# Patient Record
Sex: Female | Born: 1935 | Race: White | Hispanic: No | Smoking: Former smoker
Health system: Southern US, Community
[De-identification: ages and names within clinical notes are randomized; demographics above are authoritative.]

## PROBLEM LIST (undated history)

## (undated) DIAGNOSIS — K317 Polyp of stomach and duodenum: Secondary | ICD-10-CM

## (undated) DIAGNOSIS — K648 Other hemorrhoids: Secondary | ICD-10-CM

## (undated) DIAGNOSIS — E669 Obesity, unspecified: Secondary | ICD-10-CM

## (undated) DIAGNOSIS — K635 Polyp of colon: Secondary | ICD-10-CM

## (undated) DIAGNOSIS — Z950 Presence of cardiac pacemaker: Secondary | ICD-10-CM

## (undated) DIAGNOSIS — K5790 Diverticulosis of intestine, part unspecified, without perforation or abscess without bleeding: Secondary | ICD-10-CM

## (undated) DIAGNOSIS — N182 Chronic kidney disease, stage 2 (mild): Secondary | ICD-10-CM

## (undated) DIAGNOSIS — C689 Malignant neoplasm of urinary organ, unspecified: Secondary | ICD-10-CM

## (undated) DIAGNOSIS — I495 Sick sinus syndrome: Secondary | ICD-10-CM

## (undated) DIAGNOSIS — F32A Depression, unspecified: Secondary | ICD-10-CM

## (undated) DIAGNOSIS — N1330 Unspecified hydronephrosis: Secondary | ICD-10-CM

## (undated) DIAGNOSIS — I2510 Atherosclerotic heart disease of native coronary artery without angina pectoris: Secondary | ICD-10-CM

## (undated) DIAGNOSIS — R0600 Dyspnea, unspecified: Secondary | ICD-10-CM

## (undated) DIAGNOSIS — G43909 Migraine, unspecified, not intractable, without status migrainosus: Secondary | ICD-10-CM

## (undated) DIAGNOSIS — K279 Peptic ulcer, site unspecified, unspecified as acute or chronic, without hemorrhage or perforation: Secondary | ICD-10-CM

## (undated) DIAGNOSIS — I10 Essential (primary) hypertension: Secondary | ICD-10-CM

## (undated) DIAGNOSIS — R5383 Other fatigue: Secondary | ICD-10-CM

## (undated) DIAGNOSIS — I509 Heart failure, unspecified: Secondary | ICD-10-CM

## (undated) DIAGNOSIS — E119 Type 2 diabetes mellitus without complications: Secondary | ICD-10-CM

## (undated) DIAGNOSIS — K219 Gastro-esophageal reflux disease without esophagitis: Secondary | ICD-10-CM

## (undated) DIAGNOSIS — C541 Malignant neoplasm of endometrium: Secondary | ICD-10-CM

## (undated) DIAGNOSIS — I4891 Unspecified atrial fibrillation: Secondary | ICD-10-CM

## (undated) DIAGNOSIS — Z8719 Personal history of other diseases of the digestive system: Secondary | ICD-10-CM

## (undated) DIAGNOSIS — E1110 Type 2 diabetes mellitus with ketoacidosis without coma: Secondary | ICD-10-CM

## (undated) DIAGNOSIS — I209 Angina pectoris, unspecified: Secondary | ICD-10-CM

## (undated) DIAGNOSIS — E7800 Pure hypercholesterolemia, unspecified: Secondary | ICD-10-CM

## (undated) DIAGNOSIS — I82409 Acute embolism and thrombosis of unspecified deep veins of unspecified lower extremity: Secondary | ICD-10-CM

## (undated) DIAGNOSIS — M1990 Unspecified osteoarthritis, unspecified site: Secondary | ICD-10-CM

## (undated) DIAGNOSIS — D649 Anemia, unspecified: Secondary | ICD-10-CM

## (undated) DIAGNOSIS — F329 Major depressive disorder, single episode, unspecified: Secondary | ICD-10-CM

## (undated) DIAGNOSIS — G8929 Other chronic pain: Secondary | ICD-10-CM

## (undated) DIAGNOSIS — M549 Dorsalgia, unspecified: Secondary | ICD-10-CM

## (undated) DIAGNOSIS — J189 Pneumonia, unspecified organism: Secondary | ICD-10-CM

## (undated) DIAGNOSIS — R0609 Other forms of dyspnea: Secondary | ICD-10-CM

## (undated) DIAGNOSIS — F439 Reaction to severe stress, unspecified: Secondary | ICD-10-CM

## (undated) DIAGNOSIS — I639 Cerebral infarction, unspecified: Secondary | ICD-10-CM

## (undated) DIAGNOSIS — Z8679 Personal history of other diseases of the circulatory system: Secondary | ICD-10-CM

## (undated) HISTORY — DX: Obesity, unspecified: E66.9

## (undated) HISTORY — DX: Reaction to severe stress, unspecified: F43.9

## (undated) HISTORY — DX: Diverticulosis of intestine, part unspecified, without perforation or abscess without bleeding: K57.90

## (undated) HISTORY — DX: Other fatigue: R53.83

## (undated) HISTORY — DX: Heart failure, unspecified: I50.9

## (undated) HISTORY — DX: Atherosclerotic heart disease of native coronary artery without angina pectoris: I25.10

## (undated) HISTORY — DX: Polyp of colon: K63.5

## (undated) HISTORY — PX: DILATION AND CURETTAGE OF UTERUS: SHX78

## (undated) HISTORY — PX: CARDIAC CATHETERIZATION: SHX172

## (undated) HISTORY — PX: TONSILLECTOMY: SUR1361

## (undated) HISTORY — PX: CHOLECYSTECTOMY: SHX55

## (undated) HISTORY — DX: Polyp of stomach and duodenum: K31.7

## (undated) HISTORY — DX: Unspecified atrial fibrillation: I48.91

## (undated) HISTORY — DX: Peptic ulcer, site unspecified, unspecified as acute or chronic, without hemorrhage or perforation: K27.9

## (undated) HISTORY — PX: JOINT REPLACEMENT: SHX530

## (undated) HISTORY — DX: Sick sinus syndrome: I49.5

## (undated) HISTORY — DX: Essential (primary) hypertension: I10

---

## 1958-11-27 DIAGNOSIS — I82409 Acute embolism and thrombosis of unspecified deep veins of unspecified lower extremity: Secondary | ICD-10-CM

## 1958-11-27 HISTORY — DX: Acute embolism and thrombosis of unspecified deep veins of unspecified lower extremity: I82.409

## 1983-03-29 DIAGNOSIS — K279 Peptic ulcer, site unspecified, unspecified as acute or chronic, without hemorrhage or perforation: Secondary | ICD-10-CM

## 1983-03-29 HISTORY — DX: Peptic ulcer, site unspecified, unspecified as acute or chronic, without hemorrhage or perforation: K27.9

## 1993-03-28 DIAGNOSIS — C541 Malignant neoplasm of endometrium: Secondary | ICD-10-CM

## 1993-03-28 HISTORY — PX: VESICOVAGINAL FISTULA CLOSURE W/ TAH: SUR271

## 1993-03-28 HISTORY — PX: ABDOMINAL HYSTERECTOMY: SHX81

## 1993-03-28 HISTORY — DX: Malignant neoplasm of endometrium: C54.1

## 1997-10-03 ENCOUNTER — Ambulatory Visit (HOSPITAL_COMMUNITY): Admission: RE | Admit: 1997-10-03 | Discharge: 1997-10-03 | Payer: Self-pay | Admitting: Obstetrics and Gynecology

## 1997-11-12 ENCOUNTER — Ambulatory Visit: Admission: RE | Admit: 1997-11-12 | Discharge: 1997-11-12 | Payer: Self-pay | Admitting: Gynecology

## 1998-05-22 ENCOUNTER — Other Ambulatory Visit: Admission: RE | Admit: 1998-05-22 | Discharge: 1998-05-22 | Payer: Self-pay | Admitting: Obstetrics and Gynecology

## 1998-10-30 ENCOUNTER — Encounter: Payer: Self-pay | Admitting: Obstetrics and Gynecology

## 1998-10-30 ENCOUNTER — Ambulatory Visit (HOSPITAL_COMMUNITY): Admission: RE | Admit: 1998-10-30 | Discharge: 1998-10-30 | Payer: Self-pay | Admitting: Obstetrics and Gynecology

## 1998-11-24 ENCOUNTER — Ambulatory Visit: Admission: RE | Admit: 1998-11-24 | Discharge: 1998-11-24 | Payer: Self-pay | Admitting: Gynecology

## 1998-11-24 ENCOUNTER — Other Ambulatory Visit: Admission: RE | Admit: 1998-11-24 | Discharge: 1998-11-24 | Payer: Self-pay | Admitting: Gynecology

## 1999-02-01 ENCOUNTER — Encounter: Admission: RE | Admit: 1999-02-01 | Discharge: 1999-05-02 | Payer: Self-pay | Admitting: Internal Medicine

## 1999-05-31 ENCOUNTER — Other Ambulatory Visit: Admission: RE | Admit: 1999-05-31 | Discharge: 1999-05-31 | Payer: Self-pay | Admitting: Obstetrics and Gynecology

## 1999-07-23 ENCOUNTER — Ambulatory Visit (HOSPITAL_COMMUNITY): Admission: RE | Admit: 1999-07-23 | Discharge: 1999-07-24 | Payer: Self-pay | Admitting: Internal Medicine

## 1999-07-23 ENCOUNTER — Encounter: Payer: Self-pay | Admitting: Internal Medicine

## 1999-07-23 HISTORY — PX: INSERT / REPLACE / REMOVE PACEMAKER: SUR710

## 1999-07-24 ENCOUNTER — Encounter: Payer: Self-pay | Admitting: Internal Medicine

## 1999-10-06 ENCOUNTER — Encounter: Payer: Self-pay | Admitting: Internal Medicine

## 1999-10-06 ENCOUNTER — Encounter: Admission: RE | Admit: 1999-10-06 | Discharge: 1999-10-06 | Payer: Self-pay | Admitting: Internal Medicine

## 1999-11-04 ENCOUNTER — Ambulatory Visit (HOSPITAL_COMMUNITY): Admission: RE | Admit: 1999-11-04 | Discharge: 1999-11-05 | Payer: Self-pay | Admitting: Surgery

## 1999-12-24 ENCOUNTER — Ambulatory Visit (HOSPITAL_COMMUNITY): Admission: RE | Admit: 1999-12-24 | Discharge: 1999-12-24 | Payer: Self-pay | Admitting: Obstetrics and Gynecology

## 1999-12-24 ENCOUNTER — Encounter: Payer: Self-pay | Admitting: Obstetrics and Gynecology

## 2000-06-12 ENCOUNTER — Other Ambulatory Visit: Admission: RE | Admit: 2000-06-12 | Discharge: 2000-06-12 | Payer: Self-pay | Admitting: Obstetrics and Gynecology

## 2001-02-23 ENCOUNTER — Encounter: Payer: Self-pay | Admitting: Obstetrics and Gynecology

## 2001-02-23 ENCOUNTER — Ambulatory Visit (HOSPITAL_COMMUNITY): Admission: RE | Admit: 2001-02-23 | Discharge: 2001-02-23 | Payer: Self-pay | Admitting: Obstetrics and Gynecology

## 2001-07-23 ENCOUNTER — Other Ambulatory Visit: Admission: RE | Admit: 2001-07-23 | Discharge: 2001-07-23 | Payer: Self-pay | Admitting: Obstetrics and Gynecology

## 2002-02-22 ENCOUNTER — Encounter: Payer: Self-pay | Admitting: Obstetrics and Gynecology

## 2002-02-22 ENCOUNTER — Ambulatory Visit (HOSPITAL_COMMUNITY): Admission: RE | Admit: 2002-02-22 | Discharge: 2002-02-22 | Payer: Self-pay | Admitting: Obstetrics and Gynecology

## 2002-08-15 ENCOUNTER — Other Ambulatory Visit: Admission: RE | Admit: 2002-08-15 | Discharge: 2002-08-15 | Payer: Self-pay | Admitting: Obstetrics and Gynecology

## 2003-03-25 ENCOUNTER — Ambulatory Visit (HOSPITAL_COMMUNITY): Admission: RE | Admit: 2003-03-25 | Discharge: 2003-03-25 | Payer: Self-pay | Admitting: Obstetrics and Gynecology

## 2003-08-18 ENCOUNTER — Other Ambulatory Visit: Admission: RE | Admit: 2003-08-18 | Discharge: 2003-08-18 | Payer: Self-pay | Admitting: Obstetrics and Gynecology

## 2003-08-27 ENCOUNTER — Encounter: Admission: RE | Admit: 2003-08-27 | Discharge: 2003-08-27 | Payer: Self-pay | Admitting: Orthopaedic Surgery

## 2004-03-30 ENCOUNTER — Ambulatory Visit (HOSPITAL_COMMUNITY): Admission: RE | Admit: 2004-03-30 | Discharge: 2004-03-30 | Payer: Self-pay | Admitting: Obstetrics and Gynecology

## 2004-10-11 ENCOUNTER — Other Ambulatory Visit: Admission: RE | Admit: 2004-10-11 | Discharge: 2004-10-11 | Payer: Self-pay | Admitting: Obstetrics and Gynecology

## 2004-10-14 ENCOUNTER — Emergency Department (HOSPITAL_COMMUNITY): Admission: EM | Admit: 2004-10-14 | Discharge: 2004-10-15 | Payer: Self-pay | Admitting: Emergency Medicine

## 2005-04-11 ENCOUNTER — Ambulatory Visit (HOSPITAL_COMMUNITY): Admission: RE | Admit: 2005-04-11 | Discharge: 2005-04-11 | Payer: Self-pay | Admitting: Obstetrics and Gynecology

## 2005-12-05 ENCOUNTER — Other Ambulatory Visit: Admission: RE | Admit: 2005-12-05 | Discharge: 2005-12-05 | Payer: Self-pay | Admitting: Obstetrics & Gynecology

## 2006-04-20 ENCOUNTER — Ambulatory Visit (HOSPITAL_COMMUNITY): Admission: RE | Admit: 2006-04-20 | Discharge: 2006-04-20 | Payer: Self-pay | Admitting: Obstetrics & Gynecology

## 2007-03-30 ENCOUNTER — Other Ambulatory Visit: Admission: RE | Admit: 2007-03-30 | Discharge: 2007-03-30 | Payer: Self-pay | Admitting: Obstetrics and Gynecology

## 2007-04-25 ENCOUNTER — Ambulatory Visit (HOSPITAL_COMMUNITY): Admission: RE | Admit: 2007-04-25 | Discharge: 2007-04-25 | Payer: Self-pay | Admitting: Obstetrics & Gynecology

## 2007-06-27 ENCOUNTER — Ambulatory Visit: Payer: Self-pay | Admitting: Internal Medicine

## 2007-08-22 ENCOUNTER — Telehealth: Payer: Self-pay | Admitting: Internal Medicine

## 2007-08-22 DIAGNOSIS — K625 Hemorrhage of anus and rectum: Secondary | ICD-10-CM | POA: Insufficient documentation

## 2007-08-22 DIAGNOSIS — R1319 Other dysphagia: Secondary | ICD-10-CM

## 2007-08-27 ENCOUNTER — Telehealth (INDEPENDENT_AMBULATORY_CARE_PROVIDER_SITE_OTHER): Payer: Self-pay | Admitting: *Deleted

## 2007-09-17 ENCOUNTER — Ambulatory Visit: Payer: Self-pay | Admitting: Internal Medicine

## 2007-09-25 ENCOUNTER — Encounter: Payer: Self-pay | Admitting: Internal Medicine

## 2007-09-25 ENCOUNTER — Ambulatory Visit (HOSPITAL_COMMUNITY): Admission: RE | Admit: 2007-09-25 | Discharge: 2007-09-25 | Payer: Self-pay | Admitting: Internal Medicine

## 2007-09-25 ENCOUNTER — Ambulatory Visit: Payer: Self-pay | Admitting: Internal Medicine

## 2007-09-26 ENCOUNTER — Encounter: Payer: Self-pay | Admitting: Internal Medicine

## 2007-09-27 ENCOUNTER — Encounter: Payer: Self-pay | Admitting: Internal Medicine

## 2008-02-26 HISTORY — PX: TOTAL HIP ARTHROPLASTY: SHX124

## 2008-04-22 ENCOUNTER — Inpatient Hospital Stay (HOSPITAL_COMMUNITY): Admission: RE | Admit: 2008-04-22 | Discharge: 2008-04-25 | Payer: Self-pay | Admitting: Orthopedic Surgery

## 2008-12-17 ENCOUNTER — Ambulatory Visit (HOSPITAL_COMMUNITY): Admission: RE | Admit: 2008-12-17 | Discharge: 2008-12-17 | Payer: Self-pay | Admitting: Obstetrics & Gynecology

## 2009-02-25 ENCOUNTER — Encounter: Payer: Self-pay | Admitting: Internal Medicine

## 2009-03-04 ENCOUNTER — Ambulatory Visit: Payer: Self-pay | Admitting: Internal Medicine

## 2009-03-04 DIAGNOSIS — Z860100 Personal history of colon polyps, unspecified: Secondary | ICD-10-CM | POA: Insufficient documentation

## 2009-03-04 DIAGNOSIS — K219 Gastro-esophageal reflux disease without esophagitis: Secondary | ICD-10-CM

## 2009-03-04 DIAGNOSIS — D689 Coagulation defect, unspecified: Secondary | ICD-10-CM

## 2009-03-04 DIAGNOSIS — D509 Iron deficiency anemia, unspecified: Secondary | ICD-10-CM | POA: Insufficient documentation

## 2009-03-04 DIAGNOSIS — E119 Type 2 diabetes mellitus without complications: Secondary | ICD-10-CM | POA: Insufficient documentation

## 2009-03-24 ENCOUNTER — Ambulatory Visit (HOSPITAL_COMMUNITY): Admission: RE | Admit: 2009-03-24 | Discharge: 2009-03-24 | Payer: Self-pay | Admitting: Internal Medicine

## 2009-04-07 ENCOUNTER — Telehealth: Payer: Self-pay | Admitting: Internal Medicine

## 2009-05-15 ENCOUNTER — Telehealth: Payer: Self-pay | Admitting: Internal Medicine

## 2009-05-19 ENCOUNTER — Ambulatory Visit: Payer: Self-pay | Admitting: Internal Medicine

## 2009-05-19 ENCOUNTER — Ambulatory Visit (HOSPITAL_COMMUNITY): Admission: RE | Admit: 2009-05-19 | Discharge: 2009-05-19 | Payer: Self-pay | Admitting: Internal Medicine

## 2009-05-19 DIAGNOSIS — K635 Polyp of colon: Secondary | ICD-10-CM

## 2009-05-19 HISTORY — DX: Polyp of colon: K63.5

## 2009-05-21 ENCOUNTER — Encounter: Payer: Self-pay | Admitting: Internal Medicine

## 2009-12-18 ENCOUNTER — Ambulatory Visit (HOSPITAL_COMMUNITY): Admission: RE | Admit: 2009-12-18 | Discharge: 2009-12-18 | Payer: Self-pay | Admitting: Obstetrics and Gynecology

## 2009-12-21 ENCOUNTER — Encounter: Payer: Self-pay | Admitting: Internal Medicine

## 2009-12-21 ENCOUNTER — Ambulatory Visit: Payer: Self-pay | Admitting: Cardiovascular Disease

## 2010-01-05 ENCOUNTER — Encounter: Payer: Self-pay | Admitting: Internal Medicine

## 2010-03-03 ENCOUNTER — Ambulatory Visit: Payer: Self-pay | Admitting: Internal Medicine

## 2010-03-03 DIAGNOSIS — I4891 Unspecified atrial fibrillation: Secondary | ICD-10-CM

## 2010-03-03 DIAGNOSIS — Z950 Presence of cardiac pacemaker: Secondary | ICD-10-CM

## 2010-03-28 HISTORY — PX: INSERT / REPLACE / REMOVE PACEMAKER: SUR710

## 2010-04-18 ENCOUNTER — Encounter: Payer: Self-pay | Admitting: Obstetrics and Gynecology

## 2010-04-29 NOTE — Letter (Signed)
Summary: [facility] Progress Note   [facility] Progress Note   Imported By: Roderic Ovens 03/09/2010 15:46:25  _____________________________________________________________________  External Attachment:    Type:   Image     Comment:   External Document

## 2010-04-29 NOTE — Procedures (Signed)
Summary: HOLD letter/[facility]  HOLD letter/[facility]   Imported By: Lester Little Hocking 04/20/2009 13:47:01  _____________________________________________________________________  External Attachment:    Type:   Image     Comment:   External Document

## 2010-04-29 NOTE — Miscellaneous (Signed)
Summary: RECall  Clinical Lists Changes  Observations: Added new observation of COLONNXTDUE: 04/2011 (05/21/2009 15:05)

## 2010-04-29 NOTE — Letter (Signed)
Summary: Patient Notice- Polyp Results  [facility]  [address] [place], [place] [address]   Phone: [phone]  Fax: [phone]        May 21, 2009 MRN: [id number]    PREZLEY QADIR [address] [place], [place]  [address]    Dear Ms. Greenhalgh,  I am pleased to inform you that the colon polyp(s) removed during your recent colonoscopy was (were) found to be benign (no cancer detected) upon pathologic examination.  I recommend you have a repeat colonoscopy examination in 2 years to look for recurrent polyps, as having colon polyps increases your risk for having recurrent polyps or even colon cancer in the future.  Should you develop new or worsening symptoms of abdominal pain, bowel habit changes or bleeding from the rectum or bowels, please schedule an evaluation with either your primary care physician or with me.  Additional information/recommendations:  __ No further action with gastroenterology is needed at this time. Please      follow-up with your primary care physician for your other healthcare      needs.   Please call us if you are having persistent problems or have questions about your condition that have not been fully answered at this time.  Sincerely,  Hilarie Fredrickson MD  This letter has been electronically signed by your physician.  Appended Document: Patient Notice- Polyp Results   Letter mailed to pt.

## 2010-04-29 NOTE — Miscellaneous (Signed)
Summary: Device preload  Clinical Lists Changes  Observations: Added new observation of PPM INDICATN: A-flutter (01/05/2010 8:30) Added new observation of MAGNET RTE: BOL 85 ERI 65 (01/05/2010 8:30) Added new observation of PPMLEADSTAT1: active (01/05/2010 8:30) Added new observation of PPMLEADSER1: [id number] (01/05/2010 8:30) Added new observation of PPMLEADMOD1: 5092  (01/05/2010 8:30) Added new observation of PPMLEADDOI1: 07/23/1999  (01/05/2010 8:30) Added new observation of PPMLEADLOC1: RV  (01/05/2010 8:30) Added new observation of PPM IMP MD: Villa Herb  (01/05/2010 8:30) Added new observation of PPM DOI: 07/23/1999  (01/05/2010 8:30) Added new observation of PPM SERL#: [id number]  (01/05/2010 8:30) Added new observation of PPM MODL#: 303B  (01/05/2010 8:30) Added new observation of PACEMAKERMFG: Medtronic  (01/05/2010 8:30) Added new observation of PPM REFER MD: Shaune Pascal, MD  (01/05/2010 8:30) Added new observation of PACEMAKER MD: Lewayne Bunting, MD  (01/05/2010 8:30)      PPM Specifications Following MD:  Lewayne Bunting, MD     Referring MD:  Shaune Pascal, MD PPM Vendor:  Medtronic     PPM Model Number:  303B     PPM Serial Number:  [id number] PPM DOI:  07/23/1999     PPM Implanting MD:  Villa Herb  Lead 1    Location: RV     DOI: 07/23/1999     Model #: [id number]     Serial #: [id number]     Status: active  Magnet Response Rate:  BOL 85 ERI 65  Indications:  A-flutter

## 2010-04-29 NOTE — Progress Notes (Signed)
Summary: Triage  Phone Note Call from Patient Call back at Home Phone [phone]   Caller: Patient Call For: Dr. Marina Goodell Reason for Call: Talk to Nurse Summary of Call: Pt has some questions about her procedure on Tuesday...re: Insulin questions Initial call taken by: Karna Christmas,  May 15, 2009 2:38 PM  Follow-up for Phone Call         Insulin  instructions clarified for pt. and she had been on an antibiotic for UTI from Dr. Jacky Kindle  and told she needs to take as prescribed as it will not interfere with her procedure. Follow-up by: Teryl Lucy RN,  May 15, 2009 3:26 PM

## 2010-04-29 NOTE — Progress Notes (Signed)
Summary: Reschedule Hospital Procedure  Phone Note Call from Patient Call back at Home Phone [phone]   Caller: Patient Call For: Dr. Marina Goodell Reason for Call: Talk to Nurse Summary of Call: Pt is needing to reschedule her procedure at [facility] that is scheduled for tomorrow 04-08-09 Initial call taken by: Karna Christmas,  April 07, 2009 11:08 AM  Follow-up for Phone Call        Patient  is unable to get here in the am due to transportation.  She wants to reschedule for several weeks out due to her coumadin.  I will have Teryl Lucy call her back to reschedule. Follow-up by: Darcey Nora RN, CGRN,  April 07, 2009 11:18 AM  Additional Follow-up for Phone Call Additional follow up Details #1::        Pt. contacted and informed that I will r/s her for her EGD/Dil. during Dr .Lamar Sprinkles next hospital week in February and that is fine with her.   Teryl Lucy RN  April 08, 2009 11:31 AM     Additional Follow-up for Phone Call Additional follow up Details #2::    Pt.scheduled for procedure and anti-coagulation letter sent to Dr.Nashar as other letter can not be located.  Teryl Lucy RN  April 14, 2009 9:02 AM  Letter rec'd from Dr.Nasher  approving holding Coumadin 5 days prior to procedure on2/22/2011. Pt. informed. Follow-up by: Teryl Lucy RN,  April 15, 2009 9:20 AM

## 2010-04-29 NOTE — Procedures (Signed)
Summary: Colonoscopy  Patient: Chamaine Stankus Note: All result statuses are Final unless otherwise noted.  Tests: (1) Colonoscopy (COL)   COL Colonoscopy           DONE     [facility]     [address] [place], [place]  [address]           COLONOSCOPY PROCEDURE REPORT           PATIENT:  [name], [name]  MR#:  [id number]     BIRTHDATE:  09/01/1935, 73 yrs. old  GENDER:  female           ENDOSCOPIST:  Wilhemina Bonito. Eda Keys, MD     Referred by:  Geoffry Paradise, M.D.           PROCEDURE DATE:  05/19/2009     PROCEDURE:  Colonoscopy with snare polypectomy x 4     ASA CLASS:  Class III     INDICATIONS:  Iron Deficiency Anemia           MEDICATIONS:   Fentanyl 100 mcg IV, Versed 10 mg IV           DESCRIPTION OF PROCEDURE:   After the risks benefits and     alternatives of the procedure were thoroughly explained, informed     consent was obtained.  Digital rectal exam was performed and     revealed no abnormalities.   The Pentax Colonoscope [id number]     endoscope was introduced through the anus and advanced to the     cecum, which was identified by both the appendix and ileocecal     valve, limited by inability to hold air. Time to cecum = .     The quality of the prep was excellent, using MoviPrep.  The     instrument was then slowly withdrawn (T=24min) as the colon was     fully examined.     <<PROCEDUREIMAGES>>           FINDINGS:  The terminal ileum appeared normal.  Four polyps were     found in the cecum (54mm,3mm) and ascending colon (8mm, 4mm).     Polyps were snared without cautery. Retrieval was successful.     Moderate diverticulosis was found in the left colon.  A tiny     nonbleeding A.V. malformation was found in the cecum.     Retroflexed views in the rectum revealed moderate internal     hemorrhoids.    The scope was then withdrawn from the patient and     the procedure completed.           COMPLICATIONS:  None           ENDOSCOPIC IMPRESSION:   1) Normal terminal ileum     2) Four polyps - removed     3) Moderate diverticulosis in the left colon     4) Small Av malformation in the cecum (possible cause for     anemia)     5) Moderate Internal hemorrhoids           RECOMMENDATIONS:     1) Follow up colonoscopy in 2 years     2) EGD today           ______________________________     Wilhemina Bonito. Eda Keys, MD           CC:  Geoffry Paradise, MD;The Patient  n.     eSIGNED:   Wilhemina Bonito. Eda Keys at 05/19/2009 09:34 AM           Lanier Clam, [id number]  Note: An exclamation mark (!) indicates a result that was not dispersed into the flowsheet. Document Creation Date: 05/19/2009 9:35 AM _______________________________________________________________________  (1) Order result status: Final Collection or observation date-time: 05/19/2009 09:23 Requested date-time:  Receipt date-time:  Reported date-time:  Referring Physician:   Ordering Physician: Fransico Setters [id number]) Specimen Source:  Source: Launa Grill Order Number: [id number] Lab site:

## 2010-04-29 NOTE — Procedures (Signed)
Summary: Upper Endoscopy  Patient: Joyce Terry Note: All result statuses are Final unless otherwise noted.  Tests: (1) Upper Endoscopy (EGD)   EGD Upper Endoscopy       DONE     [facility]     [address] [place], [place]  [address]           ENDOSCOPY PROCEDURE REPORT           PATIENT:  Joyce Terry, Joyce Terry  MR#:  [id number]     BIRTHDATE:  03/28/1936, 73 yrs. old  GENDER:  female           ENDOSCOPIST:  Wilhemina Bonito. Eda Keys, MD     Referred by:  Geoffry Paradise, M.D.           PROCEDURE DATE:  05/19/2009     PROCEDURE:  EGD     Elease Hashimoto Dilation of Esophagus -     68F     ASA CLASS:  Class III     INDICATIONS:  iron deficiency anemia, dysphagia           MEDICATIONS:   There was residual sedation effect present from     prior procedure., Fentanyl 50 mcg, Versed 4 mg     TOPICAL ANESTHETIC:  Cetacaine Spray           DESCRIPTION OF PROCEDURE:   After the risks benefits and     alternatives of the procedure were thoroughly explained, informed     consent was obtained.  The Pentax Gastroscope [id number] endoscope     was introduced through the mouth and advanced to the second     portion of the duodenum, without limitations.  The instrument was     slowly withdrawn as the mucosa was fully examined.     <<PROCEDUREIMAGES>>           A large caliber esophageal ring was found in the distal esophagus.     The upper, middle, and distal third of the esophagus were     carefully inspected and no additional abnormalities were noted.     The z-line was well seen at the GEJ. The endoscope was pushed into     the fundus which was normal including a retroflexed view. Benign     gastric polyps in the fundus noted.The antrum,gastric body, first     and second part of the duodenum were unremarkable.    Retroflexed     views revealed no additional abnormalities.    The scope was then     withdrawn from the patient and the procedure completed.           THERAPY: MALONEY DILATION  W/ 68F DILATOR - MINIMAL RESISTANCE. NO     HEME. TOLERATED WELL           COMPLICATIONS:  None           ENDOSCOPIC IMPRESSION:     1) Ring, esophageal in the distal esophagus - S/P 68F MALONEY     DILATION     2) A FEW SMALL GASTRIC POLYPS     2) OTHERWISE Normal EGD           RECOMMENDATIONS:     1) Clear liquids until 12 NOON, then soft foods rest of day.     Resume prior diet tomorrow.     2) CONTINE IRON TWICE DAILY EVERY DAY     3) RESUME COUMADIN THERAPY TOMORROW     4)  HAVE DR ARONSON CHECK YOUR BLOOD COUNTS IN 4 WEEKS TO SEE IF     IRON IS WORKING           ______________________________     Wilhemina Bonito. Eda Keys, MD           CC:  Geoffry Paradise, MD, The Patient           n.     eSIGNED:   Wilhemina Bonito. Eda Keys at 05/19/2009 09:55 AM           Lanier Clam, [id number]  Note: An exclamation mark (!) indicates a result that was not dispersed into the flowsheet. Document Creation Date: 05/19/2009 9:56 AM _______________________________________________________________________  (1) Order result status: Final Collection or observation date-time: 05/19/2009 09:43 Requested date-time:  Receipt date-time:  Reported date-time:  Referring Physician:   Ordering Physician: Fransico Setters [phone]) Specimen Source:  Source: Launa Grill Order Number: [id number] Lab site:

## 2010-04-29 NOTE — Assessment & Plan Note (Signed)
Summary: pacer check/medtronic   Visit Type:  Follow-up Referring Provider:  n/a Primary Provider:  Pearletha Furl. Jacky Kindle, MD   History of Present Illness: Joyce Terry returns today after a long absence from our EP clinic.  [name], [name].  She has done well with her biggest problems most recently being due to low back pain.  She does have class 2 dyspnea which I think is multifactorial.  Current Medications (verified): 1)  Lantus 100 Unit/ml Soln (Insulin Glargine) .... 30 Units At Bedtime 2)  Glyburide-Metformin 5-500 Mg Tabs (Glyburide-Metformin) .... 2 Tablets Two Times A Day 3)  Novolog Flexpen 100 Unit/ml Soln (Insulin Aspart) .... Take As Needed 4)  Aspirin 81 Mg Tabs (Aspirin) .Marland Kitchen.. 1 Tablet By Mouth Once Daily 5)  Torsemide 20 Mg Tabs (Torsemide) .Marland Kitchen.. 1 Tablet By Mouth Once Daily 6)  Benazepril Hcl 10 Mg Tabs (Benazepril Hcl) .Marland Kitchen.. 1 Tablet By Mouth Once Daily 7)  Nexium 40 Mg Cpdr (Esomeprazole Magnesium) .Marland Kitchen.. 1 Capsule By Mouth Two Times A Day 8)  Warfarin Sodium 5 Mg Tabs (Warfarin Sodium) .... Take As Directed 9)  Vytorin 10-20 Mg Tabs (Ezetimibe-Simvastatin) .... Take 1 Tablet By Mouth Once Daily 10)  Advair Diskus 250-50 Mcg/dose Aepb (Fluticasone-Salmeterol) .Marland Kitchen.. 1 Tablet By Mouth Two Times A Day 11)  Ambien 10 Mg Tabs (Zolpidem Tartrate) .Marland Kitchen.. 1 Tablet By Mouth At Bedtime Sleep 12)  Fentanyl 50 Mcg/hr Pt72 (Fentanyl) .... Change Every 72 Hours 13)  Oxycodone Hcl 10 Mg Tabs (Oxycodone Hcl) .Marland Kitchen.. 1 Tablet 5 Times Daily As Needed Pain 14)  Iferex 150 Forte 150-25-1 Mg-Mcg-Mg Caps (Iron Polysacch Cmplx-B12-Fa) .Marland Kitchen.. 1 Capsule By Mouth Two Times A Day 15)  Lexapro 10 Mg Tabs (Escitalopram Oxalate) .... Once Daily 16)  Carvedilol 3.125 Mg Tabs (Carvedilol) .... Take One Tablet By Mouth Twice A Day 17)  Alprazolam 0.25 Mg Tabs (Alprazolam) .... Prn  Allergies: 1)  !  Penicillin 2)  ! * Codiene  Past History:  Past Medical History: Last updated: 03/04/2009 Diabetes GERD Hypertension Spinal Stenosis  Hital Hernia  Heart Faliure Atrial Fibrillation Endometrial Cancer Colon Polyp  Iron Anemia Deficency  Arthritis Asthma  Past Surgical History: Last updated: 03/04/2009 Cholecystectomy Heart Ablation and Pacemaker   Review of Systems  The patient denies chest pain, syncope, dyspnea on exertion, and peripheral edema.    Vital Signs:  Patient profile:   75 year old female Height:      68 inches Weight:      236 pounds BMI:     36.01 O2 Sat:      95 % Pulse rate:   72 / minute BP sitting:   98 / 62  (right arm)  Vitals Entered By: Laurance Flatten CMA (March 03, 2010 3:34 PM)  Physical Exam  General:  obese,Well developed, well nourished, no acute distress. Head:  Normocephalic and atraumatic. Eyes:  PERRLA, no icterus. Mouth:  No deformity or lesionsl. Neck:  Supple; no masses or thyromegaly. Chest Wall:  well healed PPM incision. Lungs:  Clear throughout to auscultation except for a mild end expiratory wheeze in the left lung Heart:  Regular rate and rhythm; no murmurs, rubs,  or bruits. Abdomen:  Soft, nontender and nondistended. No masses, hepatosplenomegaly or hernias noted. Normal bowel sounds. Msk:  Symmetrical with no gross deformities. Normal posture. Pulses:  Normal pulses noted. Extremities:  No clubbing, cyanosis, edema or  deformities noted. Neurologic:  Alert and  oriented x4;  grossly normal neurologically.   PPM Specifications Following MD:  Lewayne Bunting, MD     Referring MD:  Shaune Pascal, MD PPM Vendor:  Medtronic     PPM Model Number:  [id number]     PPM Serial Number:  [id number] PPM DOI:  07/23/1999     PPM Implanting MD:  Villa Herb  Lead 1    Location: RV     DOI: 07/23/1999     Model #: [id number]     Serial #: [id number]     Status: active  Magnet Response Rate:  BOL 85 ERI 65  Indications:   A-flutter   PPM Follow Up Remote Check?  No Battery Voltage:  2.71 V     Battery Est. Longevity:   14 months     Pacer Dependent:  Yes     Right Ventricle  Impedance: 611 ohms, Threshold: 0.5 V at 0.4 msec  Episodes Ventricular High Rate:  1     Ventricular Pacing:  100%  Parameters Mode:  VVIR     Lower Rate Limit:  70     Upper Rate Limit:  120 Tech Comments:  high rate episode mid Aug 2011  approx 1 sec MD Comments:  agree with above.  ERI about 14 months.  Impression & Recommendations:  Problem # 1:  ATRIAL FIBRILLATION (ICD-427.31) Her ventricular rate appears to be well controlled.  Will recheck in several months. Her updated medication list for this problem includes:    Aspirin 81 Mg Tabs (Aspirin) .Marland Kitchen... 1 tablet by mouth once daily    Warfarin Sodium 5 Mg Tabs (Warfarin sodium) .Marland Kitchen... Take as directed    Carvedilol 3.125 Mg Tabs (Carvedilol) .Marland Kitchen... Take one tablet by mouth twice a day  Problem # 3:  CARDIAC PACEMAKER IN SITU (ICD-V45.01) Her device is workking normally.  Will follow.  Patient Instructions: 1)  Your physician recommends that you schedule a follow-up appointment in: 3 months with device clinic and 12 months with Dr Ladona Ridgel

## 2010-05-25 ENCOUNTER — Encounter (INDEPENDENT_AMBULATORY_CARE_PROVIDER_SITE_OTHER): Payer: Self-pay | Admitting: *Deleted

## 2010-06-03 NOTE — Letter (Signed)
Summary: Appointment - Reschedule  [facility], Main Office  [address]   [place], [place] [address]   Phone: [phone]  Fax: [phone]     May 25, 2010 MRN: [id number]   Joyce Terry [address] [place], [place]  [address]   Dear [name],   Due to a change in our office schedule, your appointment on  06-02-10  at  12:00p              must be changed.  It is very important that we reach you to reschedule this appointment. We look forward to participating in your health care needs. Please contact us at the number listed above at your earliest convenience to reschedule this appointment.     Sincerely,  Glass blower/designer

## 2010-06-07 ENCOUNTER — Encounter (INDEPENDENT_AMBULATORY_CARE_PROVIDER_SITE_OTHER): Payer: Medicare Other

## 2010-06-07 ENCOUNTER — Encounter: Payer: Self-pay | Admitting: Internal Medicine

## 2010-06-07 DIAGNOSIS — I495 Sick sinus syndrome: Secondary | ICD-10-CM

## 2010-06-11 ENCOUNTER — Ambulatory Visit (INDEPENDENT_AMBULATORY_CARE_PROVIDER_SITE_OTHER): Payer: Medicare Other | Admitting: Cardiovascular Disease

## 2010-06-11 DIAGNOSIS — Z950 Presence of cardiac pacemaker: Secondary | ICD-10-CM

## 2010-06-11 DIAGNOSIS — I2510 Atherosclerotic heart disease of native coronary artery without angina pectoris: Secondary | ICD-10-CM

## 2010-06-14 ENCOUNTER — Telehealth: Payer: Self-pay | Admitting: Internal Medicine

## 2010-06-15 NOTE — Procedures (Signed)
Summary: pcp  mca   Current Medications (verified): 1)  Lantus 100 Unit/ml Soln (Insulin Glargine) .... 30 Units At Bedtime 2)  Glyburide-Metformin 5-500 Mg Tabs (Glyburide-Metformin) .... 2 Tablets Two Times A Day 3)  Novolog Flexpen 100 Unit/ml Soln (Insulin Aspart) .... Take As Needed 4)  Aspirin 81 Mg Tabs (Aspirin) .Marland Kitchen.. 1 Tablet By Mouth Once Daily 5)  Torsemide 20 Mg Tabs (Torsemide) .Marland Kitchen.. 1 Tablet By Mouth Once Daily 6)  Benazepril Hcl 10 Mg Tabs (Benazepril Hcl) .Marland Kitchen.. 1 Tablet By Mouth Once Daily 7)  Nexium 40 Mg Cpdr (Esomeprazole Magnesium) .Marland Kitchen.. 1 Capsule By Mouth Once Daily 8)  Warfarin Sodium 5 Mg Tabs (Warfarin Sodium) .... Take As Directed 9)  Vytorin 10-20 Mg Tabs (Ezetimibe-Simvastatin) .... Take 1 Tablet By Mouth Once Daily 10)  Advair Diskus 250-50 Mcg/dose Aepb (Fluticasone-Salmeterol) .Marland Kitchen.. 1 Tablet By Mouth Two Times A Day 11)  Ambien 10 Mg Tabs (Zolpidem Tartrate) .... Take 1/2 Tablet By Mouth At Bedtime 12)  Fentanyl 50 Mcg/hr Pt72 (Fentanyl) .... Change Every 72 Hours 13)  Oxycodone Hcl 10 Mg Tabs (Oxycodone Hcl) .Marland Kitchen.. 1 Tablet 5 Times Daily As Needed Pain 14)  Iferex 150 Forte 150-25-1 Mg-Mcg-Mg Caps (Iron Polysacch Cmplx-B12-Fa) .Marland Kitchen.. 1 Capsule By Mouth Once Daily 15)  Lexapro 10 Mg Tabs (Escitalopram Oxalate) .... Once Daily 16)  Carvedilol 3.125 Mg Tabs (Carvedilol) .... Take One Tablet By Mouth Twice A Day 17)  Alprazolam 0.25 Mg Tabs (Alprazolam) .... Prn  Allergies (verified): 1)  ! Penicillin 2)  ! * Codiene  PPM Specifications Following MD:  Lewayne Bunting, MD     Referring MD:  Shaune Pascal, MD PPM Vendor:  Medtronic     PPM Model Number:  303B     PPM Serial Number:  [id number] PPM DOI:  07/23/1999     PPM Implanting MD:  Villa Herb  Lead 1    Location: RV     DOI: 07/23/1999     Model #: 4098     Serial #: [id number]     Status: active  Magnet Response Rate:  BOL 85 ERI 65  Indications:  A-flutter   PPM Follow Up Pacer Dependent:  Yes       Parameters Mode:  VVIR     Lower Rate Limit:  70     Upper Rate Limit:  120 Tech Comments:  meds reviewed. see paceart report. Vella Kohler  June 07, 2010 3:59 PM

## 2010-06-15 NOTE — Cardiovascular Report (Signed)
Summary: Office Visit   Office Visit   Imported By: Roderic Ovens 06/11/2010 15:34:04  _____________________________________________________________________  External Attachment:    Type:   Image     Comment:   External Document

## 2010-06-16 LAB — GLUCOSE, CAPILLARY: Glucose-Capillary: 222 mg/dL — ABNORMAL HIGH (ref 70–99)

## 2010-06-24 NOTE — Progress Notes (Signed)
Summary: Discuss mutual pt with Dr Marina Goodell  Phone Note From Other Clinic   Caller: Diane @ Dr Lanell Matar office [phone] Call For: Dr Marina Goodell Summary of Call: Dr Jacky Kindle would like to discuss mutual pt. I adviced her Dr Marina Goodell would be off the rest of the week & hospital MD for the following week. Still wanted to leave a written message for Dr Gwyneth Sprout to return his call whenever he was able to. Initial call taken by: Leanor Kail [facility],  June 14, 2010 4:38 PM  Follow-up for Phone Call        Dr. Marina Goodell please call Dr. Jacky Kindle regarding this patient when you return. Follow-up by: Selinda Michaels RN,  June 14, 2010 4:46 PM  Additional Follow-up for Phone Call Additional follow up Details #1::        OK Additional Follow-up by: Hilarie Fredrickson MD,  June 19, 2010 3:25 PM

## 2010-07-12 LAB — CBC
HCT: 37.3 % (ref 36.0–46.0)
Hemoglobin: 12.1 g/dL (ref 12.0–15.0)
Hemoglobin: 9.3 g/dL — ABNORMAL LOW (ref 12.0–15.0)
MCHC: 32.4 g/dL (ref 30.0–36.0)
MCV: 78.8 fL (ref 78.0–100.0)
MCV: 79.2 fL (ref 78.0–100.0)
Platelets: 256 10*3/uL (ref 150–400)
RBC: 3.61 MIL/uL — ABNORMAL LOW (ref 3.87–5.11)
RDW: 16.1 % — ABNORMAL HIGH (ref 11.5–15.5)
RDW: 16.4 % — ABNORMAL HIGH (ref 11.5–15.5)
WBC: 10.3 10*3/uL (ref 4.0–10.5)
WBC: 13.6 10*3/uL — ABNORMAL HIGH (ref 4.0–10.5)

## 2010-07-12 LAB — BASIC METABOLIC PANEL
BUN: 15 mg/dL (ref 6–23)
BUN: 17 mg/dL (ref 6–23)
CO2: 24 mEq/L (ref 19–32)
CO2: 28 mEq/L (ref 19–32)
CO2: 29 mEq/L (ref 19–32)
Calcium: 8.2 mg/dL — ABNORMAL LOW (ref 8.4–10.5)
Calcium: 8.7 mg/dL (ref 8.4–10.5)
Chloride: 102 mEq/L (ref 96–112)
Chloride: 103 mEq/L (ref 96–112)
Chloride: 104 mEq/L (ref 96–112)
Chloride: 107 mEq/L (ref 96–112)
Creatinine, Ser: 0.99 mg/dL (ref 0.4–1.2)
Creatinine, Ser: 1.23 mg/dL — ABNORMAL HIGH (ref 0.4–1.2)
Creatinine, Ser: 1.34 mg/dL — ABNORMAL HIGH (ref 0.4–1.2)
GFR calc Af Amer: 42 mL/min — ABNORMAL LOW (ref >60)
GFR calc Af Amer: 47 mL/min — ABNORMAL LOW (ref >60)
GFR calc Af Amer: >60 mL/min (ref >60)
GFR calc non Af Amer: 34 mL/min — ABNORMAL LOW (ref >60)
GFR calc non Af Amer: 57 mL/min — ABNORMAL LOW (ref >60)
Glucose, Bld: 191 mg/dL — ABNORMAL HIGH (ref 70–99)
Potassium: 4.6 mEq/L (ref 3.5–5.1)
Sodium: 137 mEq/L (ref 135–145)
Sodium: 143 mEq/L (ref 135–145)

## 2010-07-12 LAB — DIFFERENTIAL
Basophils Absolute: 0 10*3/uL (ref 0.0–0.1)
Eosinophils Absolute: 0.1 10*3/uL (ref 0.0–0.7)
Eosinophils Relative: 2 % (ref 0–5)
Lymphocytes Relative: 18 % (ref 12–46)
Lymphs Abs: 1.1 10*3/uL (ref 0.7–4.0)
Monocytes Absolute: 0.5 10*3/uL (ref 0.1–1.0)

## 2010-07-12 LAB — URINALYSIS, ROUTINE W REFLEX MICROSCOPIC
Glucose, UA: NEGATIVE mg/dL
Nitrite: NEGATIVE
Specific Gravity, Urine: 1.029 (ref 1.005–1.030)
pH: 6 (ref 5.0–8.0)

## 2010-07-12 LAB — GLUCOSE, CAPILLARY
Glucose-Capillary: 109 mg/dL — ABNORMAL HIGH (ref 70–99)
Glucose-Capillary: 172 mg/dL — ABNORMAL HIGH (ref 70–99)
Glucose-Capillary: 174 mg/dL — ABNORMAL HIGH (ref 70–99)
Glucose-Capillary: 182 mg/dL — ABNORMAL HIGH (ref 70–99)
Glucose-Capillary: 203 mg/dL — ABNORMAL HIGH (ref 70–99)
Glucose-Capillary: 215 mg/dL — ABNORMAL HIGH (ref 70–99)
Glucose-Capillary: 222 mg/dL — ABNORMAL HIGH (ref 70–99)
Glucose-Capillary: 233 mg/dL — ABNORMAL HIGH (ref 70–99)

## 2010-07-12 LAB — URINE MICROSCOPIC-ADD ON

## 2010-07-12 LAB — PROTIME-INR
INR: 1.4 (ref 0.00–1.49)
INR: 1.8 — ABNORMAL HIGH (ref 0.00–1.49)
INR: 2.5 — ABNORMAL HIGH (ref 0.00–1.49)
Prothrombin Time: 17.3 seconds — ABNORMAL HIGH (ref 11.6–15.2)

## 2010-07-12 LAB — ABO/RH: ABO/RH(D): A POS

## 2010-08-03 ENCOUNTER — Other Ambulatory Visit: Payer: Self-pay | Admitting: Cardiovascular Disease

## 2010-08-03 NOTE — Telephone Encounter (Signed)
Fax received from pharmacy. Refill completed. Jodette Alliah Boulanger RN  

## 2010-08-10 NOTE — Discharge Summary (Signed)
Joyce Terry, Joyce Terry               ACCOUNT NO.:  [id number]   MEDICAL RECORD NO.:  [id number]          PATIENT TYPE:  INP   LOCATION:  1605                         FACILITY:  [facility]   PHYSICIAN:  Madlyn Frankel. Charlann Boxer, M.D.  DATE OF BIRTH:  08/01/1935   DATE OF ADMISSION:  04/22/2008  DATE OF DISCHARGE:  04/25/2008                               DISCHARGE SUMMARY   ADMISSION DIAGNOSES:  1. Osteoarthritis.  2. Diabetes.  3. Heart failure.  4. Hypertension.  5. Spinal stenosis.  6. Hiatal hernia.  7. Atrial fibrillation with pacemaker.  8. Gastroesophageal reflux disease.  9. Endometrial cancer.   DISCHARGE DIAGNOSES:  1. Osteoarthritis.  2. Diabetes.  3. Heart failure.  4. Hypertension.  5. Spinal stenosis.  6. Hiatal hernia.  7. Atrial fibrillation with pacemaker.  8. Gastroesophageal reflux disease.  9. Endometrial cancer.  10.Postoperative hyperkalemia due to renal insufficiency, resolved      prior to discharge.  11.Renal insufficiency,R resolved prior to discharge.   HISTORY OF PRESENT ILLNESS:  Joyce Terry is a 75 year old female with  history of left hip pain secondary to osteoarthritis refractory to all  conservative treatment.  Also has a significant history of chronic back  pain with spinal stenosis treated by Dr. Ethelene Hal at [facility].   CONSULTANTS:  None.   LABORATORY DATA:  CBC:  Final reading white blood cells 13.6, hemoglobin  9.3, hematocrit 28.8, platelets 263,000.  Metabolic:  Sodium 134,  potassium 4.4, BUN 21, creatinine 0.99, glucose 146.  INR was 2.5.  Calcium 8.3.  Urinalysis showed many bacteria, being treated with Cipro.   RADIOLOGY:  Portable pelvis shows satisfactory appearance of left total  hip arthroplasty with no complicating features.  Chest, two-view, showed  cardiomegaly without pulmonary edema.   Cardiology showed a ventricular pacemaker.   HOSPITAL COURSE:  The patient underwent a left total hip replacement by  Dr. Durene Romans.  Assistant [employer] PA-C.  She did have her pacemaker  interrogated in the PACU postoperatively.  Normal device function.  All  electrical parameters were within normal limits.  No change from  previous session.  Dr. Jacky Kindle did make a social visit  while the  patient was in the hospital.  Orthopedically, she did have, due to  volume depletion, some elevated creatinine which resolved prior  discharge after being given normal saline bolus as well as some Lasix.  Her potassium elevated was but this also resolved as well.  Her dressing  was changed on a daily basis after postop day #1 with no significant  drainage from the wound postoperatively after day 2.  Physical therapy.  She was weightbearing as tolerated.  Made limited progress due to pain  and soreness as well as her chronic back soreness.  We saw on the  January 29 she did have some difficulty swallowing.  She felt like she  had a sore throat.  She reported no fevers or chills.  No previous  history of difficulty swallowing.  No previous history of any kind of  esophageal strictures.  Was given some Magic mouth wash as  well Cepacol  spray.  Throat was not swollen. No significant lymphadenopathy.  A  swallowing test unable to palpate any kind of thyroid nodule.  She had  not had a bowel movement so we gave her some laxatives to stimulate her  bowels although she was passing gas.  Based on her progress at this  point,  she is a skilled nurse facility rehab patient.   DISCHARGE DISPOSITION:  Discharged to skilled nurse facility rehab in  stable, improved condition.   ACTIVITY:  Physical therapy weightbearing as tolerated with use rolling  walker.   DISCHARGE DIET:  Heart-healthy.   WOUND CARE:  Keep dry.   DISCHARGE MEDICATIONS:  1. Coumadin to maintain INR between 2 and 3.  Will require close      monitoring as she has recently been on a fluoroquinolone for UTI.  2. Robaxin 500 mg p.o. q. 6h p.r.n. muscle spasm pain.  3.  Iron 325 mg p.o. t.i.d. for 3 weeks.  4. Colace 100 mg one p.o. b.i.d. p.r.n. constipation while on      narcotics.  5. MiraLax 17 grams one p.o. daily p.r.n. while on chronic narcotics.  6. Norco 7.5/325 one to two p.o. q. 46h p.r.n. pain.  7. Cipro 500 mg one p.o. b.i.d. through the end of April 27, 2008      and then discontinue.  8. Carvedilol 3.125 mg one p.o. b.i.d.  9. Benazepril 20 mg one p.o. q.a.m.  10.Furosemide 20 mg one p.o. q.a.m.  11.Nexium 40 mg one p.o. q.a.m.  12.Glyburide/metformin, 5/500 mg 2 tablets b.i.d.  13.Lantus insulin 40 units subcu at bedtime.  14.NovoLog FlexPen insulin as needed at meals.  15.Allegra 180 mg one p.o. q.a.m.  16.Lexapro 10 mg one p.o. q.a.m.  17.Vytorin 10/20 one p.o. q.p.m.  18.Advair discus 250/50 one daily as needed.  19.Calcium plus vitamin D 600/400 one p.o. daily.  20.Fentanyl  transdermal 25 mcg patch,  change every 3 days.  21.Alprazolam 0.25 mg one p.o. q. six to eight p.r.n. anxiety.   DISCHARGE FOLLOWUP:  Follow with Dr. Charlann Boxer at phone number [phone] in 2  weeks for wound check.  For any medical conditions follow up with Dr.  Geoffry Paradise.  Her cardiologist Dr. Elease Hashimoto.  Sees Dr. Ethelene Hal at  [facility] for chronic back pain.  Her cardiothoracic  surgeon is Dr. Ladona Ridgel.     ______________________________  Joyce Terry [address], [place]      Madlyn Frankel. Charlann Boxer, M.D.  Electronically Signed    BLM/MEDQ  D:  04/25/2008  T:  04/25/2008  Job:  [id number]   cc:   Geoffry Paradise, M.D.  Fax: [phone]   Vesta Mixer, M.D.  Fax: [phone]   Doylene Canning. Ladona Ridgel, MD  [address]  [address]  [place]  [place] [address]

## 2010-08-10 NOTE — Assessment & Plan Note (Signed)
[name]                         GASTROENTEROLOGY OFFICE NOTE   NAME:Joyce Terry, Joyce Terry                      MRN:          [id number]  DATE:06/27/2007                            DOB:          1935-12-01    REFERRING PHYSICIAN:  Geoffry Paradise, M.D.   REASON FOR CONSULTATION:  Isolated episode of rectal bleeding and  dysphagia.   HISTORY:  This is a pleasant but quite medically complicated 75 year old  white female who is referred through the courtesy of Dr. Jacky Kindle  regarding an isolated episode of rectal bleeding and chronic dysphagia.  The patient has a history of hypertension, cardiac dysrhythmia for which  she has undergone both ablation therapy and pacemaker placement,  congestive heart failure, insulin-requiring diabetes mellitus,  dyslipidemia, osteoarthritis, obesity, chronic back pain, endometrial  cancer, hysterectomy, and cholecystectomy.  Patient is also on chronic  systemic anticoagulation in the form of Coumadin.  She does have a  remote history of ulcer disease greater than 10 years ago.  She tells me  that she was diagnosed with Helicobacter pylori and subsequently  treated.  Aside from that, no GI history or evaluations.  She denies  having had colonoscopy.  She is not sure whether she had upper endoscopy  or an upper GI series.  In any event, she states that she was well until  May 19, 2007 when she noticed a moderate amount of blood in the  toilet bowl, in the stool, and on the tissue.  This was an isolated  occurrence.  There was no associated abdominal pain or rectal pain.  She  has had no change in bowel habits or weight loss.  She did go to the  Coumadin clinic, and her INR was therapeutic.  She continues on  Coumadin.  Next, she reports a two year history of intermittent problems  with solid and liquid food dysphagia.  This is particularly noticeable  with solids such as breads and peanut butter.  She does have  chronic  reflux disease for which she is maintained on Nexium.  On Nexium, no  heartburn or indigestion.  Off Nexium, significant symptoms.  As  mentioned earlier, she does have a hiatal hernia.  From a  cardiopulmonary standpoint, she has been stable in recent months.   PAST MEDICAL HISTORY:  1. Hypertension.  2. Atrial fibrillation, status post AV node ablation and pacemaker      placement.  3. Chronic Coumadin therapy.  4. Insulin-requiring diabetes mellitus.  5. Dyslipidemia.  6. Osteoarthritis.  7. Anxiety/depression.  8. Status post cholecystectomy.  9. Status post hysterectomy.  10.Remote history of peptic ulcer disease.   ALLERGIES:  1. PENICILLIN.  2. CODEINE.   CURRENT MEDICATIONS:  1. Carvedilol 3.25 mg b.i.d.  2. Oxycodone 5/325 mg q.6h. p.r.n.  3. Vytorin 10/20 mg daily.  4. Glyburide 5/500 mg 4 daily.  5. Benazepril 20 mg daily.  6. Fexofenadine 180 mg daily.  7. Warfarin 5 mg as directed.  8. Vitamin D.  9. Nexium 40 mg daily.  10.Lexapro 10 mg daily.  11.Furosemide 20 mg 4 times per week.  12.Lantus insulin  40 units subcu at night.  13.NovoLog insulin sliding scale.  14.Aspirin 81 mg daily.  15.Xanax 0.25 mg p.r.n.  16.Advair p.r.n.  17.Fentanyl patch 25 mg every three days for back pain.   FAMILY HISTORY:  A sister with pancreatic cancer.  Brother with liver  disease.  No colon cancer or colon polyps.  Mother with heart disease.   SOCIAL HISTORY:  Patient is divorced without children.  She lives alone.  She attended high school.  Worked as a Games developer.  Now retired.  She does not smoke, rarely uses alcohol.   REVIEW OF SYSTEMS:  Per diagnostic evaluation form.   PHYSICAL EXAMINATION:  A chronically ill-appearing elderly female in no  acute distress.  She is obese.  Blood pressure is 130/70.  Heart rate 72.  Weight is 261 pounds.  She is  5 feet 8-1/4 inches in height.  HEENT:  Sclerae are anicteric.  Conjunctivae  are pink.  Oral mucosa is  intact.  There is no adenopathy.  LUNGS:  Clear.  HEART:  Regular.  ABDOMEN:  Obese and soft without tenderness, mass, or hernia.  RECTAL:  Hemorrhoids.  Stool is soft, brown, hemoccult negative.  No  mass noted.  EXTREMITIES:  Without edema.  NEUROLOGIC:  She is grossly intact.   IMPRESSION:  1. Minor episode of rectal bleeding, likely due to benign anorectal      pathology, rule out neoplasia.  Currently with no further bleeding      and hemoccult negative stool.  2. Intermittent dysphagia, mostly to solids.  Likely peptic stricture.  3. History of gastroesophageal reflux disease.  Symptoms controlled on      Nexium.  4. Multiple medical problems, including systemic anticoagulation in      the form of Coumadin and insulin-requiring diabetes.   RECOMMENDATIONS:  1. Colonoscopy and upper endoscopy.  The nature of both procedures as      well as the risks, benefits and alternatives were reviewed in great      detail.  She understood and agreed to proceed.  2. Would like to hold Coumadin 4-5 days prior to her examinations if      okay with Dr. Marveen Reeks whom we will consult.  3. Hold insulin the evening before her exam and oral diabetic agents      the day of the exam to avoid unwanted hypoglycemia.  4. Perform the examinations at the hospital due to her multiple      comorbidities.  5. Ongoing general medical care with Dr. Jacky Kindle.     Wilhemina Bonito. Marina Goodell, MD  Electronically Signed    JNP/MedQ  DD: 06/27/2007  DT: 06/27/2007  Job #: [id number]   cc:   Geoffry Paradise, M.D.  Vesta Mixer, M.D.

## 2010-08-10 NOTE — Op Note (Signed)
Joyce Terry, Joyce Terry               ACCOUNT NO.:  [id number]   MEDICAL RECORD NO.:  [id number]          PATIENT TYPE:  INP   LOCATION:  0002                         FACILITY:  [facility]   PHYSICIAN:  Madlyn Frankel. Charlann Boxer, M.D.  DATE OF BIRTH:  September 29, 1935   DATE OF PROCEDURE:  04/22/2008  DATE OF DISCHARGE:                               OPERATIVE REPORT   PREOPERATIVE DIAGNOSIS:  Left hip osteoarthritis.   POSTOPERATIVE DIAGNOSES:  Left hip osteoarthritis associated with  multiple medical comorbidities including diabetes, heart failure with  permanent pacemaking, hypertension, spinal stenosis, hiatal hernia,  atrial fibrillation and reflux disease.   OPERATIVE PROCEDURE:  Left total hip replacement utilizing DePuy  components, 58 pinnacle cup, two cancellous bone screws, 36 neutral  Marathon liner, size 7 standard trial, extended 36 + 5 ball.   SURGEON:  Madlyn Frankel. Charlann Boxer, M.D.   ASSISTANT:  Yetta Glassman. Mann, PA   ANESTHESIA:  Spinal.   ESTIMATED BLOOD LOSS:  250-300 mL.   DRAINS:  One Hemovac.   COMPLICATIONS:  None.   SPECIMENS:  None.   INDICATIONS FOR PROCEDURE:  This is a 75 year old female referred for  evaluation of the left hip.  She had co-existent spinal stenosis and  concerns __________ pain.  She had failed conservative measures.  We had  a lengthy discussion about how to management her concurrent back and hip  issues.  After failing conservative measures I felt that it was best to  go ahead and address the hip as this would be the easiest of two  procedures to recover from with the most predictable pain relief.  After  reviewing the risks of infection, DVT, component failure as well as the  perioperative and medical comorbidities, consent was obtained for the  benefit of pain relief.   DESCRIPTION OF PROCEDURE:  The patient was brought to the operative  theater.  Once adequate anesthesia, preoperative antibiotics with Ancef  administered, the patient was placed in the  right lateral decubitus  position with the left side up.  The left lower extremity was pre-  scrubbed and prepped and draped in a standard fashion.  Time out was  performed to identify the patient and extremity.   A lateral based incision was made for a posterior approach to the hip.  The iliotibial band and gluteal fascia were incised posteriorly.  The  short external rotators were taken down __________ from the posterior  capsule.  An L-capsulotomy was made, preserving the posterior capsule  for later anatomic repair but also to protect the sciatic nerve from  retractors.  The hip was dislocated.  Severe arthritic change was noted.  A neck osteotomy was made based on anatomic landmarks and preoperative  templating.   Attention was first directed to the femur.  The femur was opened with a  drill, hand reamed once and then irrigated to prevent  fat emboli.  I  then began __________ all the way up to a size 6 which initially  __________.  I went ahead and packed off the femur and will attend to  this later to limit trial motion.  Attention was now directed to the acetabulum.  Acetabular exposure was  obtained.  The patient noted to have significant acetabular  calcification and osteophytes.  I debrided some of this as well as the  entire labrum.  I began reaming with a 50 reamer to remove sclerotic  bone and then reamed up to a 57 and had a good bony bed preparation.  There was excellent preservation of the posterior columns, indicating a  very large but stable acetabulum.  The 58 pinnacle cup was then  impacted.  This was impacted with a good initial scratch fit, but I did  place two screws to help support this.  A trial neutral liner was  placed.  The trial reduction was then carried out.  When I put the 6  broach in again and evaluated the relationship with the tip of the  trochanter, I found that it was a little bit short so I went ahead and  used a 7 broach which sat a little  higher than the neck cut.  Based on  the radiographic appearance I went ahead and trialed with a standard  neck.  I trialed first with a 1.57mm ball but then went up to a 5mm ball  which helped to restore my length, compared to the non-operative  extremity. and to add a little bit more off set.  Combined anteversion  was approximately 45-40 degrees without impingement with external  rotation, extension, full abduction and no evidence of impingement with  the sleep position.  At 70 degrees of internal rotation there was a  little bit of subluxation but it was felt to be acceptable.   At this point the trials were all removed.  A hole eliminator was placed  in the acetabular liner and the final 36 neutral marathon liner was  impacted with visualizing seating of the rim.  A final 7 standard trial  stem was impacted to the level of the broach.  I retrialed just to  confirm that the 36 + 5 ball and was happy with the length and  stability.  The 36 + 5 ball was then impacted into a clean and dry  trunnion and the hip reduced.  We irrigated the hip throughout the case  and it was clean.  There was no significant hemostasis required.  I did  use a Hemovac drain, however.  This was placed in deep subcapsular  tissues.  I did reapproximate the posterior leaflet to the superior  leaflet using #1 Vicryl.  The iliotibial band and gluteus fascia then  reapproximated using #1 Vicryl.  The remainder of the wound was closed  with 2-0 Vicryl and a running 4-0 Monocryl.  The hip was cleaned, dried  and dressed sterilely with Steri-Strips and Mepilex dressing.  She was  brought to the recovery room in stable condition, tolerating the  procedure well.      Madlyn Frankel Charlann Boxer, M.D.  Electronically Signed     MDO/MEDQ  D:  04/22/2008  T:  04/22/2008  Job:  [id number]

## 2010-08-13 NOTE — Discharge Summary (Signed)
[place]  Patient:    Joyce Terry, Joyce Terry                      MRN: [id number] Adm. Date:  [id number] Disc. Date: 07/24/99 Attending:  Lewayne Bunting Dictator:   Delton See, P.A. CC:         Alvia Grove., M.D.             Richard A. Jacky Kindle, M.D.             Rudene Christians. Ladona Ridgel, M.D. [facility]                           Discharge Summary  DATE OF BIRTH:  [birth year]  HISTORY ON ADMISSION:  Ms. Ellerby is a very pleasant 75 year old female with a four-year history of atrial fibrillation.  She is followed by Dr. Elease Hashimoto.  She was referred to Dr. Ladona Ridgel for further evaluation.  The patient has had multiple cardioversions and attempts to maintain sinus rhythm with amiodarone. However, unfortunately, she has continued in atrial fibrillation and was referred for further evaluation.  The patient has required multiple medications including Lanoxin, Cardizem, and Toprol to control her ventricular response, and despite the triple drug therapy for ventricular rate control, she had an average heart rate between 85 and 90 beats per minute on a recent Holter monitor.  Arrangements were made to admit the patient to [facility] for ablation and permanent pacemaker.  PAST MEDICAL HISTORY:  Significant for diabetes mellitus and endometrial cancer.  ALLERGIES:  PENICILLIN and CODEINE.  SOCIAL HISTORY:  The patient is divorced. She hs no children.  She does not use tobacco.  She uses alcohol rarely.  FAMILY HISTORY:  The patients father died from unknown causes.  The patients mother died from heart disease, and she has one sister who is alive with diabetes.  HOSPITAL COURSE:  As noted, this patient was admitted to [facility] July 23, 1999, by Dr. Ladona Ridgel for further treatment of atrial fibrillation. On the day of admission, she underwent A-V nodal ablation as well as permanent pacemaker placement using a Medtronic device.  She tolerated the  procedure well. She was seen the following day on rounds, and she had no complaints and was feeling well.  Arrangements have been made to discharge the patient if her chest x-ray is okay.  The chest x-ray is currently pending.  She also had her pacemaker interrogated by the Medtronic rep prior to discharge.  LABORATORY DATA:  There are no labs currently on this patient except for an INR performed April 24 which was 1.5, PTT performed April 24 which was 34.8. Chemistry profile was within normal limits except for a glucose of 219.  A CBC revealed hemoglobin 14.1, hematocrit 41.1, WBC 7600, platelets 295,000.  These labs were performed prior to admission.  As noted, a chest x-ray is currently pending.  DISCHARGE MEDICATIONS: 1. Coumadin 5 mg daily. 2. Claritin 20 mg as needed. 3. Lotensin 10 mg daily. 4. GlucoVance 5/500 to be taken as previously used. 5. Albuterol inhaler as previously used.  Lanoxin and Cardizem medications were discontinued this admission.  ACTIVITY:  As tolerated, although she was to avoid any overhead activity or lifting of her left arm for at least two weeks.  WOUND CARE:  She was told to keep her wound clean and dry for seven days.  FOLLOWUP:  With Dr. Ladona Ridgel  or one of the physician assistants for wound check on May 8 at noon.  She would see Dr. Elease Hashimoto in three to four weeks, Dr. Jacky Kindle as needed.  She was instructed to have a blood test the following week for further adjustment of Coumadin as indicated.  PROBLEM LIST AT TIME OF DISCHARGE: 1. Long history of atrial fibrillation with difficult rate control. 2. Status post arteriovenous nodal ablation. 3. Status post permanent pacemaker. 4. History of diabetes. 5. History of endometrial cancer. 6. Coumadin therapy for atrial fibrillation. 7. PENICILLIN and CODEINE allergies. DD:  07/24/99 TD:  07/24/99 Job: [id number]

## 2010-08-13 NOTE — H&P (Signed)
NAME:  Joyce Terry, Joyce Terry NO.:  [id number]   MEDICAL RECORD NO.:  [id number]          PATIENT TYPE:  INP   LOCATION:                               FACILITY:  [facility]   PHYSICIAN:  Madlyn Frankel. Charlann Boxer, M.D.  DATE OF BIRTH:  11-01-1935   DATE OF ADMISSION:  04/22/2008  DATE OF DISCHARGE:                              HISTORY & PHYSICAL   Date of admission will be April 22, 2008 and procedure will be a left  total hip replacement.   CHIEF COMPLAINT:  Left hip pain.   HISTORY OF PRESENT ILLNESS:  A 75 year old female with a history of left  hip pain secondary to osteoarthritis.  It has been refractory to all  conservative treatment.  She has a significant history of chronic back  pain with spinal stenosis treated by Dr. Ethelene Hal in our clinic.  She has  been presurgically assessed prior to surgery by her primary care  physician, Dr. Geoffry Paradise.   PRIMARY CARE PHYSICIAN:  Dr. Geoffry Paradise.   CARDIOLOGIST:  Dr. Elease Hashimoto.   Dr. Ethelene Hal for chronic back.   CARDIOTHORACIC SURGEON:  Sees Dr. Ladona Ridgel for pacemaker.   PAST MEDICAL HISTORY:  Significant for:  1. Osteoarthritis.  2. Diabetes.  3. Heart failure.  4. Hypertension.  5. Spinal stenosis.  6. Hiatal hernia.  7. Atrial fibrillation with pacemaker.  8. Reflux disease.  9. She had endometrial cancer, had surgery back in 1995.   PAST SURGICAL HISTORY:  1. She had a radical hysterectomy 1995.  2. Cholecystectomy.  3. Heart ablation and pacemaker 2001.  4. Colon polyp removal.   FAMILY HISTORY:  Heart disease, cancer.   SOCIAL HISTORY:  Retired, nonsmoker.  Will require rehab stay, prefers  [facility].   DRUG ALLERGIES:  PENICILLIN and CODEINE.   MEDICATIONS:  1. Alprazolam 0.25 mg p.r.n.  2. Carvedilol 3.125 mg b.i.d.  3. Glyburide 500 two tablets twice daily.  4. Vytorin 10/20 daily.  5. Oxycodone 5/325 with Tylenol 1 p.o. q. 4-6 p.r.n. pain.  6. Fentanyl 25 mcg patch change every 3 days.  7.  Aspirin 81 mg daily.  8. Coumadin 5 mg daily except Wednesday, she takes half on that day.  9. Benazepril HCL 20 mg daily.  10.Furosemide 20 mg daily.  11.Fexofenadine 180 mg daily.  12.Nexium 40 mg daily.  13.Lexapro 20 mg daily.  14.Calcium 600 mg b.i.d.  15.Lantus 40 units injections q.h.s., verify with the patient.  16.NovoLog p.r.n. before meals.   REVIEW OF SYSTEMS:  General: She has fatigue, night sweats.  HEENT: She  wears dentures.  Respiratory:  She has shortness of breath on exertion.  Gastrointestinal: She has heartburn.  Musculoskeletal: She has chronic  back pain that extends in the hips, thighs and legs.  Walking is  difficult, aided by a cane, grocery carts and leaning forward.  Otherwise, see HPI.   PHYSICAL EXAMINATION:  Pulse 72, respirations 16, blood pressure 108/58.  GENERAL:  Awake, alert and oriented, well-developed, well-nourished.  HEENT: Normocephalic.  Neck: Supple.  No carotid bruits.  CHEST/LUNGS:  Clear to auscultation bilaterally.  BREASTS:  Deferred.  HEART:  Regular rate and rhythm.  S1-S2 distinct.  ABDOMEN:  Soft, bowel sounds present.  PELVIS:  Stable.  GENITOURINARY:  Deferred.  EXTREMITIES:  Left hip has decreased range of motion and increased pain.  SKIN:  No cellulitis.  NEUROLOGIC:  Intact to distal sensibilities.   LABORATORY DATA:  Labs, EKG, chest x-ray all pending presurgical  testing.   IMPRESSION:  Left hip osteoarthritis.   PLAN OF ACTION:  Left total hip replacement at [facility]  April 22, 2008 by Dr. Durene Romans.  Risks and complications were  discussed.   The patient is planning on a rehab stay postoperatively, would prefer  [facility].     ______________________________  Yetta Glassman. [address], [place]      Madlyn Frankel. Charlann Boxer, M.D.  Electronically Signed    BLM/MEDQ  D:  04/02/2008  T:  04/02/2008  Job:  [id number]   cc:   Geoffry Paradise, M.D.  Fax: [phone]   Olegario Messier, MD Tristan Schroeder, M.D.   Fax: [phone]   M. Leda Quail, MD  Fax: [phone]   Caralyn Guile. Ethelene Hal, M.D.  Fax: [phone]

## 2010-08-13 NOTE — Op Note (Signed)
[place]  Patient:    MARLY, SCHULD                      MRN: [id number] Proc. Date: 07/23/99 Adm. Date:  [id number] Attending:  Lewayne Bunting CC:         Alvia Grove., M.D.             Kathrine Cords, R.N., [facility]                           Operative Report  PROCEDURE PERFORMED:  Permanent pacemaker implantation followed by arteriovenous node ablation.  INDICATION:  Atrial fibrillation with rapid ventricular rates.  I:  INTRODUCTION:  The patient is a 75 year old woman with a long history of chronic atrial fibrillation with rapid ventricular rates who has been unable to maintain sinus rhythm.  She is followed by Dr. Delane Ginger.  On Holter monitor despite triple drug therapy with a combination of Cardizem, Lanoxin and Toprol, she had documented ventricular rates of over 100 beats per minute for an average of the time with an average heart rate of 104 beats per minute. She is subsequently referred for permanent pacemaker implantation and AV node ablation to control her ventricular rates.  II:  DESCRIPTION OF PROCEDURE:  After informed consent was obtained, the patient was taken to the diagnostic EP Lab in the fasting state.  After usual preparation and draping, a total of 25 cc of lidocaine was injection into the left infraclavicular region.  At this location, a 6 cm incision was carried over the left infraclavicular region and electrocautery was utilized to dissect down to the subpectoralis fascia.  A subcutaneous pocket was fashioned with electrocautery.  Next, the left upper extremity was injected with 20 cc of contrast demonstrating a patent left subclavian vein.  Following this, the left subclavian vein was punctured and a J wire advanced under fluoroscopic guidance to the right atrium.  A 7 French peel-away sheath was then placed in the left subclavian vein over the guidewire and the Medtronic model 5092, 58 cm passive  fixation bipolar pacing lead was advanced into the right ventricle. It was maneuvered into the right ventricular apex where there were very satisfactory R waves and patient thresholds.  At this location, it was secured to the subpectoralis fascia with a figure-of-eight suture and the sewing sleeve was also secured to the subpectoralis fascia with silk suture.  At this point the Medtronic Sigma SSR [id number].  A single chamber pacemaker was connected to the ventricular pacing lead and placed in the subcutaneous pocket.  It was secured to the pocket with silk suture.  The kanamycin irrigation was then utilized to irrigate the wound.  Following this, a layer of 2-0 Vicryl followed by a layer of 3-0 Vicryl, followed by a layer of 4-0 Vicryl was used to close the wound.  Benzoin was painted on the skin and Steri-Strips were applied and the patient was prepped for AV node ablation.  After the usual preparation and draping, a 8 French sheath was placed in the right femoral vein and a 7 [place] quadripolar ablation catheter was maneuvered into the femoral vein and up into the right atrium.  ______ in the His bundle region was then carried out and a total of 11 RF energy applications were subsequently delivered to the atrial insertion of the AV node.  This resulted in slowing of the patients ventricular  conduction from 110 beats per minute down to 55-60 beats per minute.  Unfortunately, some residual ventricular conduction persisted.  At this point, the right femoral artery was punctured and a second 8 French sheath was placed in the femoral artery.  The 7 French quadripolar ablation catheter was removed from the femoral vein and placed in the right femoral artery and advanced retrograde across the aortic valve into the left ventricle.  A single RF energy application was delivered to the left ventricular insertion of the AV node, which resulted in creation of complete heart block.  Following this, the  patient was observed for 20 minutes and had no additional AV conduction.  She did have a nice junctional escape at 40 beats per minute.  At this point, the sheath was removed, hemostasis was assured, and the patient was returned to her room in good condition.  III:  COMPLICATIONS:  None.  IV:  RESULTS:  This demonstrates successful implantation of a Medtronic single chamber pacemaker with measured R waves of 12 mV and a ventricular pacing threshold of 0.5 volts at 0.5 msec.  It also demonstrates decrease in complete heart block with a total of 12 RF energy applications delivered to the region of the AV node.  There were no immediate procedure complications. DD:  07/23/99 TD:  07/24/99 Job: [id number]

## 2010-08-13 NOTE — Op Note (Signed)
Joyce Terry. [facility]  Patient:    Joyce Terry, Joyce Terry                      MRN: [id number] Proc. Date: 11/04/99 Adm. Date:  [id number] Attending:  Charlton Haws CC:         Richard A. Jacky Kindle, M.D.  Alvia Grove., M.D.   Operative Report  CCS# [id number]  PREOPERATIVE DIAGNOSIS:  Chronic calculus cholecystitis.  POSTOPERATIVE DIAGNOSIS:  Chronic calculus cholecystitis.  OPERATION PERFORMED:  Laparoscopic cholecystectomy.  SURGEON:  Currie Paris, M.D.  ASSISTANT:  Dr. Orpah Greek.  ANESTHESIA:  General endotracheal.  INDICATIONS FOR PROCEDURE:  The patient is a 75 year old woman with a history of biliary type symptoms and gallstones seen on ultrasound.  Liver functions were normal.  DESCRIPTION OF PROCEDURE:   The patient was brought to the operating room and after satisfactory general anesthesia had been obtained, the abdomen was prepped and draped.  I used 0.25% plain Marcaine at each incision site.  The umbilical incision was made and the fascia identified and opened.  The peritoneal cavity was entered under direct vision.  A pursestring was placed and the Hasson was introduced.  The abdomen was insufflated to 15.  The patient was placed in reversed Trendelenburg position and tilted to the left.  Three additional cannulas were placed in the usual positions.  There were no adhesions around the gallbladder and it retracted easily over the liver.  The peritoneum over the cystic duct was opened and I could identify the cystic duct was opened.  I could identify the cystic duct and see its junction with the common duct and with the gallbladder.  What I initially thought was the cystic artery was a little bit prominent and so I traced that up a little bit and it looked like there was a loop of the right hepatic coming up.  Once I had the area cleared off, I opened the peritoneum on the gallbladder so I could get a little better  visualization into the triangle of Calot.  I could see clearly that the cystic duct was identified well in that the artery appeared to have a rather high branching.  At this point I divided the cystic duct by putting two clips on the stay side and one on the stay side and one on the gallbladder side dividing it.  This gave me additional visualization and I could see the branching of the hepatic artery into the anterior posterior branches of the cystic artery plus another small vessel also going posteriorly.  These were dissected out individually and triple clipped and divided leaving one clip on the stay side on each. This freed up the entire triangle of Calot and we were then able to remove the gallbladder from below to above using coagulation current of the cautery. Once it was almost completely disconnected, we stopped, irrigated and made sure the bed of the gallbladder was dry, we reinspected the area around the cystic duct to make sure there was no bleeding there or bile leak and everything again appeared fine.  The gallbladder was disconnected from the liver and brought out the umbilical port. The portsite was occluded and the abdomen reinsufflated and we then irrigated and suctioned any residual fluid out. The lateral ports were removed and it appeared to be dry.  The umbilical port was closed with a pursestring and appeared okay.  The abdomen was deflated through the epigastric port and all skin  incisions were closed with 4-0 Monocryl subcuticular plus Steri-Strips.  The patient tolerated the procedure well.  There was no operative complications.  All counts were correct. DD:  11/04/99 TD:  11/04/99 Job: [id number]

## 2010-09-13 ENCOUNTER — Ambulatory Visit (INDEPENDENT_AMBULATORY_CARE_PROVIDER_SITE_OTHER): Payer: Medicare Other | Admitting: *Deleted

## 2010-09-13 DIAGNOSIS — I4892 Unspecified atrial flutter: Secondary | ICD-10-CM

## 2010-09-13 DIAGNOSIS — I4891 Unspecified atrial fibrillation: Secondary | ICD-10-CM

## 2010-09-13 DIAGNOSIS — Z950 Presence of cardiac pacemaker: Secondary | ICD-10-CM

## 2010-09-13 NOTE — Progress Notes (Signed)
Pacer check in clinic  

## 2010-09-16 ENCOUNTER — Encounter: Payer: Self-pay | Admitting: *Deleted

## 2010-09-16 ENCOUNTER — Telehealth: Payer: Self-pay | Admitting: Cardiovascular Disease

## 2010-09-16 NOTE — Telephone Encounter (Signed)
Pt called wanted to talk you about several things wouldn't tell my why

## 2010-09-20 ENCOUNTER — Encounter: Payer: Self-pay | Admitting: Cardiovascular Disease

## 2010-09-20 ENCOUNTER — Ambulatory Visit: Payer: Medicare Other | Admitting: Cardiovascular Disease

## 2010-09-20 ENCOUNTER — Ambulatory Visit (INDEPENDENT_AMBULATORY_CARE_PROVIDER_SITE_OTHER): Payer: Medicare Other | Admitting: Cardiovascular Disease

## 2010-09-20 VITALS — BP 106/58 | HR 72 | Ht 68.5 in | Wt 235.8 lb

## 2010-09-20 DIAGNOSIS — I509 Heart failure, unspecified: Secondary | ICD-10-CM

## 2010-09-20 DIAGNOSIS — I4891 Unspecified atrial fibrillation: Secondary | ICD-10-CM

## 2010-09-20 DIAGNOSIS — I5032 Chronic diastolic (congestive) heart failure: Secondary | ICD-10-CM | POA: Insufficient documentation

## 2010-09-20 NOTE — Progress Notes (Signed)
Joyce Terry Date of Birth  1935/09/12 [facility] [address]     [address] [place], [place]  [address] [phone]  Fax  [phone]  History of Present Illness:  No cardiac complaints.  Having lots of back and right leg pain.  No dyspnea.  No syncope   Current Outpatient Prescriptions on File Prior to Visit  Medication Sig Dispense Refill  . ALPRAZolam (XANAX) 0.25 MG tablet Take 0.25 mg by mouth at bedtime as needed.        Marland Kitchen aspirin 81 MG tablet Take 81 mg by mouth daily.        . benazepril (LOTENSIN) 10 MG tablet Take 10 mg by mouth daily.        . carvedilol (COREG) 3.125 MG tablet take 1 tablet twice a day  60 tablet  5  . escitalopram (LEXAPRO) 10 MG tablet Take 10 mg by mouth daily.        Marland Kitchen esomeprazole (NEXIUM) 40 MG capsule Take 40 mg by mouth daily before breakfast.        . ezetimibe-simvastatin (VYTORIN) 10-20 MG per tablet Take 1 tablet by mouth at bedtime.        . fentaNYL (DURAGESIC) 100 MCG/HR Place 1 patch onto the skin every 3 (three) days.        . Fluticasone-Salmeterol (ADVAIR DISKUS IN) Inhale into the lungs as directed.        . glyBURIDE-metformin (GLUCOVANCE) 5-500 MG per tablet Take 2 tablets by mouth daily with breakfast.        . insulin aspart (NOVOLOG) 100 UNIT/ML injection Inject into the skin as directed.        . insulin glargine (LANTUS) 100 UNIT/ML injection Inject into the skin as directed.        . iron polysaccharides (NIFEREX) 150 MG capsule Take 150 mg by mouth daily.        Marland Kitchen oxyCODONE-acetaminophen (PERCOCET) 10-325 MG per tablet Take 1 tablet by mouth every 4 (four) hours as needed.        . torsemide (DEMADEX) 20 MG tablet Take 1 tablet by mouth Daily.      Marland Kitchen warfarin (COUMADIN) 5 MG tablet Take 5 mg by mouth daily.        Marland Kitchen zolpidem (AMBIEN) 10 MG tablet Take 10 mg by mouth at bedtime as needed.          Allergies  Allergen Reactions  . Codeine   . Demerol   . Penicillins   . Zetia  (Ezetimibe)     Past Medical History  Diagnosis Date  . CHF (congestive heart failure)     chronic systolic CHF  . Hypertension   . Atrial fibrillation   . Obesity   . Coronary artery disease     cardiac catheterization in 1997 -- Est. EF of 50%  . Fatigue   . Stress   . Chest pain   . Shortness of breath   . Peptic ulcer 1985    Past Surgical History  Procedure Date  . Cardiac catheterization 02/21/96    Est. EF of 50%  . Insert / replace / remove pacemaker 07/23/99    atrial insertion of the AV node -- Medtronic single chamber pacemaker   . Vesicovaginal fistula closure w/ tah 1995  . Tonsillectomy     History  Smoking status  . Former Smoker  . Quit date: 03/28/1989  Smokeless tobacco  . Not on file  History  Alcohol Use No    Family History  Problem Relation Age of Onset  . Heart disease Mother   . Hypertension Mother     Reviw of Systems:  Reviewed in the HPI.  All other systems are negative.  Physical Exam: BP 106/58  Pulse 72  Ht 5' 8.5" (1.74 m)  Wt 235 lb 12.8 oz (106.958 kg)  BMI 35.33 kg/m2 The patient is alert and oriented x 3.  The mood and affect are normal.   Skin: warm and dry.  Color is normal.    HEENT:   the sclera are nonicteric.  The mucous membranes are moist.  The carotids are 2+ without bruits.  There is no thyromegaly.  There is no JVD.    Lungs: clear.  The chest wall is non tender.    Heart: regular rate with a normal S1 and S2.  There are no murmurs, gallops, or rubs. The PMI is not displaced.     Abdomin: good bowel sounds.  There is no guarding or rebound.  There is no hepatosplenomegaly or tenderness.  There are no masses.   Extremities:  no clubbing, cyanosis, or edema.  The legs are without rashes.  The distal pulses are intact.   Neuro:  Cranial nerves II - XII are intact.  Motor and sensory functions are intact.    The gait is normal.  ECG: V paced at 72 bpm.    Assessment / Plan:

## 2010-09-20 NOTE — Assessment & Plan Note (Signed)
Pt has done well.  Has not been limited by her dyspnea.  More limited by back pain.  EF was 40-45% in 2007 Echo.  If she needs to have back surgery, I would like to get an updated echo to assess LV function.

## 2010-09-20 NOTE — Assessment & Plan Note (Signed)
She is currently ventricular paced.  She is status post AV node ablation.  She's done very well. She remains on chronic Coumadin therapy.

## 2010-09-21 ENCOUNTER — Encounter: Payer: Self-pay | Admitting: Cardiovascular Disease

## 2010-10-04 ENCOUNTER — Other Ambulatory Visit: Payer: Self-pay | Admitting: Cardiovascular Disease

## 2010-10-04 NOTE — Telephone Encounter (Signed)
Fax received from pharmacy. Refill completed. Jodette Wilna Pennie RN  

## 2010-12-15 ENCOUNTER — Encounter: Payer: Self-pay | Admitting: Internal Medicine

## 2010-12-15 ENCOUNTER — Ambulatory Visit (INDEPENDENT_AMBULATORY_CARE_PROVIDER_SITE_OTHER): Payer: Medicare Other | Admitting: *Deleted

## 2010-12-15 DIAGNOSIS — I495 Sick sinus syndrome: Secondary | ICD-10-CM

## 2010-12-15 LAB — PACEMAKER DEVICE OBSERVATION: BMOD-0003RV: 30

## 2010-12-15 NOTE — Progress Notes (Signed)
PPM interrogation only  

## 2011-01-25 ENCOUNTER — Telehealth: Payer: Self-pay | Admitting: Internal Medicine

## 2011-01-25 NOTE — Telephone Encounter (Signed)
Spoke with pt who states she is having SOB and chest discomfort but these s/s have been going on for a while.  She feels her SOB may be worse than before.  She denies any weight gain or edema.  No chest discomfort at this time. She has been given an appointment with Dr Elease Hashimoto for 10/31 at 3 pm.  She was given directions to the office and instructed to call back if s/s worsen prior to the appointment time.

## 2011-01-25 NOTE — Telephone Encounter (Signed)
Per pt for the last week pt is very SOB, chest pain,dizziness and her appt is not until Nov 29th and she doesn't think she can make it that long because she is getting worse

## 2011-01-26 ENCOUNTER — Ambulatory Visit (INDEPENDENT_AMBULATORY_CARE_PROVIDER_SITE_OTHER): Payer: Medicare Other | Admitting: Cardiovascular Disease

## 2011-01-26 ENCOUNTER — Encounter: Payer: Self-pay | Admitting: Cardiovascular Disease

## 2011-01-26 VITALS — BP 126/68 | HR 65 | Ht 65.0 in | Wt 235.4 lb

## 2011-01-26 DIAGNOSIS — I4891 Unspecified atrial fibrillation: Secondary | ICD-10-CM

## 2011-01-26 DIAGNOSIS — R0600 Dyspnea, unspecified: Secondary | ICD-10-CM | POA: Insufficient documentation

## 2011-01-26 DIAGNOSIS — R0609 Other forms of dyspnea: Secondary | ICD-10-CM

## 2011-01-26 DIAGNOSIS — R0989 Other specified symptoms and signs involving the circulatory and respiratory systems: Secondary | ICD-10-CM

## 2011-01-26 NOTE — Assessment & Plan Note (Signed)
She continues to have atrial fibrillation. She has ventricular pacing on exam.

## 2011-01-26 NOTE — Progress Notes (Signed)
Joyce Terry Date of Birth  07/14/1935 [facility] [address]    [address] [place], [place]  [address] [phone]  Fax  [phone]  History of Present Illness:  75 year old female with a history of coronary artery disease. She also has a pacemaker. She has been having severe dyspnea on exertion for the past several months. She states that walking as little as 50 feet can cause her to be severely dyspneic. She feels fine at rest.  She also complains of diffuse muscle aches.    Current Outpatient Prescriptions on File Prior to Visit  Medication Sig Dispense Refill  . ALPRAZolam (XANAX) 0.25 MG tablet Take 0.25 mg by mouth at bedtime as needed.        Marland Kitchen aspirin 81 MG tablet Take 81 mg by mouth daily.        . benazepril (LOTENSIN) 10 MG tablet Take 10 mg by mouth daily.       . carvedilol (COREG) 3.125 MG tablet take 1 tablet twice a day  60 tablet  5  . escitalopram (LEXAPRO) 10 MG tablet Take 10 mg by mouth daily.        Marland Kitchen esomeprazole (NEXIUM) 40 MG capsule Take 40 mg by mouth daily before breakfast.        . ezetimibe-simvastatin (VYTORIN) 10-20 MG per tablet Take 1 tablet by mouth at bedtime.        . fentaNYL (DURAGESIC) 100 MCG/HR Place 1 patch onto the skin every 3 (three) days.       . Fluticasone-Salmeterol (ADVAIR DISKUS IN) Inhale into the lungs as directed.        . glyBURIDE-metformin (GLUCOVANCE) 5-500 MG per tablet Take 2 tablets by mouth 2 (two) times daily with a meal.       . insulin aspart (NOVOLOG) 100 UNIT/ML injection Inject into the skin as directed.        . insulin glargine (LANTUS) 100 UNIT/ML injection Inject 30 Units into the skin as directed.       . iron polysaccharides (NIFEREX) 150 MG capsule Take 150 mg by mouth daily.        Marland Kitchen oxyCODONE-acetaminophen (PERCOCET) 10-325 MG per tablet Take 1 tablet by mouth every 4 (four) hours as needed.        . torsemide (DEMADEX) 20 MG tablet take 1 tablet by mouth once daily  30 tablet  5  . warfarin  (COUMADIN) 5 MG tablet Take 5 mg by mouth daily.        Marland Kitchen zolpidem (AMBIEN) 10 MG tablet Take 10 mg by mouth at bedtime as needed.          Allergies  Allergen Reactions  . Codeine   . Demerol   . Penicillins     Past Medical History  Diagnosis Date  . CHF (congestive heart failure)     chronic systolic CHF  . Hypertension   . Atrial fibrillation   . Obesity   . Coronary artery disease     cardiac catheterization in 1997 -- Est. EF of 50%  . Fatigue   . Stress   . Chest pain   . Shortness of breath   . Peptic ulcer 1985    Past Surgical History  Procedure Date  . Cardiac catheterization 02/21/96    Est. EF of 50%  . Insert / replace / remove pacemaker 07/23/99    atrial insertion of the AV node -- Medtronic single chamber pacemaker   . Vesicovaginal fistula closure  w/ tah 1995  . Tonsillectomy     History  Smoking status  . Former Smoker  . Quit date: 03/28/1989  Smokeless tobacco  . Not on file    History  Alcohol Use No    Family History  Problem Relation Age of Onset  . Heart disease Mother   . Hypertension Mother     Reviw of Systems:  Reviewed in the HPI.  All other systems are negative.  Physical Exam: BP 126/68  Pulse 65  Wt 235 lb 6.4 oz (106.777 kg)  SpO2 98% The patient is alert and oriented x 3.  The mood and affect are normal.   Skin: warm and dry.  Color is pale.    HEENT:   Her neck is supple. There is no JVD. Carotids are normal. Her mucous membranes are moist.  Lungs: Clear to patient   Heart: Regular rate S1-S2. She is a soft systolic murmur    Abdomen: Mild obesity. She has good bowel sounds.  Extremities:  No clubbing cyanosis or edema  Neuro:  Nonfocal. Her gait is somewhat slow but is otherwise normal.    ECG: Ventricular pacing  Assessment / Plan:

## 2011-01-26 NOTE — Assessment & Plan Note (Signed)
She had profound dyspnea walking into the office today. Her O2 saturation was normal by the time she got to the exam table. I do not have a clear cut explanation for this dyspnea. She has a history of coronary artery disease in the past.  I would like to check an echocardiogram for further evaluation of her left ventricular systolic function and valvular function. This will also be very important before we replace her pacemaker.   We'll also check basic lab work including a TSH, basic metabolic profile, the natural peptide, and CBC. Her color looks a little bit pale today.

## 2011-01-26 NOTE — Patient Instructions (Signed)
Your physician recommends that you schedule a follow-up appointment in: 1 month  Your physician recommends that you have labs today,    Your physician has requested that you have an echocardiogram. Echocardiography is a painless test that uses sound waves to create images of your heart. It provides your doctor with information about the size and shape of your heart and how well your heart's chambers and valves are working. This procedure takes approximately one hour. There are no restrictions for this procedure.    Your physician has requested that you have a lexiscan myoview. For further information please visit [email or web address] Please follow instruction sheet, as given.

## 2011-01-27 ENCOUNTER — Telehealth: Payer: Self-pay | Admitting: *Deleted

## 2011-01-27 LAB — BASIC METABOLIC PANEL
BUN: 20 mg/dL (ref 6–23)
Calcium: 9 mg/dL (ref 8.4–10.5)
Creatinine, Ser: 1.3 mg/dL — ABNORMAL HIGH (ref 0.4–1.2)

## 2011-01-27 LAB — CBC WITH DIFFERENTIAL/PLATELET
Basophils Absolute: 0 10*3/uL (ref 0.0–0.1)
Basophils Relative: 0.2 % (ref 0.0–3.0)
Eosinophils Absolute: 0.1 10*3/uL (ref 0.0–0.7)
HCT: 31.1 % — ABNORMAL LOW (ref 36.0–46.0)
Hemoglobin: 9.6 g/dL — ABNORMAL LOW (ref 12.0–15.0)
Lymphs Abs: 1.3 10*3/uL (ref 0.7–4.0)
MCHC: 30.9 g/dL (ref 30.0–36.0)
MCV: 66.1 fl — ABNORMAL LOW (ref 78.0–100.0)
Neutro Abs: 7.7 10*3/uL (ref 1.4–7.7)
RBC: 4.71 Mil/uL (ref 3.87–5.11)
RDW: 17.6 % — ABNORMAL HIGH (ref 11.5–14.6)

## 2011-01-27 LAB — BRAIN NATRIURETIC PEPTIDE: Pro B Natriuretic peptide (BNP): 82 pg/mL (ref 0.0–100.0)

## 2011-01-27 LAB — HEPATIC FUNCTION PANEL
ALT: 18 U/L (ref 0–35)
Albumin: 3.7 g/dL (ref 3.5–5.2)
Total Protein: 7 g/dL (ref 6.0–8.3)

## 2011-01-27 LAB — TSH: TSH: 2.28 u[IU]/mL (ref 0.35–5.50)

## 2011-01-27 NOTE — Telephone Encounter (Signed)
Spoke with pt and gave lab results/ then sent to Dr Jacky Kindle, he watches her HGB and adjusts iron, she states she is better today than two days ago. Still c/o sob with exertion and weakness. Spo2 yesterday was 96-98% while walking and on RA. Pt to have echo and lexiscan, f/u with D Jacky Kindle and go to ER if worsens. Pt agreed verbally to do so.

## 2011-01-28 NOTE — Telephone Encounter (Signed)
Agree 

## 2011-01-31 ENCOUNTER — Other Ambulatory Visit: Payer: Self-pay | Admitting: Cardiovascular Disease

## 2011-01-31 NOTE — Telephone Encounter (Signed)
Called pt, did ok over weekend, no er visits.

## 2011-01-31 NOTE — Telephone Encounter (Signed)
Fax Received. Refill Completed. Joyce Terry (M.A)  

## 2011-02-02 ENCOUNTER — Other Ambulatory Visit (HOSPITAL_COMMUNITY): Payer: Self-pay | Admitting: Internal Medicine

## 2011-02-03 ENCOUNTER — Ambulatory Visit (HOSPITAL_COMMUNITY): Payer: Medicare Other | Attending: Internal Medicine

## 2011-02-03 ENCOUNTER — Ambulatory Visit (HOSPITAL_COMMUNITY): Payer: Medicare Other | Attending: Internal Medicine | Admitting: Radiology

## 2011-02-03 DIAGNOSIS — I080 Rheumatic disorders of both mitral and aortic valves: Secondary | ICD-10-CM | POA: Insufficient documentation

## 2011-02-03 DIAGNOSIS — R011 Cardiac murmur, unspecified: Secondary | ICD-10-CM

## 2011-02-03 DIAGNOSIS — E669 Obesity, unspecified: Secondary | ICD-10-CM | POA: Insufficient documentation

## 2011-02-03 DIAGNOSIS — I379 Nonrheumatic pulmonary valve disorder, unspecified: Secondary | ICD-10-CM | POA: Insufficient documentation

## 2011-02-03 DIAGNOSIS — R0609 Other forms of dyspnea: Secondary | ICD-10-CM

## 2011-02-03 DIAGNOSIS — R0989 Other specified symptoms and signs involving the circulatory and respiratory systems: Secondary | ICD-10-CM

## 2011-02-03 DIAGNOSIS — I10 Essential (primary) hypertension: Secondary | ICD-10-CM | POA: Insufficient documentation

## 2011-02-03 DIAGNOSIS — I079 Rheumatic tricuspid valve disease, unspecified: Secondary | ICD-10-CM | POA: Insufficient documentation

## 2011-02-03 MED ORDER — TECHNETIUM TC 99M TETROFOSMIN IV KIT
33.0000 | PACK | Freq: Once | INTRAVENOUS | Status: AC | PRN
  Administered 2011-02-03: 33 via INTRAVENOUS

## 2011-02-08 ENCOUNTER — Encounter: Payer: Self-pay | Admitting: Cardiovascular Disease

## 2011-02-08 ENCOUNTER — Ambulatory Visit (HOSPITAL_COMMUNITY): Payer: Medicare Other | Attending: Cardiology | Admitting: Radiology

## 2011-02-08 DIAGNOSIS — I10 Essential (primary) hypertension: Secondary | ICD-10-CM | POA: Insufficient documentation

## 2011-02-08 DIAGNOSIS — Z950 Presence of cardiac pacemaker: Secondary | ICD-10-CM

## 2011-02-08 DIAGNOSIS — Z8249 Family history of ischemic heart disease and other diseases of the circulatory system: Secondary | ICD-10-CM | POA: Insufficient documentation

## 2011-02-08 DIAGNOSIS — R000 Tachycardia, unspecified: Secondary | ICD-10-CM | POA: Insufficient documentation

## 2011-02-08 DIAGNOSIS — Z87891 Personal history of nicotine dependence: Secondary | ICD-10-CM | POA: Insufficient documentation

## 2011-02-08 DIAGNOSIS — E669 Obesity, unspecified: Secondary | ICD-10-CM | POA: Insufficient documentation

## 2011-02-08 DIAGNOSIS — R0789 Other chest pain: Secondary | ICD-10-CM | POA: Insufficient documentation

## 2011-02-08 DIAGNOSIS — E785 Hyperlipidemia, unspecified: Secondary | ICD-10-CM | POA: Insufficient documentation

## 2011-02-08 DIAGNOSIS — R5381 Other malaise: Secondary | ICD-10-CM | POA: Insufficient documentation

## 2011-02-08 DIAGNOSIS — I2510 Atherosclerotic heart disease of native coronary artery without angina pectoris: Secondary | ICD-10-CM

## 2011-02-08 DIAGNOSIS — E119 Type 2 diabetes mellitus without complications: Secondary | ICD-10-CM | POA: Insufficient documentation

## 2011-02-08 DIAGNOSIS — R002 Palpitations: Secondary | ICD-10-CM | POA: Insufficient documentation

## 2011-02-08 DIAGNOSIS — R0609 Other forms of dyspnea: Secondary | ICD-10-CM | POA: Insufficient documentation

## 2011-02-08 DIAGNOSIS — I4891 Unspecified atrial fibrillation: Secondary | ICD-10-CM | POA: Insufficient documentation

## 2011-02-08 DIAGNOSIS — Z794 Long term (current) use of insulin: Secondary | ICD-10-CM | POA: Insufficient documentation

## 2011-02-08 DIAGNOSIS — R0989 Other specified symptoms and signs involving the circulatory and respiratory systems: Secondary | ICD-10-CM | POA: Insufficient documentation

## 2011-02-08 DIAGNOSIS — R0600 Dyspnea, unspecified: Secondary | ICD-10-CM

## 2011-02-08 DIAGNOSIS — R61 Generalized hyperhidrosis: Secondary | ICD-10-CM | POA: Insufficient documentation

## 2011-02-08 MED ORDER — TECHNETIUM TC 99M TETROFOSMIN IV KIT
33.0000 | PACK | Freq: Once | INTRAVENOUS | Status: AC | PRN
  Administered 2011-02-08: 33 via INTRAVENOUS

## 2011-02-08 MED ORDER — ADENOSINE (DIAGNOSTIC) 3 MG/ML IV SOLN
0.5600 mg/kg | Freq: Once | INTRAVENOUS | Status: AC
  Administered 2011-02-08: 60.3 mg via INTRAVENOUS

## 2011-02-08 NOTE — Progress Notes (Signed)
[facility] SITE 3 NUCLEAR MED [address] [place] [address] [phone]  Cardiology Nuclear Med Study  Joyce Terry is a 75 y.o. female [id number] 05-14-1935   Nuclear Med Background Indication for Stress Test:  Evaluation for Ischemia and for Possible Generator Change Out by Dr. Lewayne Bunting History:  H/O Atrial Fibrillation, Ablation/Multiple Cardioversion; '97 Cath:Cath:Minimal CAD,  EF=50%; '01 PTVP; '07 Echo:EF=40-45%, Mild MR/TR, Mild LVH; '09 MPS:No ischemia, EF=61% Cardiac Risk Factors: Family History - CAD, History of Smoking, Hypertension, IDDM Type 2, Lipids and Obesity  Symptoms:  Chest Pressure with Exertion (last episode of chest discomfort was about one week ago), Diaphoresis, DOE, Fatigue, Palpitations and Rapid HR   Nuclear Pre-Procedure Caffeine/Decaff Intake:  None NPO After: 8:30am   Lungs:  Clear.  O2 SAT 97% on RA. IV 0.9% NS with Angio Cath:  20g  IV Site: R Antecubital  IV Started by:  Stanton Kidney, EMT-P  Chest Size (in):  48 Cup Size: D  Height: 5\' 5"  (1.651 m)  Weight:  237 lb (107.502 kg)  BMI:  Body mass index is 39.44 kg/(m^2). Tech Comments:  Held Coreg this am, CBG=164 @ 9am, per patient.    Nuclear Med Study 1 or 2 day study: 2 day  Stress Test Type:  Adenosine  Reading MD: Olga Millers, MD  Order Authorizing Provider:  Kristeen Miss, MD  Resting Radionuclide: Technetium 20m Tetrofosmin  Resting Radionuclide Dose: 33 mCi  02/03/11  Stress Radionuclide:  Technetium 9m Tetrofosmin  Stress Radionuclide Dose: 33 mCi  02/08/11          Stress Protocol Rest HR: 65 Stress HR: 112  Rest BP: 122/46 Stress BP: 112/45  Exercise Time (min): n/a METS: n/a   Predicted Max HR: 147 bpm % Max HR: 44.22 bpm Rate Pressure Product: 7280   Dose of Adenosine (mg):  n/a Dose of Lexiscan: 0.4 mg  Dose of Atropine (mg): n/a Dose of Dobutamine: n/a mcg/kg/min (at max HR)  Stress Test Technologist: Smiley Houseman, CMA-N  Nuclear  Technologist:  Domenic Polite, CNMT     Rest Procedure:  Myocardial perfusion imaging was performed at rest 45 minutes following the intravenous administration of Technetium 24m Tetrofosmin.  Rest ECG: V-Paced, LBBB.  Stress Procedure:  The patient received IV adenosine at 140 mcg/kg/min for 4 minutes.  There were no significant changes with infusion.  She did c/o  Chest pressure with infusion.  Technetium 56m Tetrofosmin was injected at the 2 minute mark and quantitative spect images were obtained after a 45 minute delay.  Stress ECG: Uninteretable due to baseline ventricular pacing.  QPS Raw Data Images:  Acquisition technically good; normal left ventricular size. Stress Images:  There is decreased uptake in the distal anterior wall and apex. Rest Images:  Normal homogeneous uptake in all areas of the myocardium. Subtraction (SDS):  These findings are consistent with ischemia. Transient Ischemic Dilatation (Normal <1.22):  1.01 Lung/Heart Ratio (Normal <0.45):  .17  Quantitative Gated Spect Images QGS EDV:  95 ml QGS ESV:  32 ml QGS cine images:  NL LV Function; NL Wall Motion QGS EF: 66%  Impression Exercise Capacity:  Adenosine study with no exercise. BP Response:  Normal blood pressure response. Clinical Symptoms:  Patient complains of slight chest pain. ECG Impression:  EKG uninterpretable due to ventricular pacing at rest and stress. Comparison with Prior Nuclear Study: No images to compare  Overall Impression:  Abnormal stress nuclear study with a small, reversible defect in  the distal anterior wall and apex consistent with mild ischemia.  Olga Millers

## 2011-02-11 ENCOUNTER — Other Ambulatory Visit: Payer: Self-pay | Admitting: Cardiovascular Disease

## 2011-02-11 ENCOUNTER — Telehealth: Payer: Self-pay | Admitting: *Deleted

## 2011-02-11 ENCOUNTER — Encounter: Payer: Self-pay | Admitting: *Deleted

## 2011-02-11 DIAGNOSIS — I2510 Atherosclerotic heart disease of native coronary artery without angina pectoris: Secondary | ICD-10-CM

## 2011-02-11 NOTE — Telephone Encounter (Signed)
Pt called and told to stop coumadin. Eat lots of fresh greens. Labs Monday 02/14/11 @ 8:30 am JV lab on 02/15/11 @ 8:30 arrival for 9:30 app with Dr Swaziland. Pt to talk with me on 11/19 to receive paperwork we reviewed by phone. inr result request called to dr Jacky Kindle. I reviewed paperwork by phone with instructions to decrease insulin eve prior and day of to hold insulin and metformin, to take asa full strength that am. Pt verbalized understanding. Alfonso Ramus RN

## 2011-02-11 NOTE — Telephone Encounter (Signed)
Joyce Terry  Date of Birth 04/22/1935  [facility]  [address]  [place], [place] [address]  [phone] Fax [phone]    History of Present Illness:  75-year-old female with a history of coronary artery disease. She also has a pacemaker. She has been having severe dyspnea on exertion for the past several months. She states that walking as little as 50 feet can cause her to be severely dyspneic. She feels fine at rest. She also complains of diffuse muscle aches.  She's been having more and more dyspnea with exertion. We schedule her for her for a stress Myoview study. She was found to have a reversible apical defect. She has known mild to moderate coronary artery disease from a heart cath 6 years ago. Her ejection fraction was normal at 66%.  Because of her progressive symptoms and a fairly normal ejection fraction, we will schedule her for cardiac apposition.   Current Outpatient Prescriptions on File Prior to Visit   Medication  Sig  Dispense  Refill   .  ALPRAZolam (XANAX) 0.25 MG tablet  Take 0.25 mg by mouth at bedtime as needed.     .  aspirin 81 MG tablet  Take 81 mg by mouth daily.     .  benazepril (LOTENSIN) 10 MG tablet  Take 10 mg by mouth daily.     .  carvedilol (COREG) 3.125 MG tablet  take 1 tablet twice a day  60 tablet  5   .  escitalopram (LEXAPRO) 10 MG tablet  Take 10 mg by mouth daily.     .  esomeprazole (NEXIUM) 40 MG capsule  Take 40 mg by mouth daily before breakfast.     .  ezetimibe-simvastatin (VYTORIN) 10-20 MG per tablet  Take 1 tablet by mouth at bedtime.     .  fentaNYL (DURAGESIC) 100 MCG/HR  Place 1 patch onto the skin every 3 (three) days.     .  Fluticasone-Salmeterol (ADVAIR DISKUS IN)  Inhale into the lungs as directed.     .  glyBURIDE-metformin (GLUCOVANCE) 5-500 MG per tablet  Take 2 tablets by mouth 2 (two) times daily with a meal.     .  insulin aspart (NOVOLOG) 100 UNIT/ML injection  Inject into the skin as directed.      .  insulin glargine (LANTUS) 100 UNIT/ML injection  Inject 30 Units into the skin as directed.     .  iron polysaccharides (NIFEREX) 150 MG capsule  Take 150 mg by mouth daily.     .  oxyCODONE-acetaminophen (PERCOCET) 10-325 MG per tablet  Take 1 tablet by mouth every 4 (four) hours as needed.     .  torsemide (DEMADEX) 20 MG tablet  take 1 tablet by mouth once daily  30 tablet  5   .  warfarin (COUMADIN) 5 MG tablet  Take 5 mg by mouth daily.     .  zolpidem (AMBIEN) 10 MG tablet  Take 10 mg by mouth at bedtime as needed.      Allergies   Allergen  Reactions   .  Codeine    .  Demerol    .  Penicillins     Past Medical History   Diagnosis  Date   .  CHF (congestive heart failure)      chronic systolic CHF   .  Hypertension    .  Atrial fibrillation    .  Obesity    .  Coronary   artery disease      cardiac catheterization in 1997 -- Est. EF of 50%   .  Fatigue    .  Stress    .  Chest pain    .  Shortness of breath    .  Peptic ulcer  1985    Past Surgical History   Procedure  Date   .  Cardiac catheterization  02/21/96     Est. EF of 50%   .  Insert / replace / remove pacemaker  07/23/99     atrial insertion of the AV node -- Medtronic single chamber pacemaker   .  Vesicovaginal fistula closure w/ tah  1995   .  Tonsillectomy     History   Smoking status   .  Former Smoker   .  Quit date:  03/28/1989   Smokeless tobacco   .  Not on file    History   Alcohol Use  No    Family History   Problem  Relation  Age of Onset   .  Heart disease  Mother    .  Hypertension  Mother    Reviw of Systems:  Reviewed in the HPI. All other systems are negative.  Physical Exam:  BP 126/68  Pulse 65  Wt 235 lb 6.4 oz (106.777 kg)  SpO2 98%  The patient is alert and oriented x 3. The mood and affect are normal.  Skin: warm and dry. Color is pale.  HEENT: Her neck is supple. There is no JVD. Carotids are normal. Her mucous membranes are moist.  Lungs: Clear to patient  Heart:  Regular rate S1-S2. She is a soft systolic murmur  Abdomen: Mild obesity. She has good bowel sounds.  Extremities: No clubbing cyanosis or edema  Neuro: Nonfocal. Her gait is somewhat slow but is otherwise normal.  ECG:  Ventricular pacing  Assessment / Plan:  #1. Coronary artery disease: Joyce Terry presents with increasing shortness of breath with some chest discomfort. The Myoview study reveals the presence of an anterior defect. I cannot completely rule out a breast artifact but given her symptoms and a mild defect, I'm inclined to proceed with cardiac catheterization. We discussed the risks, benefits, and options. She understands and agrees to proceed.  She's currently on Coumadin. Her last INR was 2.7. We will have her hold her Coumadin today. We'll have her eat fresh spinach for the next several days and we'll recheck her INR and other labs on Monday. We'll schedule this with Dr. Jordan. If her INR still elevated, he may be able to use a radial approach.     

## 2011-02-11 NOTE — H&P (Signed)
Joyce Terry  Date of Birth 1935-12-25  [facility]  [address]  [place], [place] [address]  [phone] Fax [phone]    History of Present Illness:  75 year old female with a history of coronary artery disease. She also has a pacemaker. She has been having severe dyspnea on exertion for the past several months. She states that walking as little as 50 feet can cause her to be severely dyspneic. She feels fine at rest. She also complains of diffuse muscle aches.  She's been having more and more dyspnea with exertion. We schedule her for her for a stress Myoview study. She was found to have a reversible apical defect. She has known mild to moderate coronary artery disease from a heart cath 6 years ago. Her ejection fraction was normal at 66%.  Because of her progressive symptoms and a fairly normal ejection fraction, we will schedule her for cardiac apposition.   Current Outpatient Prescriptions on File Prior to Visit   Medication  Sig  Dispense  Refill   .  ALPRAZolam (XANAX) 0.25 MG tablet  Take 0.25 mg by mouth at bedtime as needed.     Marland Kitchen  aspirin 81 MG tablet  Take 81 mg by mouth daily.     .  benazepril (LOTENSIN) 10 MG tablet  Take 10 mg by mouth daily.     .  carvedilol (COREG) 3.125 MG tablet  take 1 tablet twice a day  60 tablet  5   .  escitalopram (LEXAPRO) 10 MG tablet  Take 10 mg by mouth daily.     Marland Kitchen  esomeprazole (NEXIUM) 40 MG capsule  Take 40 mg by mouth daily before breakfast.     .  ezetimibe-simvastatin (VYTORIN) 10-20 MG per tablet  Take 1 tablet by mouth at bedtime.     .  fentaNYL (DURAGESIC) 100 MCG/HR  Place 1 patch onto the skin every 3 (three) days.     .  Fluticasone-Salmeterol (ADVAIR DISKUS IN)  Inhale into the lungs as directed.     .  glyBURIDE-metformin (GLUCOVANCE) 5-500 MG per tablet  Take 2 tablets by mouth 2 (two) times daily with a meal.     .  insulin aspart (NOVOLOG) 100 UNIT/ML injection  Inject into the skin as directed.      .  insulin glargine (LANTUS) 100 UNIT/ML injection  Inject 30 Units into the skin as directed.     .  iron polysaccharides (NIFEREX) 150 MG capsule  Take 150 mg by mouth daily.     Marland Kitchen  oxyCODONE-acetaminophen (PERCOCET) 10-325 MG per tablet  Take 1 tablet by mouth every 4 (four) hours as needed.     .  torsemide (DEMADEX) 20 MG tablet  take 1 tablet by mouth once daily  30 tablet  5   .  warfarin (COUMADIN) 5 MG tablet  Take 5 mg by mouth daily.     Marland Kitchen  zolpidem (AMBIEN) 10 MG tablet  Take 10 mg by mouth at bedtime as needed.      Allergies   Allergen  Reactions   .  Codeine    .  Demerol    .  Penicillins     Past Medical History   Diagnosis  Date   .  CHF (congestive heart failure)      chronic systolic CHF   .  Hypertension    .  Atrial fibrillation    .  Obesity    .  Coronary  artery disease      cardiac catheterization in 1997 -- Est. EF of 50%   .  Fatigue    .  Stress    .  Chest pain    .  Shortness of breath    .  Peptic ulcer  1985    Past Surgical History   Procedure  Date   .  Cardiac catheterization  02/21/96     Est. EF of 50%   .  Insert / replace / remove pacemaker  07/23/99     atrial insertion of the AV node -- Medtronic single chamber pacemaker   .  Vesicovaginal fistula closure w/ tah  1995   .  Tonsillectomy     History   Smoking status   .  Former Smoker   .  Quit date:  03/28/1989   Smokeless tobacco   .  Not on file    History   Alcohol Use  No    Family History   Problem  Relation  Age of Onset   .  Heart disease  Mother    .  Hypertension  Mother    Reviw of Systems:  Reviewed in the HPI. All other systems are negative.  Physical Exam:  BP 126/68  Pulse 65  Wt 235 lb 6.4 oz (106.777 kg)  SpO2 98%  The patient is alert and oriented x 3. The mood and affect are normal.  Skin: warm and dry. Color is pale.  HEENT: Her neck is supple. There is no JVD. Carotids are normal. Her mucous membranes are moist.  Lungs: Clear to patient  Heart:  Regular rate S1-S2. She is a soft systolic murmur  Abdomen: Mild obesity. She has good bowel sounds.  Extremities: No clubbing cyanosis or edema  Neuro: Nonfocal. Her gait is somewhat slow but is otherwise normal.  ECG:  Ventricular pacing  Assessment / Plan:  #1. Coronary artery disease: Joyce Terry presents with increasing shortness of breath with some chest discomfort. The Myoview study reveals the presence of an anterior defect. I cannot completely rule out a breast artifact but given her symptoms and a mild defect, I'm inclined to proceed with cardiac catheterization. We discussed the risks, benefits, and options. She understands and agrees to proceed.  She's currently on Coumadin. Her last INR was 2.7. We will have her hold her Coumadin today. We'll have her eat fresh spinach for the next several days and we'll recheck her INR and other labs on Monday. We'll schedule this with Dr. Swaziland. If her INR still elevated, he may be able to use a radial approach.

## 2011-02-11 NOTE — Telephone Encounter (Signed)
Discussed positive nuc study, pt will proceed with LHC, to discuss with dr Elease Hashimoto.

## 2011-02-14 ENCOUNTER — Ambulatory Visit (INDEPENDENT_AMBULATORY_CARE_PROVIDER_SITE_OTHER): Payer: Medicare Other | Admitting: *Deleted

## 2011-02-14 ENCOUNTER — Telehealth: Payer: Self-pay | Admitting: *Deleted

## 2011-02-14 ENCOUNTER — Other Ambulatory Visit: Payer: Self-pay | Admitting: Cardiovascular Disease

## 2011-02-14 DIAGNOSIS — E119 Type 2 diabetes mellitus without complications: Secondary | ICD-10-CM

## 2011-02-14 DIAGNOSIS — I4891 Unspecified atrial fibrillation: Secondary | ICD-10-CM

## 2011-02-14 DIAGNOSIS — R0600 Dyspnea, unspecified: Secondary | ICD-10-CM

## 2011-02-14 DIAGNOSIS — I509 Heart failure, unspecified: Secondary | ICD-10-CM

## 2011-02-14 LAB — CBC WITH DIFFERENTIAL/PLATELET
Basophils Relative: 0.2 % (ref 0.0–3.0)
Eosinophils Absolute: 0.1 10*3/uL (ref 0.0–0.7)
HCT: 28.3 % — ABNORMAL LOW (ref 36.0–46.0)
Hemoglobin: 8.8 g/dL — ABNORMAL LOW (ref 12.0–15.0)
Lymphocytes Relative: 10.7 % — ABNORMAL LOW (ref 12.0–46.0)
Lymphs Abs: 1 10*3/uL (ref 0.7–4.0)
MCHC: 31 g/dL (ref 30.0–36.0)
MCV: 65 fl — ABNORMAL LOW (ref 78.0–100.0)
Neutro Abs: 7.4 10*3/uL (ref 1.4–7.7)
RBC: 4.36 Mil/uL (ref 3.87–5.11)

## 2011-02-14 LAB — PROTIME-INR: INR: 1.3 ratio — ABNORMAL HIGH (ref 0.8–1.0)

## 2011-02-14 LAB — BASIC METABOLIC PANEL
CO2: 25 mEq/L (ref 19–32)
Calcium: 8.6 mg/dL (ref 8.4–10.5)
Chloride: 105 mEq/L (ref 96–112)
Potassium: 4.2 mEq/L (ref 3.5–5.1)
Sodium: 139 mEq/L (ref 135–145)

## 2011-02-14 NOTE — Telephone Encounter (Signed)
Dr Elease Hashimoto called with lab results, they are not in his box yet, msg left that the inr is 1.3 and creat was 1.3.

## 2011-02-15 ENCOUNTER — Ambulatory Visit (HOSPITAL_COMMUNITY)
Admission: AD | Admit: 2011-02-15 | Discharge: 2011-02-16 | Disposition: A | Discharge status: Home / Self Care | Payer: Medicare Other | Source: Ambulatory Visit | Attending: Cardiovascular Disease | Admitting: Cardiovascular Disease

## 2011-02-15 ENCOUNTER — Encounter (HOSPITAL_BASED_OUTPATIENT_CLINIC_OR_DEPARTMENT_OTHER)
Admission: AD | Disposition: A | Discharge status: Home / Self Care | Payer: Self-pay | Source: Ambulatory Visit | Attending: Cardiology

## 2011-02-15 ENCOUNTER — Encounter (HOSPITAL_COMMUNITY): Payer: Self-pay | Admitting: General Practice

## 2011-02-15 ENCOUNTER — Ambulatory Visit (HOSPITAL_COMMUNITY): Admit: 2011-02-15 | Payer: Self-pay | Admitting: Cardiovascular Disease

## 2011-02-15 ENCOUNTER — Encounter (HOSPITAL_COMMUNITY)
Admission: AD | Disposition: A | Discharge status: Home / Self Care | Payer: Self-pay | Source: Ambulatory Visit | Attending: Cardiovascular Disease

## 2011-02-15 ENCOUNTER — Inpatient Hospital Stay (HOSPITAL_COMMUNITY)
Admission: AD | Admit: 2011-02-15 | Discharge: 2011-02-15 | Disposition: A | Discharge status: Home / Self Care | Payer: Medicare Other | Source: Ambulatory Visit | Attending: Cardiology | Admitting: Cardiology

## 2011-02-15 ENCOUNTER — Other Ambulatory Visit: Payer: Self-pay

## 2011-02-15 DIAGNOSIS — R0600 Dyspnea, unspecified: Secondary | ICD-10-CM

## 2011-02-15 DIAGNOSIS — I509 Heart failure, unspecified: Secondary | ICD-10-CM | POA: Insufficient documentation

## 2011-02-15 DIAGNOSIS — D509 Iron deficiency anemia, unspecified: Secondary | ICD-10-CM | POA: Insufficient documentation

## 2011-02-15 DIAGNOSIS — R0609 Other forms of dyspnea: Secondary | ICD-10-CM | POA: Insufficient documentation

## 2011-02-15 DIAGNOSIS — I4891 Unspecified atrial fibrillation: Secondary | ICD-10-CM | POA: Insufficient documentation

## 2011-02-15 DIAGNOSIS — I5032 Chronic diastolic (congestive) heart failure: Secondary | ICD-10-CM | POA: Insufficient documentation

## 2011-02-15 DIAGNOSIS — I2510 Atherosclerotic heart disease of native coronary artery without angina pectoris: Secondary | ICD-10-CM | POA: Insufficient documentation

## 2011-02-15 DIAGNOSIS — Z7901 Long term (current) use of anticoagulants: Secondary | ICD-10-CM | POA: Insufficient documentation

## 2011-02-15 DIAGNOSIS — I200 Unstable angina: Secondary | ICD-10-CM | POA: Insufficient documentation

## 2011-02-15 DIAGNOSIS — I5022 Chronic systolic (congestive) heart failure: Secondary | ICD-10-CM | POA: Insufficient documentation

## 2011-02-15 DIAGNOSIS — E669 Obesity, unspecified: Secondary | ICD-10-CM | POA: Insufficient documentation

## 2011-02-15 DIAGNOSIS — R079 Chest pain, unspecified: Secondary | ICD-10-CM | POA: Insufficient documentation

## 2011-02-15 DIAGNOSIS — Z950 Presence of cardiac pacemaker: Secondary | ICD-10-CM | POA: Insufficient documentation

## 2011-02-15 DIAGNOSIS — E119 Type 2 diabetes mellitus without complications: Secondary | ICD-10-CM | POA: Insufficient documentation

## 2011-02-15 DIAGNOSIS — R0602 Shortness of breath: Secondary | ICD-10-CM | POA: Insufficient documentation

## 2011-02-15 DIAGNOSIS — R0989 Other specified symptoms and signs involving the circulatory and respiratory systems: Secondary | ICD-10-CM | POA: Insufficient documentation

## 2011-02-15 DIAGNOSIS — R9439 Abnormal result of other cardiovascular function study: Secondary | ICD-10-CM | POA: Insufficient documentation

## 2011-02-15 HISTORY — PX: CORONARY ANGIOPLASTY WITH STENT PLACEMENT: SHX49

## 2011-02-15 HISTORY — DX: Anemia, unspecified: D64.9

## 2011-02-15 HISTORY — DX: Gastro-esophageal reflux disease without esophagitis: K21.9

## 2011-02-15 HISTORY — DX: Major depressive disorder, single episode, unspecified: F32.9

## 2011-02-15 HISTORY — DX: Angina pectoris, unspecified: I20.9

## 2011-02-15 HISTORY — DX: Depression, unspecified: F32.A

## 2011-02-15 HISTORY — PX: PERCUTANEOUS CORONARY STENT INTERVENTION (PCI-S): SHX5485

## 2011-02-15 LAB — GLUCOSE, CAPILLARY
Glucose-Capillary: 62 mg/dL — ABNORMAL LOW (ref 70–99)
Glucose-Capillary: 181 mg/dL — ABNORMAL HIGH (ref 70–99)

## 2011-02-15 SURGERY — PERCUTANEOUS CORONARY STENT INTERVENTION (PCI-S)
Anesthesia: LOCAL

## 2011-02-15 SURGERY — JV LEFT HEART CATHETERIZATION WITH CORONARY ANGIOGRAM
Anesthesia: Moderate Sedation

## 2011-02-15 MED ORDER — ACETAMINOPHEN 325 MG PO TABS: 650.0000 mg | ORAL_TABLET | ORAL | Status: DC | PRN

## 2011-02-15 MED ORDER — ZOLPIDEM TARTRATE 5 MG PO TABS: 5.0000 mg | ORAL_TABLET | Freq: Every evening | ORAL | Status: DC | PRN

## 2011-02-15 MED ORDER — SODIUM CHLORIDE 0.9 % IV SOLN: 250.0000 mL | INTRAVENOUS | Status: DC

## 2011-02-15 MED ORDER — DIAZEPAM 5 MG PO TABS: 5.0000 mg | ORAL_TABLET | ORAL | Status: DC

## 2011-02-15 MED ORDER — TORSEMIDE 20 MG PO TABS
20.0000 mg | ORAL_TABLET | Freq: Once | ORAL | Status: AC
  Administered 2011-02-15: 20 mg via ORAL
  Filled 2011-02-15: qty 1

## 2011-02-15 MED ORDER — ASPIRIN 81 MG PO CHEW: 324.0000 mg | CHEWABLE_TABLET | ORAL | Status: DC

## 2011-02-15 MED ORDER — SODIUM CHLORIDE 0.9 % IJ SOLN: 3.0000 mL | Freq: Two times a day (BID) | INTRAMUSCULAR | Status: DC

## 2011-02-15 MED ORDER — FENTANYL 100 MCG/HR TD PT72: 100.0000 ug | MEDICATED_PATCH | TRANSDERMAL | Status: DC

## 2011-02-15 MED ORDER — OXYCODONE-ACETAMINOPHEN 10-325 MG PO TABS: 1.0000 | ORAL_TABLET | ORAL | Status: DC | PRN

## 2011-02-15 MED ORDER — CARVEDILOL 3.125 MG PO TABS
3.1250 mg | ORAL_TABLET | Freq: Two times a day (BID) | ORAL | Status: DC
  Administered 2011-02-15 – 2011-02-16 (×2): 3.125 mg via ORAL
  Filled 2011-02-15 (×4): qty 1

## 2011-02-15 MED ORDER — POLYSACCHARIDE IRON COMPLEX 150 MG PO CAPS: 150.0000 mg | ORAL_CAPSULE | Freq: Every day | ORAL | Status: DC

## 2011-02-15 MED ORDER — DIAZEPAM 5 MG PO TABS
5.0000 mg | ORAL_TABLET | ORAL | Status: AC
  Administered 2011-02-15: 5 mg via ORAL

## 2011-02-15 MED ORDER — MORPHINE SULFATE 4 MG/ML IJ SOLN
INTRAMUSCULAR | Status: AC
  Filled 2011-02-15: qty 1

## 2011-02-15 MED ORDER — ONDANSETRON HCL 4 MG/2ML IJ SOLN: 4.0000 mg | Freq: Four times a day (QID) | INTRAMUSCULAR | Status: DC | PRN

## 2011-02-15 MED ORDER — MORPHINE SULFATE 2 MG/ML IJ SOLN
4.0000 mg | INTRAMUSCULAR | Status: DC | PRN
  Administered 2011-02-15: 4 mg via INTRAVENOUS

## 2011-02-15 MED ORDER — BENAZEPRIL HCL 10 MG PO TABS
10.0000 mg | ORAL_TABLET | Freq: Every day | ORAL | Status: DC
  Administered 2011-02-15 – 2011-02-16 (×2): 10 mg via ORAL
  Filled 2011-02-15 (×2): qty 1

## 2011-02-15 MED ORDER — SODIUM CHLORIDE 0.9 % IV SOLN: INTRAVENOUS | Status: DC

## 2011-02-15 MED ORDER — HEPARIN (PORCINE) IN NACL 2-0.9 UNIT/ML-% IJ SOLN
INTRAMUSCULAR | Status: AC
  Filled 2011-02-15: qty 2000

## 2011-02-15 MED ORDER — FAMOTIDINE IN NACL 20-0.9 MG/50ML-% IV SOLN
20.0000 mg | Freq: Once | INTRAVENOUS | Status: AC
  Administered 2011-02-15: 20 mg via INTRAVENOUS
  Filled 2011-02-15: qty 50

## 2011-02-15 MED ORDER — FENTANYL CITRATE 0.05 MG/ML IJ SOLN
INTRAMUSCULAR | Status: AC
  Filled 2011-02-15: qty 2

## 2011-02-15 MED ORDER — OXYCODONE-ACETAMINOPHEN 5-325 MG PO TABS
2.0000 | ORAL_TABLET | Freq: Four times a day (QID) | ORAL | Status: DC | PRN
  Administered 2011-02-15 – 2011-02-16 (×3): 2 via ORAL
  Filled 2011-02-15 (×4): qty 2

## 2011-02-15 MED ORDER — SODIUM CHLORIDE 0.9 % IV SOLN
INTRAVENOUS | Status: DC
  Administered 2011-02-15: 09:00:00 via INTRAVENOUS

## 2011-02-15 MED ORDER — HEPARIN BOLUS VIA INFUSION
5000.0000 [IU] | Freq: Once | INTRAVENOUS | Status: AC
  Administered 2011-02-15: 5000 [IU] via INTRAVENOUS

## 2011-02-15 MED ORDER — FAMOTIDINE IN NACL 20-0.9 MG/50ML-% IV SOLN
20.0000 mg | Freq: Two times a day (BID) | INTRAVENOUS | Status: DC
  Administered 2011-02-15: 20 mg via INTRAVENOUS

## 2011-02-15 MED ORDER — ZOLPIDEM TARTRATE 10 MG PO TABS: 10.0000 mg | ORAL_TABLET | Freq: Every evening | ORAL | Status: DC | PRN

## 2011-02-15 MED ORDER — CLOPIDOGREL BISULFATE 300 MG PO TABS
600.0000 mg | ORAL_TABLET | Freq: Once | ORAL | Status: AC
  Administered 2011-02-15: 600 mg via ORAL

## 2011-02-15 MED ORDER — BENAZEPRIL HCL 10 MG PO TABS: 10.0000 mg | ORAL_TABLET | Freq: Every day | ORAL | Status: DC

## 2011-02-15 MED ORDER — EZETIMIBE-SIMVASTATIN 10-20 MG PO TABS
1.0000 | ORAL_TABLET | Freq: Every day | ORAL | Status: DC
  Administered 2011-02-15: 1 via ORAL
  Filled 2011-02-15 (×2): qty 1

## 2011-02-15 MED ORDER — ESCITALOPRAM OXALATE 10 MG PO TABS: 10.0000 mg | ORAL_TABLET | Freq: Every day | ORAL | Status: DC

## 2011-02-15 MED ORDER — ASPIRIN 81 MG PO TABS: 81.0000 mg | ORAL_TABLET | Freq: Every day | ORAL | Status: DC

## 2011-02-15 MED ORDER — SODIUM CHLORIDE 0.9 % IJ SOLN: 3.0000 mL | INTRAMUSCULAR | Status: DC | PRN

## 2011-02-15 MED ORDER — ESCITALOPRAM OXALATE 10 MG PO TABS
10.0000 mg | ORAL_TABLET | Freq: Every day | ORAL | Status: DC
  Administered 2011-02-15 – 2011-02-16 (×2): 10 mg via ORAL
  Filled 2011-02-15 (×2): qty 1

## 2011-02-15 MED ORDER — DIPHENHYDRAMINE HCL 25 MG PO CAPS: 25.0000 mg | ORAL_CAPSULE | Freq: Four times a day (QID) | ORAL | Status: DC | PRN

## 2011-02-15 MED ORDER — NITROGLYCERIN 0.2 MG/ML ON CALL CATH LAB
INTRAVENOUS | Status: AC
  Filled 2011-02-15: qty 1

## 2011-02-15 MED ORDER — BIVALIRUDIN 250 MG IV SOLR
INTRAVENOUS | Status: AC
  Filled 2011-02-15: qty 250

## 2011-02-15 MED ORDER — EZETIMIBE-SIMVASTATIN 10-20 MG PO TABS: 1.0000 | ORAL_TABLET | Freq: Every day | ORAL | Status: DC

## 2011-02-15 MED ORDER — CLOPIDOGREL BISULFATE 300 MG PO TABS: 300.0000 mg | ORAL_TABLET | Freq: Once | ORAL | Status: DC

## 2011-02-15 MED ORDER — DIPHENHYDRAMINE HCL 25 MG PO CAPS: 25.0000 mg | ORAL_CAPSULE | ORAL | Status: DC

## 2011-02-15 MED ORDER — CARVEDILOL 3.125 MG PO TABS: 3.1250 mg | ORAL_TABLET | Freq: Two times a day (BID) | ORAL | Status: DC

## 2011-02-15 MED ORDER — INSULIN GLARGINE 100 UNIT/ML ~~LOC~~ SOLN: 30.0000 [IU] | SUBCUTANEOUS | Status: DC

## 2011-02-15 MED ORDER — FLUTICASONE-SALMETEROL 250-50 MCG/DOSE IN AEPB: 1.0000 | INHALATION_SPRAY | Freq: Two times a day (BID) | RESPIRATORY_TRACT | Status: DC

## 2011-02-15 MED ORDER — MIDAZOLAM HCL 2 MG/2ML IJ SOLN
INTRAMUSCULAR | Status: AC
  Filled 2011-02-15: qty 2

## 2011-02-15 MED ORDER — SODIUM CHLORIDE 0.9 % IV SOLN
INTRAVENOUS | Status: AC
  Administered 2011-02-15: 1000 mL via INTRAVENOUS

## 2011-02-15 MED ORDER — CLOPIDOGREL BISULFATE 75 MG PO TABS
75.0000 mg | ORAL_TABLET | Freq: Every day | ORAL | Status: DC
  Administered 2011-02-16: 75 mg via ORAL
  Filled 2011-02-15: qty 1

## 2011-02-15 MED ORDER — LIDOCAINE HCL (PF) 1 % IJ SOLN
INTRAMUSCULAR | Status: AC
  Filled 2011-02-15: qty 30

## 2011-02-15 MED ORDER — INSULIN GLARGINE 100 UNIT/ML ~~LOC~~ SOLN
30.0000 [IU] | Freq: Every day | SUBCUTANEOUS | Status: DC
  Administered 2011-02-15: 30 [IU] via SUBCUTANEOUS
  Filled 2011-02-15: qty 3

## 2011-02-15 MED ORDER — TORSEMIDE 20 MG PO TABS: 20.0000 mg | ORAL_TABLET | Freq: Every day | ORAL | Status: DC

## 2011-02-15 MED ORDER — ALPRAZOLAM 0.25 MG PO TABS: 0.2500 mg | ORAL_TABLET | Freq: Every evening | ORAL | Status: DC | PRN

## 2011-02-15 MED ORDER — MORPHINE SULFATE 2 MG/ML IJ SOLN: 2.0000 mg | INTRAMUSCULAR | Status: DC | PRN

## 2011-02-15 MED ORDER — PANTOPRAZOLE SODIUM 40 MG PO TBEC: 40.0000 mg | DELAYED_RELEASE_TABLET | Freq: Every day | ORAL | Status: DC

## 2011-02-15 NOTE — Op Note (Signed)
Cardiac Catheterization Operative Report  ANY MCNEICE [id number] 11/20/20121:26 PM ARONSON,RICHARD A, MD, MD  Procedure Performed:  1. PTCA/DES x 1 LAD  Operator: Verne Carrow, MD  Indication: Unstable angina. Abnormal Stress test. CAD by cath per DR. Swaziland this am. Severe mid LAD stenosis. I was asked to perform the PCI.                                     Procedure Details: The risks, benefits, complications, treatment options, and expected outcomes were discussed with the patient. The patient and/or family concurred with the proposed plan, giving informed consent. The patient was brought to the main cath lab from the outpatient cath lab. A 4 French sheath was in place in the right groin. She had been loaded with 600 mg po Plavix x 1 and given an IV heparin bolus.  The patient was further sedated with Versed and Fentanyl. The right groin was prepped and draped in a sterile fashion. The sheath was changed for a 6 French sheath.   An XB LAD 3.5 guiding catheter was used to selectively engage the left main artery. The patient was given a bolus of angiomax and a drip was started. When the ACT was >200, I passed a Cougar IC wire down the LAD. A 2.5 x 12 mm balloon was used to predilate the lesion. A 2.75 x 14 mm Resolute Integrity DES was deployed in the mid vessel. A 2.75 x 8 mm [place] balloon was used to post-dilate the distal end of the stent. A 3.25 x 8 mm Tennyson balloon was used to post-dilate the proximal edge of the stent. There was an excellent angiographic result with good flow into the distal vessel. The diagonal branch had excellent flow.   There were no immediate complications. The patient was taken to the recovery area in stable condition.     Hemodynamic Findings: Central aortic pressure: 142/67   Impression: 1. Successful PTCA/DES x 1 mid LAD  Recommendations: I would continue Plavix for at least one year. She will be restarted on coumadin tomorrow before discharge. Given  her need for chronic coumadin therapy with atrial fibrillation, would not recommend using ASA.        Complications:  None; patient tolerated the procedure well.

## 2011-02-15 NOTE — H&P (View-Only) (Signed)
Joyce Terry  Date of Birth 06/22/1935  [facility]  [address]  [place], [place] [address]  [phone] Fax [phone]    History of Present Illness:  75-year-old female with a history of coronary artery disease. She also has a pacemaker. She has been having severe dyspnea on exertion for the past several months. She states that walking as little as 50 feet can cause her to be severely dyspneic. She feels fine at rest. She also complains of diffuse muscle aches.  She's been having more and more dyspnea with exertion. We schedule her for her for a stress Myoview study. She was found to have a reversible apical defect. She has known mild to moderate coronary artery disease from a heart cath 6 years ago. Her ejection fraction was normal at 66%.  Because of her progressive symptoms and a fairly normal ejection fraction, we will schedule her for cardiac apposition.   Current Outpatient Prescriptions on File Prior to Visit   Medication  Sig  Dispense  Refill   .  ALPRAZolam (XANAX) 0.25 MG tablet  Take 0.25 mg by mouth at bedtime as needed.     .  aspirin 81 MG tablet  Take 81 mg by mouth daily.     .  benazepril (LOTENSIN) 10 MG tablet  Take 10 mg by mouth daily.     .  carvedilol (COREG) 3.125 MG tablet  take 1 tablet twice a day  60 tablet  5   .  escitalopram (LEXAPRO) 10 MG tablet  Take 10 mg by mouth daily.     .  esomeprazole (NEXIUM) 40 MG capsule  Take 40 mg by mouth daily before breakfast.     .  ezetimibe-simvastatin (VYTORIN) 10-20 MG per tablet  Take 1 tablet by mouth at bedtime.     .  fentaNYL (DURAGESIC) 100 MCG/HR  Place 1 patch onto the skin every 3 (three) days.     .  Fluticasone-Salmeterol (ADVAIR DISKUS IN)  Inhale into the lungs as directed.     .  glyBURIDE-metformin (GLUCOVANCE) 5-500 MG per tablet  Take 2 tablets by mouth 2 (two) times daily with a meal.     .  insulin aspart (NOVOLOG) 100 UNIT/ML injection  Inject into the skin as directed.      .  insulin glargine (LANTUS) 100 UNIT/ML injection  Inject 30 Units into the skin as directed.     .  iron polysaccharides (NIFEREX) 150 MG capsule  Take 150 mg by mouth daily.     .  oxyCODONE-acetaminophen (PERCOCET) 10-325 MG per tablet  Take 1 tablet by mouth every 4 (four) hours as needed.     .  torsemide (DEMADEX) 20 MG tablet  take 1 tablet by mouth once daily  30 tablet  5   .  warfarin (COUMADIN) 5 MG tablet  Take 5 mg by mouth daily.     .  zolpidem (AMBIEN) 10 MG tablet  Take 10 mg by mouth at bedtime as needed.      Allergies   Allergen  Reactions   .  Codeine    .  Demerol    .  Penicillins     Past Medical History   Diagnosis  Date   .  CHF (congestive heart failure)      chronic systolic CHF   .  Hypertension    .  Atrial fibrillation    .  Obesity    .  Coronary   artery disease      cardiac catheterization in 1997 -- Est. EF of 50%   .  Fatigue    .  Stress    .  Chest pain    .  Shortness of breath    .  Peptic ulcer  1985    Past Surgical History   Procedure  Date   .  Cardiac catheterization  02/21/96     Est. EF of 50%   .  Insert / replace / remove pacemaker  07/23/99     atrial insertion of the AV node -- Medtronic single chamber pacemaker   .  Vesicovaginal fistula closure w/ tah  1995   .  Tonsillectomy     History   Smoking status   .  Former Smoker   .  Quit date:  03/28/1989   Smokeless tobacco   .  Not on file    History   Alcohol Use  No    Family History   Problem  Relation  Age of Onset   .  Heart disease  Mother    .  Hypertension  Mother    Reviw of Systems:  Reviewed in the HPI. All other systems are negative.  Physical Exam:  BP 126/68  Pulse 65  Wt 235 lb 6.4 oz (106.777 kg)  SpO2 98%  The patient is alert and oriented x 3. The mood and affect are normal.  Skin: warm and dry. Color is pale.  HEENT: Her neck is supple. There is no JVD. Carotids are normal. Her mucous membranes are moist.  Lungs: Clear to patient  Heart:  Regular rate S1-S2. She is a soft systolic murmur  Abdomen: Mild obesity. She has good bowel sounds.  Extremities: No clubbing cyanosis or edema  Neuro: Nonfocal. Her gait is somewhat slow but is otherwise normal.  ECG:  Ventricular pacing  Assessment / Plan:  #1. Coronary artery disease: Joyce Terry presents with increasing shortness of breath with some chest discomfort. The Myoview study reveals the presence of an anterior defect. I cannot completely rule out a breast artifact but given her symptoms and a mild defect, I'm inclined to proceed with cardiac catheterization. We discussed the risks, benefits, and options. She understands and agrees to proceed.  She's currently on Coumadin. Her last INR was 2.7. We will have her hold her Coumadin today. We'll have her eat fresh spinach for the next several days and we'll recheck her INR and other labs on Monday. We'll schedule this with Dr. Jordan. If her INR still elevated, he may be able to use a radial approach.     

## 2011-02-15 NOTE — Op Note (Signed)
Cardiac Catheterization Procedure Note  Name: Joyce Terry MRN: [id number] DOB: Mar 09, 1936  Procedure: Left Heart Cath, Selective Coronary Angiography, LV angiography  Indication: 75 yo WF with progressive dyspnea on exertion. Stress myoview shows apical ischemia.   Procedural details: The right groin was prepped, draped, and anesthetized with 1% lidocaine. Using modified Seldinger technique, a 4 French sheath was introduced into the right femoral artery. Standard Judkins catheters were used for coronary angiography and left ventriculography. Catheter exchanges were performed over a guidewire. There were no immediate procedural complications. The patient was transferred to the post catheterization recovery area for further monitoring.  Procedural Findings: Hemodynamics:  AO 153/63 LV 153/20   Coronary angiography: Coronary dominance: right  Left mainstem: Normal  Left anterior descending (LAD): Focal 90% lesion in the mid vessel immediately after the takeoff of a moderately large diagonal branch.  Left circumflex (LCx): Mild disease 20% in the mid vessel.  Right coronary artery (RCA): Mild diffuse wall irregularities.  Left ventriculography: Left ventricular systolic function is normal, LVEF is estimated at 55%, there is no significant mitral regurgitation. There is apical hypokinesis.  Final Conclusions:    1. Single vessel obstructive CAD involving the mid LAD.  Recommendations:  Recommend stenting of the LAD  Tamim Skog Swaziland 02/15/2011, 10:45 AM

## 2011-02-15 NOTE — Progress Notes (Signed)
HYPOGLYCEMIC EVENT @ 1650  CBG: 62  TREATMENT: 15 GM carbohydrate snack  SYMPTOMS: None  FOLLOW-UP CBG:    Time:  1705 CBG Result:76  POSSIBLE REASONS FOR EVENT: Inadequate meal intake  COMMENTS/MD NOTIFIED:  Ward Givens, NP notified.

## 2011-02-15 NOTE — OR Nursing (Signed)
Transported to main cath lab for PCI via stretcher and monitor.

## 2011-02-15 NOTE — Progress Notes (Signed)
Site area: right groin  Site Prior to Removal:  Level 0  Pressure Applied For 25 MINUTES    Minutes Beginning at 1600  Manual:   yes  Patient Status During Pull:  VSS  Post Pull Groin Site:  Level 0  Post Pull Instructions Given:  yes  Post Pull Pulses Present:  yes  Dressing Applied:  yes  Comments:

## 2011-02-15 NOTE — Interval H&P Note (Signed)
History and Physical Interval Note:   02/15/2011   12:40 PM   The patient underwent left heart cath this am per DR. Swaziland. I am asked to perform the PCI of the LAD.  I have personally reviewed the labs and examined the patient. The History and Physical has been reviewed and there are no changes. The risks and benefits of the procedure have been reviewed with the patient. Informed consent has been signed by the patient and is present in the chart. The patient agrees to proceed with the procedure.    Om Lizotte  MD

## 2011-02-15 NOTE — Interval H&P Note (Signed)
History and Physical Interval Note:   02/15/2011   10:05 AM   Joyce Terry  has presented today for surgery, with the diagnosis of chest pain  The various methods of treatment have been discussed with the patient and family. After consideration of risks, benefits and other options for treatment, the patient has consented to  Procedure(s): JV LEFT HEART CATHETERIZATION WITH CORONARY ANGIOGRAM as a surgical intervention .  The patients' history has been reviewed, patient examined, no change in status, stable for surgery.  I have reviewed the patients' chart and labs.  Questions were answered to the patient's satisfaction.     Peter Swaziland  MD

## 2011-02-15 NOTE — H&P (View-Only) (Signed)
Joyce Terry  Date of Birth 03/02/1936  [facility]  [address]  [place], [place] [address]  [phone] Fax [phone]    History of Present Illness:  75-year-old female with a history of coronary artery disease. She also has a pacemaker. She has been having severe dyspnea on exertion for the past several months. She states that walking as little as 50 feet can cause her to be severely dyspneic. She feels fine at rest. She also complains of diffuse muscle aches.  She's been having more and more dyspnea with exertion. We schedule her for her for a stress Myoview study. She was found to have a reversible apical defect. She has known mild to moderate coronary artery disease from a heart cath 6 years ago. Her ejection fraction was normal at 66%.  Because of her progressive symptoms and a fairly normal ejection fraction, we will schedule her for cardiac apposition.   Current Outpatient Prescriptions on File Prior to Visit   Medication  Sig  Dispense  Refill   .  ALPRAZolam (XANAX) 0.25 MG tablet  Take 0.25 mg by mouth at bedtime as needed.     .  aspirin 81 MG tablet  Take 81 mg by mouth daily.     .  benazepril (LOTENSIN) 10 MG tablet  Take 10 mg by mouth daily.     .  carvedilol (COREG) 3.125 MG tablet  take 1 tablet twice a day  60 tablet  5   .  escitalopram (LEXAPRO) 10 MG tablet  Take 10 mg by mouth daily.     .  esomeprazole (NEXIUM) 40 MG capsule  Take 40 mg by mouth daily before breakfast.     .  ezetimibe-simvastatin (VYTORIN) 10-20 MG per tablet  Take 1 tablet by mouth at bedtime.     .  fentaNYL (DURAGESIC) 100 MCG/HR  Place 1 patch onto the skin every 3 (three) days.     .  Fluticasone-Salmeterol (ADVAIR DISKUS IN)  Inhale into the lungs as directed.     .  glyBURIDE-metformin (GLUCOVANCE) 5-500 MG per tablet  Take 2 tablets by mouth 2 (two) times daily with a meal.     .  insulin aspart (NOVOLOG) 100 UNIT/ML injection  Inject into the skin as directed.      .  insulin glargine (LANTUS) 100 UNIT/ML injection  Inject 30 Units into the skin as directed.     .  iron polysaccharides (NIFEREX) 150 MG capsule  Take 150 mg by mouth daily.     .  oxyCODONE-acetaminophen (PERCOCET) 10-325 MG per tablet  Take 1 tablet by mouth every 4 (four) hours as needed.     .  torsemide (DEMADEX) 20 MG tablet  take 1 tablet by mouth once daily  30 tablet  5   .  warfarin (COUMADIN) 5 MG tablet  Take 5 mg by mouth daily.     .  zolpidem (AMBIEN) 10 MG tablet  Take 10 mg by mouth at bedtime as needed.      Allergies   Allergen  Reactions   .  Codeine    .  Demerol    .  Penicillins     Past Medical History   Diagnosis  Date   .  CHF (congestive heart failure)      chronic systolic CHF   .  Hypertension    .  Atrial fibrillation    .  Obesity    .  Coronary   artery disease      cardiac catheterization in 1997 -- Est. EF of 50%   .  Fatigue    .  Stress    .  Chest pain    .  Shortness of breath    .  Peptic ulcer  1985    Past Surgical History   Procedure  Date   .  Cardiac catheterization  02/21/96     Est. EF of 50%   .  Insert / replace / remove pacemaker  07/23/99     atrial insertion of the AV node -- Medtronic single chamber pacemaker   .  Vesicovaginal fistula closure w/ tah  1995   .  Tonsillectomy     History   Smoking status   .  Former Smoker   .  Quit date:  03/28/1989   Smokeless tobacco   .  Not on file    History   Alcohol Use  No    Family History   Problem  Relation  Age of Onset   .  Heart disease  Mother    .  Hypertension  Mother    Reviw of Systems:  Reviewed in the HPI. All other systems are negative.  Physical Exam:  BP 126/68  Pulse 65  Wt 235 lb 6.4 oz (106.777 kg)  SpO2 98%  The patient is alert and oriented x 3. The mood and affect are normal.  Skin: warm and dry. Color is pale.  HEENT: Her neck is supple. There is no JVD. Carotids are normal. Her mucous membranes are moist.  Lungs: Clear to patient  Heart:  Regular rate S1-S2. She is a soft systolic murmur  Abdomen: Mild obesity. She has good bowel sounds.  Extremities: No clubbing cyanosis or edema  Neuro: Nonfocal. Her gait is somewhat slow but is otherwise normal.  ECG:  Ventricular pacing  Assessment / Plan:  #1. Coronary artery disease: Joyce Terry presents with increasing shortness of breath with some chest discomfort. The Myoview study reveals the presence of an anterior defect. I cannot completely rule out a breast artifact but given her symptoms and a mild defect, I'm inclined to proceed with cardiac catheterization. We discussed the risks, benefits, and options. She understands and agrees to proceed.  She's currently on Coumadin. Her last INR was 2.7. We will have her hold her Coumadin today. We'll have her eat fresh spinach for the next several days and we'll recheck her INR and other labs on Monday. We'll schedule this with Dr. Jordan. If her INR still elevated, he may be able to use a radial approach.     

## 2011-02-16 ENCOUNTER — Other Ambulatory Visit: Payer: Self-pay

## 2011-02-16 ENCOUNTER — Ambulatory Visit: Payer: Medicare Other | Admitting: Cardiovascular Disease

## 2011-02-16 LAB — CBC
HCT: 29.2 % — ABNORMAL LOW (ref 36.0–46.0)
MCH: 19.5 pg — ABNORMAL LOW (ref 26.0–34.0)
MCHC: 28.8 g/dL — ABNORMAL LOW (ref 30.0–36.0)
MCV: 67.9 fL — ABNORMAL LOW (ref 78.0–100.0)
Platelets: 281 10*3/uL (ref 150–400)
RDW: 17.7 % — ABNORMAL HIGH (ref 11.5–15.5)
WBC: 7.5 10*3/uL (ref 4.0–10.5)

## 2011-02-16 LAB — POCT I-STAT GLUCOSE: Operator id: 141321

## 2011-02-16 LAB — BASIC METABOLIC PANEL
BUN: 19 mg/dL (ref 6–23)
Calcium: 8.8 mg/dL (ref 8.4–10.5)
Chloride: 105 mEq/L (ref 96–112)
Creatinine, Ser: 1.19 mg/dL — ABNORMAL HIGH (ref 0.50–1.10)
GFR calc Af Amer: 51 mL/min — ABNORMAL LOW (ref >90)
GFR calc non Af Amer: 44 mL/min — ABNORMAL LOW (ref >90)

## 2011-02-16 LAB — GLUCOSE, CAPILLARY
Glucose-Capillary: 60 mg/dL — ABNORMAL LOW (ref 70–99)
Glucose-Capillary: 77 mg/dL (ref 70–99)

## 2011-02-16 MED ORDER — CLOPIDOGREL BISULFATE 75 MG PO TABS: 75.0000 mg | ORAL_TABLET | Freq: Every day | ORAL | Status: DC

## 2011-02-16 MED ORDER — NITROGLYCERIN 0.4 MG SL SUBL: 0.4000 mg | SUBLINGUAL_TABLET | SUBLINGUAL | Status: DC | PRN

## 2011-02-16 MED FILL — Dextrose Inj 5%: INTRAVENOUS | Qty: 50 | Status: AC

## 2011-02-16 NOTE — Progress Notes (Signed)
CARDIAC REHAB PHASE I   PRE:  Rate/Rhythm: 65 Pacing  BP:  Supine:   Sitting: 104/59  Standing:    SaO2:   MODE:  Ambulation: 200 ft   POST:  Rate/Rhythem: 65 Pacing  BP:  Supine:   Sitting: 136/57  Standing:    SaO2:  5284-1324  Assisted X one and used cane to ambulate. She tired easily and c/o of back and leg pain, which she had prior to coming to hospital . She has DOE but states this is much improved since PCI. Pt took many standing rest stops in 200 ft walk. Seems very deconditioned.  Beatrix Fetters

## 2011-02-16 NOTE — Progress Notes (Signed)
HYPOGLYCEMIC EVENT  CBG: 60 @ 0710  TREATMENT: 15 GM carbohydrate snack  SYMPTOMS: None  FOLLOW-UP CBG:    Time:  0725 CBG Result:77  POSSIBLE REASONS FOR EVENT: Inadequate meal intake  COMMENTS/MD NOTIFIED:  Theodore Demark, PA notified.

## 2011-02-16 NOTE — Discharge Summary (Signed)
Patient ID: Joyce Terry,  MRN: [id number], DOB/AGE: 73-16-1937 75 y.o.  Admit date: 02/15/2011 Discharge date: 02/16/2011  Primary MD: Minda Meo MD Primary Cardiologist: Vesta Mixer MD  Discharge Diagnoses: Principal Problem:  *CAD (coronary artery disease) s/p DES to mid LAD Active Problems:  DM  ANEMIA-IRON DEFICIENCY  Atrial fibrillation on chronic coumadin  Allergies Allergies  Allergen Reactions  . Codeine Nausea And Vomiting  . Demerol Nausea And Vomiting  . Penicillins Hives    Procedures:  02/15/11 - Left Heart Catheterization w/ PCI  Coronary dominance: right   Left mainstem: Normal   LAD: Focal 90% lesion in mid vessel immediately after takeoff of moderately large diagonal branch.   Left circumflex (LCx): Mild disease 20% in the mid vessel.   Right coronary artery (RCA): Mild diffuse wall irregularities.   Left ventriculography: LV systolic function is normal, LVEF is estimated at 55%, no significant MR.    There is apical hypokinesis.   Final Conclusions:   1. Single vessel obstructive CAD involving the mid LAD.  Procedure Performed:   1. Successful PTCA/2.75 x 14 mm Resolute Integrity DES to mid LAD  Recommendations: Continue Plavix for at least one year. She will be restarted on coumadin tomorrow before   discharge. Given her need for chronic coumadin therapy with atrial fibrillation, would not recommend using ASA.    History of Present Illness: 75yof w/ h/o CAD, atrial fibrillation, DM, HTN, and a pacemaker who was having severe DOE for the past several months and had an abnormal stress Myoview that revealed a reversible apical defect who was admitted for planned heart catheterization.  Hospital Course: Patient was admitted on 02/15/11 for planned heart catheterization that was performed on day of admission. Findings are as above with a DES placed to the mid LAD. She tolerated the procedure well and had no chest pain at day of  discharge. She ambulated with cardiac rehab and had some mild dyspnea, but stated she felt much better than before admission. Plavix was added to her medication regimen and recommended to be continued for at least one year. Aspirin was discontinued due to need for continued coumadin therapy secondary to atrial fibrillation. She will follow up with Dr. Elease Hashimoto in several weeks.  She has chronic anemia and her discharge H&H was noted to be 8.4/29.2. She had no signs or symptoms of active bleeding. We recommended that she follow up with her PCP to discuss further evaluation/treatment.  Discharge Vitals:  Temp:  [95.5 F (35.3 C)-98.6 F (37 C)] 98 F (36.7 C) (11/21 0736) Pulse Rate:  [65-77] 65  (11/21 0736) Resp:  [13-17] 13  (11/21 0736) BP: (110-163)/(50-72) 163/56 mmHg (11/21 0736) SpO2:  [88 %-98 %] 94 % (11/21 0432) Weight:  [105.5 kg (232 lb 9.4 oz)] 232 lb 9.4 oz (105.5 kg) (11/21 0656)  Labs:  Lab Results  Component Value Date   WBC 7.5 02/16/2011   HGB 8.4* 02/16/2011   HCT 29.2* 02/16/2011   MCV 67.9* 02/16/2011   PLT 281 02/16/2011    Lab 02/16/11 0730  NA 143  K 3.7  CL 105  CO2 29  BUN 19  CREATININE 1.19*  CALCIUM 8.8  GLUCOSE 79    Basename 02/14/11 0844  LABPROT 14.1*  INR 1.3*   Follow-up Information    Follow up with ARONSON,RICHARD A, MD. Make an appointment in 1 week.   Contact information:   [address] [facility] [employer], [place]. [facility] [place] [address]  [phone]       Follow up with Elyn Aquas., MD on 03/15/2011. (10:45am)    Contact information:   [facility] [address] [place] [address] [phone]        Discharge Medications:  Current Discharge Medication List    START taking these medications   Details  clopidogrel (PLAVIX) 75 MG tablet Take 1 tablet (75 mg total) by mouth daily with breakfast. Qty: 30 tablet, Refills: 6    nitroGLYCERIN (NITROSTAT)  0.4 MG SL tablet Place 1 tablet (0.4 mg total) under the tongue every 5 (five) minutes as needed for chest pain. Qty: 25 tablet, Refills: 3      CONTINUE these medications which have NOT CHANGED   Details  ALPRAZolam (XANAX) 0.25 MG tablet Take 0.125 mg by mouth at bedtime as needed. For anxiety     benazepril (LOTENSIN) 10 MG tablet Take 10 mg by mouth daily.    Associated Diagnoses: Atrial fibrillation    carvedilol (COREG) 3.125 MG tablet take 1 tablet twice a day Qty: 60 tablet, Refills: 5    escitalopram (LEXAPRO) 10 MG tablet Take 10 mg by mouth daily.     Associated Diagnoses: Atrial fibrillation    esomeprazole (NEXIUM) 40 MG capsule Take 40 mg by mouth daily before breakfast.     Associated Diagnoses: Atrial fibrillation    ezetimibe-simvastatin (VYTORIN) 10-20 MG per tablet Take 1 tablet by mouth at bedtime.     Associated Diagnoses: Atrial fibrillation    fentaNYL (DURAGESIC) 100 MCG/HR Place 1 patch onto the skin every 3 (three) days.    Associated Diagnoses: Atrial fibrillation    glyBURIDE-metformin (GLUCOVANCE) 5-500 MG per tablet Take 2 tablets by mouth 2 (two) times daily with a meal.  ** Hold for 48 hours after cath. Restart 02/17/11**   Associated Diagnoses: Atrial fibrillation    insulin aspart (NOVOLOG) 100 UNIT/ML injection Inject 4-8 Units into the skin 2 (two) times daily. If blood sugar>250, use 7-8 units. If if <200, use 4-5 units     insulin glargine (LANTUS) 100 UNIT/ML injection Inject 30 Units into the skin as directed.    Associated Diagnoses: Atrial fibrillation    iron polysaccharides (NIFEREX) 150 MG capsule Take 150 mg by mouth daily.     Associated Diagnoses: Atrial fibrillation    oxyCODONE-acetaminophen (PERCOCET) 10-325 MG per tablet Take 1 tablet by mouth every 4 (four) hours as needed. For pain     torsemide (DEMADEX) 20 MG tablet take 1 tablet by mouth once daily Qty: 30 tablet, Refills: 5    warfarin (COUMADIN) 5 MG tablet Take  2.5-5 mg by mouth daily. Take 2.5 mg tues,and fri, take 5 mg all other days     zolpidem (AMBIEN) 10 MG tablet Take 5-10 mg by mouth at bedtime as needed. For sleep     Fluticasone-Salmeterol (ADVAIR) 250-50 MCG/DOSE AEPB Inhale 1 puff into the lungs every 12 (twelve) hours as needed. For shortness of breath     OVER THE COUNTER MEDICATION Place 1-2 drops into both eyes daily as needed. For dry/itchy eyes --saline drops       STOP taking these medications     aspirin 81 MG tablet         Outstanding Labs/Studies: Follow up H&H with PCP  Duration of Discharge Encounter: Greater than 30 minutes including physician time.  Signed, HOPE, JESSICA, PA-C 02/16/2011, 9:40 AM  Attending Note:   The patient was seen and examined.  Agree with assessment and plan as noted above.  See my note from earlier today.  Pt is stable and ready to go home. We will need to keep an close eye on her Hb.    Vesta Mixer, Montez [place]., MD, [facility] 02/16/2011, 5:20 PM

## 2011-02-16 NOTE — Progress Notes (Signed)
Shanda Bumps, PA was paged about pt not voiding after foley removal.  Bladder scanner was performed, 27 mL was found to be in her bladder.  PA told us to go ahead and send pt home.

## 2011-02-16 NOTE — Plan of Care (Signed)
Problem: Phase I Progression Outcomes Goal: Voiding-avoid urinary catheter unless indicated Outcome: Not Progressing Had to place foley catheter last night for urinary retention.  Removed this am, waiting for patient to void.

## 2011-02-16 NOTE — Progress Notes (Signed)
Pt disconnected from tele and IV.  VSS.  Currently waiting for pt to void post foley cath removal. Pt discharge education will be given.  Family will take pt home.

## 2011-02-16 NOTE — Plan of Care (Signed)
Problem: Phase I Progression Outcomes Goal: Vascular site scale level 0 - I Vascular Site Scale Level 0: No bruising/bleeding/hematoma Level I (Mild): Bruising/Ecchymosis, minimal bleeding/ooozing, palpable hematoma < 3 cm Level II (Moderate): Bleeding not affecting hemodynamic parameters, pseudoaneurysm, palpable hematoma > 3 cm Outcome: Completed/Met Date Met:  02/16/11 Level 0

## 2011-02-16 NOTE — Plan of Care (Signed)
Problem: Phase I Progression Outcomes Goal: Voiding-avoid urinary catheter unless indicated Outcome: Adequate for Discharge See progress note.

## 2011-02-16 NOTE — Progress Notes (Signed)
Subjective:    Joyce Terry is S/P stent to her mid LAD.  No complications.  Breathing well. Will be able to go home after ambulation.      Marland Kitchen aspirin  324 mg Oral Pre-Cath  . benazepril  10 mg Oral Daily  . bivalirudin      . carvedilol  3.125 mg Oral BID WC  . clopidogrel  75 mg Oral Q breakfast  . diazepam  5 mg Oral On Call  . diphenhydrAMINE  25 mg Oral On Call  . escitalopram  10 mg Oral Daily  . ezetimibe-simvastatin  1 tablet Oral QHS  . famotidine (PEPCID) IV  20 mg Intravenous Once  . fentaNYL  100 mcg Transdermal Q72H  . fentaNYL      . heparin      . insulin glargine  30 Units Subcutaneous QHS  . lidocaine      . midazolam      . morphine      . nitroGLYCERIN      . torsemide  20 mg Oral Once  . DISCONTD: sodium chloride  3 mL Intravenous Q12H      . sodium chloride 1,000 mL (02/15/11 1649)  . sodium chloride    . sodium chloride      Objective:  Vital Signs in the last 24 hours: Blood pressure 142/53, pulse 76, temperature 98.4 F (36.9 C), temperature source Oral, resp. rate 13, weight 232 lb 9.4 oz (105.5 kg), SpO2 94.00%. Temp:  [95.5 F (35.3 C)-98.6 F (37 C)] 98.4 F (36.9 C) (11/21 0432) Pulse Rate:  [64-77] 76  (11/21 0432) Resp:  [13-17] 13  (11/21 0432) BP: (110-149)/(50-72) 142/53 mmHg (11/21 0432) SpO2:  [88 %-98 %] 94 % (11/21 0432) Weight:  [232 lb 9.4 oz (105.5 kg)] 232 lb 9.4 oz (105.5 kg) (11/21 0656)  Intake/Output from previous day: 11/20 0701 - 11/21 0700 In: 1063.8 [P.O.:750; I.V.:313.8] Out: 2325 [Urine:2325] Intake/Output from this shift:    Physical Exam: The patient is alert and oriented x 3.  The mood and affect are normal.   Skin: warm and dry.  Color is normal.    HEENT:   the sclera are nonicteric.  The mucous membranes are moist.  The carotids are 2+ without bruits.  There is no thyromegaly.  There is no JVD.    Lungs: clear.  The chest wall is non tender.    Heart: regular rate with a normal S1 and S2.   There are no murmurs, gallops, or rubs. The PMI is not displaced.     Abdomen: good bowel sounds.  There is no guarding or rebound.  There is no hepatosplenomegaly or tenderness.  There are no masses.   Extremities:  no clubbing, cyanosis, or edema.  The legs are without rashes.  The distal pulses are intact.   Neuro:  Cranial nerves II - XII are intact.  Motor and sensory functions are intact.     Lab Results:  [facility] 02/14/11 0844  WBC 9.1  HGB 8.8 Repeated and verified X2.*  PLT 375.0    Basename 02/14/11 0844  NA 139  K 4.2  CL 105  CO2 25  GLUCOSE 214*  BUN 21  CREATININE 1.3*   No results found for this basename: TROPONINI:2,CK,MB:2 in the last 72 hours No results found for this basename: BNP in the last 72 hours Hepatic Function Panel No results found for this basename: PROT,ALBUMIN,AST,ALT,ALKPHOS,BILITOT,BILIDIR,IBILI in the last 72 hours No results found for  this basename: CHOL, HDL, LDLCALC, LDLDIRECT, TRIG, CHOLHDL    Basename 02/14/11 0844  INR 1.3*    Assessment/Plan:    1.  CAD- s/p pci to mid LAD.   Will add Plavix 75 to medications Continue coumadin  ( and other meds except ASA) D/C aspirin.  2.  Anemia.  She will need to follow up with her primary md.  I'll see her in several weeks.     Vesta Mixer, Montez [place]., MD, [facility] 02/16/2011, 7:03 AM

## 2011-02-21 ENCOUNTER — Encounter (HOSPITAL_COMMUNITY): Payer: Medicare Other

## 2011-02-24 ENCOUNTER — Encounter: Payer: Self-pay | Admitting: Internal Medicine

## 2011-02-24 ENCOUNTER — Ambulatory Visit (INDEPENDENT_AMBULATORY_CARE_PROVIDER_SITE_OTHER): Payer: Medicare Other | Admitting: Internal Medicine

## 2011-02-24 ENCOUNTER — Encounter: Payer: Self-pay | Admitting: *Deleted

## 2011-02-24 VITALS — BP 134/60 | HR 65 | Ht 68.5 in | Wt 232.1 lb

## 2011-02-24 DIAGNOSIS — I4891 Unspecified atrial fibrillation: Secondary | ICD-10-CM

## 2011-02-24 DIAGNOSIS — Z950 Presence of cardiac pacemaker: Secondary | ICD-10-CM

## 2011-02-24 DIAGNOSIS — I495 Sick sinus syndrome: Secondary | ICD-10-CM | POA: Insufficient documentation

## 2011-02-24 DIAGNOSIS — I2510 Atherosclerotic heart disease of native coronary artery without angina pectoris: Secondary | ICD-10-CM

## 2011-02-24 LAB — PACEMAKER DEVICE OBSERVATION: RV LEAD IMPEDENCE PM: 582 Ohm

## 2011-02-24 NOTE — Assessment & Plan Note (Signed)
Her device is at elective replacement. I discussed the risks, benefits, goals, and expectations for pacemaker generator change with the patient and she wishes to proceed. This will be scheduled in the next few weeks. We'll plan to hold her anticoagulations with Coumadin for 5 days prior to the procedure. Because she has had a recent coronary stent, she will continue taking Plavix.

## 2011-02-24 NOTE — Patient Instructions (Signed)
Your physician has recommended that you have a pacemaker generator change  

## 2011-02-24 NOTE — Progress Notes (Signed)
HPI Joyce Terry returns today for followup. She is a pleasant 75 year old woman with a history of symptomatic bradycardia status post permanent pacemaker insertion. The patient also has atrial fibrillation and has been on warfarin therapy. She was in her usual state of health until about one week ago when she experienced shortness of breath no such we've found on catheterization to have a very tight LAD stenosis and underwent stenting of her LAD at that time. Since then her symptoms of dyspnea have improved markedly. She denies chest pain. She denies peripheral edema. She has had no syncope. Allergies  Allergen Reactions  . Codeine Nausea And Vomiting  . Demerol Nausea And Vomiting  . Penicillins Hives     Current Outpatient Prescriptions  Medication Sig Dispense Refill  . ALPRAZolam (XANAX) 0.25 MG tablet Take 0.125 mg by mouth at bedtime as needed. For anxiety       . benazepril (LOTENSIN) 10 MG tablet Take 10 mg by mouth daily.       . carvedilol (COREG) 3.125 MG tablet take 1 tablet twice a day  60 tablet  5  . clopidogrel (PLAVIX) 75 MG tablet Take 1 tablet (75 mg total) by mouth daily with breakfast.  30 tablet  6  . escitalopram (LEXAPRO) 10 MG tablet Take 10 mg by mouth daily.        Marland Kitchen esomeprazole (NEXIUM) 40 MG capsule Take 40 mg by mouth daily before breakfast.        . ezetimibe-simvastatin (VYTORIN) 10-20 MG per tablet Take 1 tablet by mouth at bedtime.        . fentaNYL (DURAGESIC) 100 MCG/HR Place 1 patch onto the skin every 3 (three) days.       . Fluticasone-Salmeterol (ADVAIR) 250-50 MCG/DOSE AEPB Inhale 1 puff into the lungs every 12 (twelve) hours as needed. For shortness of breath       . glyBURIDE-metformin (GLUCOVANCE) 5-500 MG per tablet Take 2 tablets by mouth 2 (two) times daily with a meal.       . insulin aspart (NOVOLOG) 100 UNIT/ML injection Inject 4-8 Units into the skin 2 (two) times daily. If blood sugar>250, use 7-8 units. If if <200, use 4-5 units       .  insulin glargine (LANTUS) 100 UNIT/ML injection Inject 30 Units into the skin as directed.       . iron polysaccharides (NIFEREX) 150 MG capsule Take 150 mg by mouth daily.        . nitroGLYCERIN (NITROSTAT) 0.4 MG SL tablet Place 1 tablet (0.4 mg total) under the tongue every 5 (five) minutes as needed for chest pain.  25 tablet  3  . OVER THE COUNTER MEDICATION Place 1-2 drops into both eyes daily as needed. For dry/itchy eyes --saline drops       . oxyCODONE-acetaminophen (PERCOCET) 10-325 MG per tablet Take 1 tablet by mouth every 4 (four) hours as needed. For pain       . torsemide (DEMADEX) 20 MG tablet take 1 tablet by mouth once daily  30 tablet  5  . warfarin (COUMADIN) 5 MG tablet Take 2.5-5 mg by mouth daily. Take 2.5 mg tues,and fri, take 5 mg all other days       . zolpidem (AMBIEN) 10 MG tablet Take 5-10 mg by mouth at bedtime as needed. For sleep          Past Medical History  Diagnosis Date  . CHF (congestive heart failure)     chronic systolic  CHF  . Hypertension   . Atrial fibrillation   . Obesity   . Coronary artery disease     cardiac catheterization in 1997 -- Est. EF of 50%  . Fatigue   . Stress   . Chest pain   . Shortness of breath   . Peptic ulcer 1985  . Angina   . Anemia   . Cancer     ENDOMETRIAL  . GERD (gastroesophageal reflux disease)   . Depression   . Sinoatrial node dysfunction     ROS:   All systems reviewed and negative except as noted in the HPI.   Past Surgical History  Procedure Date  . Cardiac catheterization 02/21/96    Est. EF of 50%  . Vesicovaginal fistula closure w/ tah 1995  . Abdominal hysterectomy   . Tonsillectomy   . Joint replacement     LEFT HIP   02/2008  . Coronary angioplasty with stent placement 02/15/11    "one"  . Cholecystectomy fall 2001  . Insert / replace / remove pacemaker 07/23/99    atrial insertion of the AV node -- Medtronic single chamber pacemaker   . Dilation and curettage of uterus     "had  some when I was 18-22"     Family History  Problem Relation Age of Onset  . Heart disease Mother   . Hypertension Mother      History   Social History  . Marital Status: Divorced    Spouse Name: N/A    Number of Children: N/A  . Years of Education: N/A   Occupational History  . Not on file.   Social History Main Topics  . Smoking status: Former Smoker -- 1.0 packs/day for 35 years    Types: Cigarettes  . Smokeless tobacco: Never Used   Comment: quit smoking "11/1989"  . Alcohol Use: Yes     "glass of wine q once in awhile"  . Drug Use: No  . Sexually Active: No   Other Topics Concern  . Not on file   Social History Narrative  . No narrative on file     BP 134/60  Pulse 65  Ht 5' 8.5" (1.74 m)  Wt 105.289 kg (232 lb 1.9 oz)  BMI 34.78 kg/m2  Physical Exam:  Well appearing 75 year old woman,NAD HEENT: Unremarkable Neck:  No JVD, no thyromegally Lymphatics:  No adenopathy Back:  No CVA tenderness Lungs:  Clear with no wheezes, rales, or rhonchi. Well-healed pacemaker incision. HEART:  IRegular rate rhythm, no murmurs, no rubs, no clicks Abd:  soft, positive bowel sounds, no organomegally, no rebound, no guarding Ext:  2 plus pulses, no edema, no cyanosis, no clubbing Skin:  No rashes no nodules Neuro:  CN II through XII intact, motor grossly intact  DEVICE  Normal device function.  See PaceArt for details. She is at [facility].  Assess/Plan:

## 2011-02-24 NOTE — Assessment & Plan Note (Signed)
She denies anginal symptoms. Her dyspnea is much improved after LAD stenting.

## 2011-02-24 NOTE — Assessment & Plan Note (Addendum)
The patient's palpitations are well-controlled. She will continue her current medical therapy.

## 2011-02-25 ENCOUNTER — Encounter (HOSPITAL_COMMUNITY): Payer: Self-pay | Admitting: Pharmacy Technician

## 2011-02-25 LAB — CBC WITH DIFFERENTIAL/PLATELET
Basophils Relative: 0.1 % (ref 0.0–3.0)
HCT: 30.6 % — ABNORMAL LOW (ref 36.0–46.0)
Hemoglobin: 9.4 g/dL — ABNORMAL LOW (ref 12.0–15.0)
Lymphocytes Relative: 17.4 % (ref 12.0–46.0)
Lymphs Abs: 1.4 10*3/uL (ref 0.7–4.0)
MCHC: 30.8 g/dL (ref 30.0–36.0)
Monocytes Relative: 6.3 % (ref 3.0–12.0)
Neutro Abs: 5.8 10*3/uL (ref 1.4–7.7)
RBC: 4.68 Mil/uL (ref 3.87–5.11)

## 2011-02-25 LAB — BASIC METABOLIC PANEL
CO2: 27 mEq/L (ref 19–32)
Calcium: 9 mg/dL (ref 8.4–10.5)
GFR: 48.58 mL/min — ABNORMAL LOW (ref >60.00)
Potassium: 4.7 mEq/L (ref 3.5–5.1)
Sodium: 140 mEq/L (ref 135–145)

## 2011-02-28 ENCOUNTER — Other Ambulatory Visit: Payer: Self-pay | Admitting: *Deleted

## 2011-02-28 ENCOUNTER — Telehealth: Payer: Self-pay | Admitting: Internal Medicine

## 2011-02-28 ENCOUNTER — Telehealth: Payer: Self-pay | Admitting: Cardiovascular Disease

## 2011-02-28 DIAGNOSIS — I498 Other specified cardiac arrhythmias: Secondary | ICD-10-CM

## 2011-02-28 NOTE — Telephone Encounter (Signed)
Pt calling re questions on med before her procedure

## 2011-02-28 NOTE — Telephone Encounter (Signed)
Pt aware labs were drawn 11/29.  No further blood work needed at this time.

## 2011-02-28 NOTE — Telephone Encounter (Signed)
Pt needs to come up to have lab work done prior to cath and if she needs to what dose she need and when does she need to come

## 2011-02-28 NOTE — Telephone Encounter (Signed)
Spoke with pt, told to hold metformin the am of the procedure. She will take 1/2 the evening dose of insulin the night before [employer]

## 2011-03-01 MED ORDER — VANCOMYCIN HCL 1000 MG IV SOLR
1500.0000 mg | INTRAVENOUS | Status: AC
  Filled 2011-03-01 (×2): qty 1500

## 2011-03-01 MED ORDER — GENTAMICIN SULFATE 40 MG/ML IJ SOLN
80.0000 mg | INTRAMUSCULAR | Status: AC
  Filled 2011-03-01 (×2): qty 2

## 2011-03-02 ENCOUNTER — Ambulatory Visit (HOSPITAL_COMMUNITY)
Admission: RE | Admit: 2011-03-02 | Discharge: 2011-03-02 | Disposition: A | Discharge status: Home / Self Care | Payer: Medicare Other | Source: Ambulatory Visit | Attending: Internal Medicine | Admitting: Internal Medicine

## 2011-03-02 ENCOUNTER — Ambulatory Visit (HOSPITAL_COMMUNITY): Payer: Medicare Other

## 2011-03-02 ENCOUNTER — Encounter (HOSPITAL_COMMUNITY)
Admission: RE | Disposition: A | Discharge status: Home / Self Care | Payer: Self-pay | Source: Ambulatory Visit | Attending: Internal Medicine

## 2011-03-02 DIAGNOSIS — Z01818 Encounter for other preprocedural examination: Secondary | ICD-10-CM | POA: Insufficient documentation

## 2011-03-02 DIAGNOSIS — I2510 Atherosclerotic heart disease of native coronary artery without angina pectoris: Secondary | ICD-10-CM | POA: Insufficient documentation

## 2011-03-02 DIAGNOSIS — I498 Other specified cardiac arrhythmias: Secondary | ICD-10-CM

## 2011-03-02 DIAGNOSIS — Z9861 Coronary angioplasty status: Secondary | ICD-10-CM | POA: Insufficient documentation

## 2011-03-02 DIAGNOSIS — I442 Atrioventricular block, complete: Secondary | ICD-10-CM

## 2011-03-02 DIAGNOSIS — Z45018 Encounter for adjustment and management of other part of cardiac pacemaker: Secondary | ICD-10-CM | POA: Insufficient documentation

## 2011-03-02 DIAGNOSIS — I4891 Unspecified atrial fibrillation: Secondary | ICD-10-CM | POA: Insufficient documentation

## 2011-03-02 HISTORY — PX: PACEMAKER GENERATOR CHANGE: SHX5481

## 2011-03-02 LAB — SURGICAL PCR SCREEN
MRSA, PCR: NEGATIVE
Staphylococcus aureus: NEGATIVE

## 2011-03-02 LAB — GLUCOSE, CAPILLARY: Glucose-Capillary: 206 mg/dL — ABNORMAL HIGH (ref 70–99)

## 2011-03-02 SURGERY — PACEMAKER GENERATOR CHANGE
Anesthesia: LOCAL

## 2011-03-02 MED ORDER — ONDANSETRON HCL 4 MG/2ML IJ SOLN: 4.0000 mg | Freq: Four times a day (QID) | INTRAMUSCULAR | Status: DC | PRN

## 2011-03-02 MED ORDER — MUPIROCIN 2 % EX OINT
TOPICAL_OINTMENT | Freq: Two times a day (BID) | CUTANEOUS | Status: DC
  Administered 2011-03-02: 11:00:00 via NASAL
  Filled 2011-03-02: qty 22

## 2011-03-02 MED ORDER — SODIUM CHLORIDE 0.9 % IV SOLN
INTRAVENOUS | Status: DC
  Administered 2011-03-02: 11:00:00 via INTRAVENOUS

## 2011-03-02 MED ORDER — MUPIROCIN 2 % EX OINT
TOPICAL_OINTMENT | CUTANEOUS | Status: AC
  Filled 2011-03-02: qty 22

## 2011-03-02 MED ORDER — LIDOCAINE HCL (PF) 1 % IJ SOLN
INTRAMUSCULAR | Status: AC
  Filled 2011-03-02: qty 60

## 2011-03-02 MED ORDER — ACETAMINOPHEN 500 MG PO TABS
1000.0000 mg | ORAL_TABLET | Freq: Four times a day (QID) | ORAL | Status: DC
  Filled 2011-03-02 (×4): qty 2

## 2011-03-02 MED ORDER — FENTANYL CITRATE 0.05 MG/ML IJ SOLN
INTRAMUSCULAR | Status: AC
  Filled 2011-03-02: qty 2

## 2011-03-02 MED ORDER — SODIUM CHLORIDE 0.9 % IR SOLN
Status: DC
  Filled 2011-03-02: qty 2

## 2011-03-02 MED ORDER — CHLORHEXIDINE GLUCONATE 4 % EX LIQD
60.0000 mL | Freq: Once | CUTANEOUS | Status: DC
  Filled 2011-03-02: qty 60

## 2011-03-02 MED ORDER — SODIUM CHLORIDE 0.9 % IV SOLN: 250.0000 mL | INTRAVENOUS | Status: AC

## 2011-03-02 MED ORDER — MIDAZOLAM HCL 5 MG/5ML IJ SOLN
INTRAMUSCULAR | Status: AC
  Filled 2011-03-02: qty 5

## 2011-03-02 MED ORDER — ACETAMINOPHEN 325 MG PO TABS
325.0000 mg | ORAL_TABLET | ORAL | Status: DC | PRN
  Filled 2011-03-02: qty 2

## 2011-03-02 NOTE — Op Note (Signed)
Joyce Terry, JASINSKI               ACCOUNT NO.:  [id number]  MEDICAL RECORD NO.:  [id number]  LOCATION:  [facility]                         FACILITY:  [facility]  PHYSICIAN:  Doylene Canning. Ladona Ridgel, MD    DATE OF BIRTH:  1935/10/11  DATE OF PROCEDURE:  03/02/2011 DATE OF DISCHARGE:  03/02/2011                              OPERATIVE REPORT   ELECTROPHYSIOLOGIC PROCEDURE NOTE  PROCEDURE PERFORMED:  Removal of previously implanted single-chamber pacemaker, which had reached elective replacement and insertion of a new single-chamber pacemaker.  INDICATION:  Symptomatic bradycardia with complete heart block and chronic atrial fibrillation.  INTRODUCTION:  The patient is a 75 year old woman who has a history of tachy-palpitations and atrial fibrillation with a very rapid ventricular response.  She underwent permanent pacemaker insertion approximately 12 years ago followed by AV node ablation.  The patient has reached elective replacement indication on her pacemaker and is now referred for pacemaker removal and insertion of a new device.  PROCEDURE:  After informed consent was obtained, the patient was taken to the diagnostic EP lab in a fasting state.  After usual preparation and draping, intravenous fentanyl and midazolam were given for sedation. 30 mL of lidocaine was infiltrated into the left infraclavicular region. A 5-cm incision was carried out over this region.  Electrocautery was utilized to dissect down to the fascia plane.  The pacemaker was entered without difficulty and the generator was removed.  The lead was disconnected from the can and hooked up to the alligator clamps where the pacing threshold was 0.2 V at 0.5 msec.  The patient's pacing impedance was 460 ohms.  With these satisfactory parameters, the new Medtronic Sensia single-chamber pacemaker serial number [id number] was connected to the ventricular pacing lead and placed back in the subcutaneous pocket.  The pocket was irrigated  with antibiotic irrigation.  The incision was closed with 2-0 and 3-0 Vicryl.  Benzoin, Steri-Strips painted on the skin, pressure dressing applied, and the patient was returned to her room in satisfactory condition.  COMPLICATIONS:  There were no immediate procedure complications.  RESULTS:  This demonstrates successful implantation of a Medtronic single-chamber pacemaker in a patient with symptomatic bradycardia and complete heart block.     Doylene Canning. Ladona Ridgel, MD     GWT/MEDQ  D:  03/02/2011  T:  03/02/2011  Job:  [id number]

## 2011-03-02 NOTE — Op Note (Signed)
PPM generator removed followed by the insertion of a new dual chamber PPM without immediate complication. [id number]

## 2011-03-02 NOTE — Interval H&P Note (Signed)
History and Physical Interval Note:  03/02/2011 11:10 AM  Joyce Terry  has presented today for surgery, with the diagnosis of End of life.  The various methods of treatment have been discussed with the patient and family. After consideration of risks, benefits and other options for treatment, the patient has consented to  Procedure(s): PACEMAKER GENERATOR CHANGE as a surgical intervention .  The patients' history has been reviewed, patient examined, no change in status, stable for surgery.  I have reviewed the patients' chart and labs.  Questions were answered to the patient's satisfaction.     Lewayne Bunting

## 2011-03-02 NOTE — H&P (View-Only) (Signed)
HPI Joyce Terry returns today for followup. She is a pleasant 75-year-old woman with a history of symptomatic bradycardia status post permanent pacemaker insertion. The patient also has atrial fibrillation and has been on warfarin therapy. She was in her usual state of health until about one week ago when she experienced shortness of breath no such we've found on catheterization to have a very tight LAD stenosis and underwent stenting of her LAD at that time. Since then her symptoms of dyspnea have improved markedly. She denies chest pain. She denies peripheral edema. She has had no syncope. Allergies  Allergen Reactions  . Codeine Nausea And Vomiting  . Demerol Nausea And Vomiting  . Penicillins Hives     Current Outpatient Prescriptions  Medication Sig Dispense Refill  . ALPRAZolam (XANAX) 0.25 MG tablet Take 0.125 mg by mouth at bedtime as needed. For anxiety       . benazepril (LOTENSIN) 10 MG tablet Take 10 mg by mouth daily.       . carvedilol (COREG) 3.125 MG tablet take 1 tablet twice a day  60 tablet  5  . clopidogrel (PLAVIX) 75 MG tablet Take 1 tablet (75 mg total) by mouth daily with breakfast.  30 tablet  6  . escitalopram (LEXAPRO) 10 MG tablet Take 10 mg by mouth daily.        . esomeprazole (NEXIUM) 40 MG capsule Take 40 mg by mouth daily before breakfast.        . ezetimibe-simvastatin (VYTORIN) 10-20 MG per tablet Take 1 tablet by mouth at bedtime.        . fentaNYL (DURAGESIC) 100 MCG/HR Place 1 patch onto the skin every 3 (three) days.       . Fluticasone-Salmeterol (ADVAIR) 250-50 MCG/DOSE AEPB Inhale 1 puff into the lungs every 12 (twelve) hours as needed. For shortness of breath       . glyBURIDE-metformin (GLUCOVANCE) 5-500 MG per tablet Take 2 tablets by mouth 2 (two) times daily with a meal.       . insulin aspart (NOVOLOG) 100 UNIT/ML injection Inject 4-8 Units into the skin 2 (two) times daily. If blood sugar>250, use 7-8 units. If if <200, use 4-5 units       .  insulin glargine (LANTUS) 100 UNIT/ML injection Inject 30 Units into the skin as directed.       . iron polysaccharides (NIFEREX) 150 MG capsule Take 150 mg by mouth daily.        . nitroGLYCERIN (NITROSTAT) 0.4 MG SL tablet Place 1 tablet (0.4 mg total) under the tongue every 5 (five) minutes as needed for chest pain.  25 tablet  3  . OVER THE COUNTER MEDICATION Place 1-2 drops into both eyes daily as needed. For dry/itchy eyes --saline drops       . oxyCODONE-acetaminophen (PERCOCET) 10-325 MG per tablet Take 1 tablet by mouth every 4 (four) hours as needed. For pain       . torsemide (DEMADEX) 20 MG tablet take 1 tablet by mouth once daily  30 tablet  5  . warfarin (COUMADIN) 5 MG tablet Take 2.5-5 mg by mouth daily. Take 2.5 mg tues,and fri, take 5 mg all other days       . zolpidem (AMBIEN) 10 MG tablet Take 5-10 mg by mouth at bedtime as needed. For sleep          Past Medical History  Diagnosis Date  . CHF (congestive heart failure)     chronic systolic   CHF  . Hypertension   . Atrial fibrillation   . Obesity   . Coronary artery disease     cardiac catheterization in 1997 -- Est. EF of 50%  . Fatigue   . Stress   . Chest pain   . Shortness of breath   . Peptic ulcer 1985  . Angina   . Anemia   . Cancer     ENDOMETRIAL  . GERD (gastroesophageal reflux disease)   . Depression   . Sinoatrial node dysfunction     ROS:   All systems reviewed and negative except as noted in the HPI.   Past Surgical History  Procedure Date  . Cardiac catheterization 02/21/96    Est. EF of 50%  . Vesicovaginal fistula closure w/ tah 1995  . Abdominal hysterectomy   . Tonsillectomy   . Joint replacement     LEFT HIP   02/2008  . Coronary angioplasty with stent placement 02/15/11    "one"  . Cholecystectomy fall 2001  . Insert / replace / remove pacemaker 07/23/99    atrial insertion of the AV node -- Medtronic single chamber pacemaker   . Dilation and curettage of uterus     "had  some when I was 18-22"     Family History  Problem Relation Age of Onset  . Heart disease Mother   . Hypertension Mother      History   Social History  . Marital Status: Divorced    Spouse Name: N/A    Number of Children: N/A  . Years of Education: N/A   Occupational History  . Not on file.   Social History Main Topics  . Smoking status: Former Smoker -- 1.0 packs/day for 35 years    Types: Cigarettes  . Smokeless tobacco: Never Used   Comment: quit smoking "11/1989"  . Alcohol Use: Yes     "glass of wine q once in awhile"  . Drug Use: No  . Sexually Active: No   Other Topics Concern  . Not on file   Social History Narrative  . No narrative on file     BP 134/60  Pulse 65  Ht 5' 8.5" (1.74 m)  Wt 105.289 kg (232 lb 1.9 oz)  BMI 34.78 kg/m2  Physical Exam:  Well appearing 75-year-old woman,NAD HEENT: Unremarkable Neck:  No JVD, no thyromegally Lymphatics:  No adenopathy Back:  No CVA tenderness Lungs:  Clear with no wheezes, rales, or rhonchi. Well-healed pacemaker incision. HEART:  IRegular rate rhythm, no murmurs, no rubs, no clicks Abd:  soft, positive bowel sounds, no organomegally, no rebound, no guarding Ext:  2 plus pulses, no edema, no cyanosis, no clubbing Skin:  No rashes no nodules Neuro:  CN II through XII intact, motor grossly intact  DEVICE  Normal device function.  See PaceArt for details. She is at [facility].  Assess/Plan:   

## 2011-03-03 ENCOUNTER — Telehealth: Payer: Self-pay | Admitting: Internal Medicine

## 2011-03-03 NOTE — Telephone Encounter (Signed)
New msg Pt was released from hospital yesterday. She has a few questions. Please call her back.

## 2011-03-03 NOTE — Telephone Encounter (Signed)
Will schedule for a 10 day wound check with device clinic and 3 months with Dr Ladona Ridgel

## 2011-03-14 ENCOUNTER — Encounter: Payer: Self-pay | Admitting: Internal Medicine

## 2011-03-14 ENCOUNTER — Ambulatory Visit (INDEPENDENT_AMBULATORY_CARE_PROVIDER_SITE_OTHER): Payer: Medicare Other | Admitting: *Deleted

## 2011-03-14 DIAGNOSIS — I495 Sick sinus syndrome: Secondary | ICD-10-CM

## 2011-03-14 LAB — PACEMAKER DEVICE OBSERVATION
BATTERY VOLTAGE: 2.79 V
BMOD-0005RV: 95 {beats}/min
BRDY-0002RV: 70 {beats}/min
RV LEAD IMPEDENCE PM: 573 Ohm
VENTRICULAR PACING PM: 100

## 2011-03-14 NOTE — Progress Notes (Signed)
Wound check-PPM 

## 2011-03-15 ENCOUNTER — Ambulatory Visit: Payer: Medicare Other | Admitting: Cardiovascular Disease

## 2011-03-15 ENCOUNTER — Encounter: Payer: Self-pay | Admitting: Cardiovascular Disease

## 2011-03-15 ENCOUNTER — Ambulatory Visit (INDEPENDENT_AMBULATORY_CARE_PROVIDER_SITE_OTHER): Payer: Medicare Other | Admitting: Cardiovascular Disease

## 2011-03-15 DIAGNOSIS — I2510 Atherosclerotic heart disease of native coronary artery without angina pectoris: Secondary | ICD-10-CM

## 2011-03-15 DIAGNOSIS — D649 Anemia, unspecified: Secondary | ICD-10-CM

## 2011-03-15 DIAGNOSIS — E785 Hyperlipidemia, unspecified: Secondary | ICD-10-CM

## 2011-03-15 LAB — CBC WITH DIFFERENTIAL/PLATELET
Eosinophils Relative: 1.3 % (ref 0.0–5.0)
HCT: 28.5 % — ABNORMAL LOW (ref 36.0–46.0)
Hemoglobin: 8.7 g/dL — ABNORMAL LOW (ref 12.0–15.0)
Lymphs Abs: 0.9 10*3/uL (ref 0.7–4.0)
Monocytes Relative: 7.4 % (ref 3.0–12.0)
Platelets: 315 10*3/uL (ref 150.0–400.0)
WBC: 7.7 10*3/uL (ref 4.5–10.5)

## 2011-03-15 NOTE — Progress Notes (Signed)
[name] Date of Birth  [birth year] [facility] [address]    [address] [place], [place]  [address] [phone]  Fax  [phone]  History of Present Illness:  Joyce Terry is a 75 year old female with a history of coronary artery disease and atrial fibrillation. She is status post recent PTCA and stenting of her mid LAD. She also has a history of a GI bleed with a recent hemoglobin of 8.4. She had her pacer generator changed 2 weeks after her angioplasty/stent.  She has not noticed any dark or tarry stools.  Current Outpatient Prescriptions on File Prior to Visit  Medication Sig Dispense Refill  . ALPRAZolam (XANAX) 0.25 MG tablet Take 0.125 mg by mouth at bedtime as needed. For anxiety       . benazepril (LOTENSIN) 10 MG tablet Take 10 mg by mouth daily.       . carvedilol (COREG) 3.125 MG tablet take 1 tablet twice a day  60 tablet  5  . clopidogrel (PLAVIX) 75 MG tablet Take 1 tablet (75 mg total) by mouth daily with breakfast.  30 tablet  6  . escitalopram (LEXAPRO) 10 MG tablet Take 10 mg by mouth daily.        Marland Kitchen esomeprazole (NEXIUM) 40 MG capsule Take 40 mg by mouth daily before breakfast.        . ezetimibe-simvastatin (VYTORIN) 10-20 MG per tablet Take 1 tablet by mouth at bedtime.       . fentaNYL (DURAGESIC) 100 MCG/HR Place 1 patch onto the skin every 3 (three) days.       . Fluticasone-Salmeterol (ADVAIR) 250-50 MCG/DOSE AEPB Inhale 1 puff into the lungs every 12 (twelve) hours as needed. For shortness of breath       . glyBURIDE-metformin (GLUCOVANCE) 5-500 MG per tablet Take 2 tablets by mouth 2 (two) times daily with a meal.       . insulin aspart (NOVOLOG) 100 UNIT/ML injection Inject 4-8 Units into the skin 2 (two) times daily. If blood sugar>250, use 7-8 units. If if <200, use 4-5 units       . insulin glargine (LANTUS) 100 UNIT/ML injection Inject 30 Units into the skin at bedtime.       . iron polysaccharides (NIFEREX) 150 MG capsule Take 150 mg by  mouth 2 (two) times daily.       . nitroGLYCERIN (NITROSTAT) 0.4 MG SL tablet Place 1 tablet (0.4 mg total) under the tongue every 5 (five) minutes as needed for chest pain.  25 tablet  3  . OVER THE COUNTER MEDICATION Place 1-2 drops into both eyes daily as needed. For dry/itchy eyes --saline drops       . oxyCODONE-acetaminophen (PERCOCET) 10-325 MG per tablet Take 1 tablet by mouth every 4 (four) hours as needed. For pain       . torsemide (DEMADEX) 20 MG tablet take 1 tablet by mouth once daily  30 tablet  5  . warfarin (COUMADIN) 5 MG tablet Take 2.5-5 mg by mouth daily. Take 2.5 mg tues,and fri, take 5 mg all other days       . zolpidem (AMBIEN) 10 MG tablet Take 5 mg by mouth at bedtime as needed. For sleep        Allergies  Allergen Reactions  . Codeine Nausea And Vomiting  . Demerol Nausea And Vomiting  . Morphine And Related Itching  . Penicillins Hives  . Tape     Past Medical History  Diagnosis Date  . CHF (congestive heart failure)     chronic systolic CHF  . Hypertension   . Atrial fibrillation   . Obesity   . Coronary artery disease     cardiac catheterization in 1997 -- Est. EF of 50%  . Fatigue   . Stress   . Chest pain   . Shortness of breath   . Peptic ulcer 1985  . Angina   . Anemia   . Cancer     ENDOMETRIAL  . GERD (gastroesophageal reflux disease)   . Depression   . Sinoatrial node dysfunction     Past Surgical History  Procedure Date  . Cardiac catheterization 02/21/96    Est. EF of 50%  . Vesicovaginal fistula closure w/ tah 1995  . Abdominal hysterectomy   . Tonsillectomy   . Joint replacement     LEFT HIP   02/2008  . Coronary angioplasty with stent placement 02/15/11    "one"  . Cholecystectomy fall 2001  . Insert / replace / remove pacemaker 07/23/99    atrial insertion of the AV node -- Medtronic single chamber pacemaker   . Dilation and curettage of uterus     "had some when I was 18-22"    History  Smoking status  . Former  Smoker -- 1.0 packs/day for 35 years  . Types: Cigarettes  Smokeless tobacco  . Never Used  Comment: quit smoking "11/1989"    History  Alcohol Use  . Yes    "glass of wine q once in awhile"    Family History  Problem Relation Age of Onset  . Heart disease Mother   . Hypertension Mother     Reviw of Systems:  Reviewed in the HPI.  All other systems are negative.  Physical Exam: BP 135/87  Pulse 78  Ht 5' 8.5" (1.74 m)  Wt 234 lb 1.9 oz (106.196 kg)  BMI 35.08 kg/m2 The patient is alert and oriented x 3.  The mood and affect are normal.   Skin: warm and dry.  Color is normal.    HEENT:   Normocephalic/atraumatic. Normal carotids. No JVD.  Lungs: Lungs are clear.   Heart: Regular rate S1-S2.    Abdomen: Abdomen soft. Has good bowel sounds.  Extremities:  No clubbing cyanosis or edema the  Neuro:  Exam is nonfocal.    ECG:   Assessment / Plan:

## 2011-03-15 NOTE — Assessment & Plan Note (Signed)
Joyce Terry is doing well from a cardiac standpoint. She's not having any episodes of angina. In fact she is feeling 100% better and she was last month. She has a drug-eluting stent in the mid LAD and she is likely to need Lasix for the rest of her life. Fortunately she's not had any obvious GI bleed. We'll check a CBC today. I'll see her again in 6 months for an office visit, and fasting lab work.

## 2011-03-15 NOTE — Patient Instructions (Addendum)
Your physician wants you to follow-up in: 6 months You will receive a reminder letter in the mail two months in advance. If you don't receive a letter, please call our office to schedule the follow-up appointment.   Your physician recommends that you return for lab work in: Today= cbc then fasting labs at 6 mo office visit.

## 2011-03-16 ENCOUNTER — Encounter: Payer: Self-pay | Admitting: Cardiovascular Disease

## 2011-03-16 NOTE — Progress Notes (Signed)
Patient called with lab results. Pt verbalized understanding. 1 month lab set up. Alfonso Ramus RN

## 2011-03-24 ENCOUNTER — Ambulatory Visit: Payer: Medicare Other | Admitting: Cardiovascular Disease

## 2011-04-05 ENCOUNTER — Other Ambulatory Visit: Payer: Self-pay | Admitting: Cardiovascular Disease

## 2011-04-12 ENCOUNTER — Encounter: Payer: Self-pay | Admitting: Internal Medicine

## 2011-04-20 ENCOUNTER — Other Ambulatory Visit: Payer: Medicare Other | Admitting: *Deleted

## 2011-04-26 ENCOUNTER — Other Ambulatory Visit (HOSPITAL_COMMUNITY): Payer: Self-pay | Admitting: *Deleted

## 2011-04-28 ENCOUNTER — Encounter (HOSPITAL_COMMUNITY): Payer: Self-pay

## 2011-04-28 ENCOUNTER — Encounter (HOSPITAL_COMMUNITY)
Admission: RE | Admit: 2011-04-28 | Discharge: 2011-04-28 | Disposition: A | Discharge status: Home / Self Care | Payer: Medicare Other | Source: Ambulatory Visit | Attending: Internal Medicine | Admitting: Internal Medicine

## 2011-04-28 DIAGNOSIS — D509 Iron deficiency anemia, unspecified: Secondary | ICD-10-CM | POA: Insufficient documentation

## 2011-04-28 MED ORDER — SODIUM CHLORIDE 0.9 % IV SOLN
1385.0000 mg | Freq: Once | INTRAVENOUS | Status: AC
  Administered 2011-04-28: 1385 mg via INTRAVENOUS
  Filled 2011-04-28: qty 27.7

## 2011-04-28 MED ORDER — SODIUM CHLORIDE 0.9 % IV SOLN
25.0000 mg | Freq: Once | INTRAVENOUS | Status: AC
  Administered 2011-04-28: 25 mg via INTRAVENOUS
  Filled 2011-04-28: qty 0.5

## 2011-04-28 MED ORDER — SODIUM CHLORIDE 0.9 % IV SOLN
Freq: Once | INTRAVENOUS | Status: AC
  Administered 2011-04-28: 09:00:00 via INTRAVENOUS

## 2011-06-01 ENCOUNTER — Ambulatory Visit (INDEPENDENT_AMBULATORY_CARE_PROVIDER_SITE_OTHER): Payer: Medicare Other | Admitting: Internal Medicine

## 2011-06-01 ENCOUNTER — Encounter: Payer: Self-pay | Admitting: Internal Medicine

## 2011-06-01 VITALS — BP 110/60 | HR 84 | Wt 227.8 lb

## 2011-06-01 DIAGNOSIS — T44905A Adverse effect of unspecified drugs primarily affecting the autonomic nervous system, initial encounter: Secondary | ICD-10-CM

## 2011-06-01 DIAGNOSIS — Z950 Presence of cardiac pacemaker: Secondary | ICD-10-CM

## 2011-06-01 DIAGNOSIS — R05 Cough: Secondary | ICD-10-CM | POA: Insufficient documentation

## 2011-06-01 DIAGNOSIS — I4891 Unspecified atrial fibrillation: Secondary | ICD-10-CM

## 2011-06-01 DIAGNOSIS — I2510 Atherosclerotic heart disease of native coronary artery without angina pectoris: Secondary | ICD-10-CM

## 2011-06-01 DIAGNOSIS — I495 Sick sinus syndrome: Secondary | ICD-10-CM

## 2011-06-01 LAB — PACEMAKER DEVICE OBSERVATION
BATTERY VOLTAGE: 2.79 V
BMOD-0005RV: 95 {beats}/min
VENTRICULAR PACING PM: 100

## 2011-06-01 MED ORDER — LOSARTAN POTASSIUM 25 MG PO TABS: 25.0000 mg | ORAL_TABLET | Freq: Every day | ORAL | Status: DC

## 2011-06-01 NOTE — Assessment & Plan Note (Signed)
She denies anginal symptoms. She'll continue her current medical therapy. 

## 2011-06-01 NOTE — Patient Instructions (Signed)
Your physician wants you to follow-up in: 02/2012 You will receive a reminder letter in the mail two months in advance. If you don't receive a letter, please call our office to schedule the follow-up appointment.  Your physician has recommended you make the following change in your medication:  1) Stop Benazepril 2) Start Losartan 25mg  daily

## 2011-06-01 NOTE — Progress Notes (Signed)
HPI Joyce Terry returns today for followup. She is a very pleasant 76 year old woman with morbid obesity, hypertension, and chronic diastolic heart failure. She also has dyslipidemia and coronary disease. The patient has had diarrhea which has improved. She also notes a chronic cough which is nonproductive. The cough is not related to position. Allergies  Allergen Reactions  . Codeine Nausea And Vomiting  . Demerol Nausea And Vomiting  . Morphine And Related Itching  . Penicillins Hives  . Tape      Current Outpatient Prescriptions  Medication Sig Dispense Refill  . ALPRAZolam (XANAX) 0.25 MG tablet Take 0.125 mg by mouth at bedtime as needed. For anxiety       . carvedilol (COREG) 3.125 MG tablet take 1 tablet twice a day  60 tablet  5  . clopidogrel (PLAVIX) 75 MG tablet Take 1 tablet (75 mg total) by mouth daily with breakfast.  30 tablet  6  . escitalopram (LEXAPRO) 10 MG tablet Take 10 mg by mouth daily.        Marland Kitchen esomeprazole (NEXIUM) 40 MG capsule Take 40 mg by mouth daily before breakfast.        . ezetimibe-simvastatin (VYTORIN) 10-20 MG per tablet Take 1 tablet by mouth at bedtime.       . fentaNYL (DURAGESIC) 100 MCG/HR Place 1 patch onto the skin every 3 (three) days.       . Fluticasone-Salmeterol (ADVAIR) 250-50 MCG/DOSE AEPB Inhale 1 puff into the lungs every 12 (twelve) hours as needed. For shortness of breath       . glyBURIDE-metformin (GLUCOVANCE) 5-500 MG per tablet Take 2 tablets by mouth 2 (two) times daily with a meal.       . insulin aspart (NOVOLOG) 100 UNIT/ML injection Inject 4-8 Units into the skin 2 (two) times daily. If blood sugar>250, use 7-8 units. If if <200, use 4-5 units       . insulin glargine (LANTUS) 100 UNIT/ML injection Inject 30 Units into the skin at bedtime.       . iron polysaccharides (NIFEREX) 150 MG capsule Take 150 mg by mouth 2 (two) times daily.       . nitroGLYCERIN (NITROSTAT) 0.4 MG SL tablet Place 1 tablet (0.4 mg total) under the tongue  every 5 (five) minutes as needed for chest pain.  25 tablet  3  . OVER THE COUNTER MEDICATION Place 1-2 drops into both eyes daily as needed. For dry/itchy eyes --saline drops       . oxyCODONE-acetaminophen (PERCOCET) 10-325 MG per tablet Take 1 tablet by mouth every 4 (four) hours as needed. For pain       . torsemide (DEMADEX) 20 MG tablet take 1 tablet by mouth once daily  30 tablet  5  . warfarin (COUMADIN) 5 MG tablet Take 2.5-5 mg by mouth daily. Take 2.5 mg tues,and fri, take 5 mg all other days       . zolpidem (AMBIEN) 10 MG tablet Take 5 mg by mouth at bedtime as needed. For sleep      . losartan (COZAAR) 25 MG tablet Take 1 tablet (25 mg total) by mouth daily.  30 tablet  11     Past Medical History  Diagnosis Date  . CHF (congestive heart failure)     chronic systolic CHF  . Hypertension   . Atrial fibrillation   . Obesity   . Coronary artery disease     cardiac catheterization in 1997 -- Est. EF of 50%  .  Fatigue   . Stress   . Chest pain   . Shortness of breath   . Peptic ulcer 1985  . Angina   . Anemia   . Cancer     ENDOMETRIAL  . GERD (gastroesophageal reflux disease)   . Depression   . Sinoatrial node dysfunction     ROS:   All systems reviewed and negative except as noted in the HPI.   Past Surgical History  Procedure Date  . Cardiac catheterization 02/21/96    Est. EF of 50%  . Vesicovaginal fistula closure w/ tah 1995  . Abdominal hysterectomy   . Tonsillectomy   . Joint replacement     LEFT HIP   02/2008  . Coronary angioplasty with stent placement 02/15/11    "one"  . Cholecystectomy fall 2001  . Insert / replace / remove pacemaker 07/23/99    atrial insertion of the AV node -- Medtronic single chamber pacemaker   . Dilation and curettage of uterus     "had some when I was [age]"     Family History  Problem Relation Age of Onset  . Heart disease Mother   . Hypertension Mother      History   Social History  . Marital Status:  Divorced    Spouse Name: N/A    Number of Children: N/A  . Years of Education: N/A   Occupational History  . Not on file.   Social History Main Topics  . Smoking status: Former Smoker -- 1.0 packs/day for 35 years    Types: Cigarettes  . Smokeless tobacco: Never Used   Comment: quit smoking "11/1989"  . Alcohol Use: Yes     "glass of wine q once in awhile"  . Drug Use: No  . Sexually Active: No   Other Topics Concern  . Not on file   Social History Narrative  . No narrative on file     BP 110/60  Pulse 84  Wt 103.329 kg (227 lb 12.8 oz)  Physical Exam:  Well appearing 76 year old woman, NAD HEENT: Unremarkable Neck:  No JVD, no thyromegally Lungs:  Clear with no wheezes, rales, or rhonchi. HEART:  Regular rate rhythm, no murmurs, no rubs, no clicks S2 is split. Abd:  Obese, soft, positive bowel sounds, no organomegally, no rebound, no guarding Ext:  2 plus pulses, no edema, no cyanosis, no clubbing Skin:  No rashes no nodules Neuro:  CN II through XII intact, motor grossly intact  DEVICE  Normal device function.  See PaceArt for details.   Assess/Plan:

## 2011-06-01 NOTE — Assessment & Plan Note (Signed)
Her ventricular rates appear to be well controlled. No change in medical therapy.

## 2011-06-01 NOTE — Assessment & Plan Note (Signed)
Her device is working normally. We will plan to recheck in several months. 

## 2011-06-01 NOTE — Assessment & Plan Note (Signed)
I suspect her cough is related to benazepril. I've asked the patient to stop this medication and we will try her on Cozaar.

## 2011-06-16 ENCOUNTER — Other Ambulatory Visit (HOSPITAL_COMMUNITY): Payer: Self-pay | Admitting: Internal Medicine

## 2011-06-16 DIAGNOSIS — Z1231 Encounter for screening mammogram for malignant neoplasm of breast: Secondary | ICD-10-CM

## 2011-07-11 ENCOUNTER — Ambulatory Visit (HOSPITAL_COMMUNITY)
Admission: RE | Admit: 2011-07-11 | Discharge: 2011-07-11 | Disposition: A | Discharge status: Home / Self Care | Payer: Medicare Other | Source: Ambulatory Visit | Attending: Internal Medicine | Admitting: Internal Medicine

## 2011-07-11 DIAGNOSIS — Z1231 Encounter for screening mammogram for malignant neoplasm of breast: Secondary | ICD-10-CM | POA: Insufficient documentation

## 2011-07-26 ENCOUNTER — Other Ambulatory Visit: Payer: Self-pay | Admitting: Cardiovascular Disease

## 2011-08-03 ENCOUNTER — Telehealth: Payer: Self-pay | Admitting: Cardiovascular Disease

## 2011-08-03 NOTE — Telephone Encounter (Signed)
C/O DIZZINESS X 2-3 WEEKS, DENIES CP, ONLY HAS DIZZINESS WHEN LAYS DOWN, NOT EVERY TIME BUT FREQUENTLY. HAS BEEN HAVING HEAD CONGESTION AND HAS STARTED ALLERGY MEDS. DENIES DIZZINESS WHEN SITTING AND STANDING, GETS BETTER WHEN STANDS OR SITS UP. TOLD TO CALL PCP FOR FURTHER WORK UP, PT AGREED TO PLAN.

## 2011-08-03 NOTE — Telephone Encounter (Signed)
New msg Pt wants to talk to you about some dizziness she has been having.

## 2011-08-12 ENCOUNTER — Telehealth: Payer: Self-pay | Admitting: Internal Medicine

## 2011-08-12 NOTE — Telephone Encounter (Signed)
Patient is on coumadin and has had to have recent cardiac procedures.  She will come in and see Dr Marina Goodell on 09/19/11

## 2011-09-06 ENCOUNTER — Other Ambulatory Visit: Payer: Self-pay | Admitting: Cardiovascular Disease

## 2011-09-19 ENCOUNTER — Ambulatory Visit (INDEPENDENT_AMBULATORY_CARE_PROVIDER_SITE_OTHER): Payer: Medicare Other | Admitting: Internal Medicine

## 2011-09-19 ENCOUNTER — Encounter: Payer: Self-pay | Admitting: Internal Medicine

## 2011-09-19 VITALS — BP 120/62 | HR 72 | Ht 68.5 in | Wt 230.3 lb

## 2011-09-19 DIAGNOSIS — D509 Iron deficiency anemia, unspecified: Secondary | ICD-10-CM

## 2011-09-19 DIAGNOSIS — K219 Gastro-esophageal reflux disease without esophagitis: Secondary | ICD-10-CM

## 2011-09-19 DIAGNOSIS — R1310 Dysphagia, unspecified: Secondary | ICD-10-CM

## 2011-09-19 DIAGNOSIS — Z1211 Encounter for screening for malignant neoplasm of colon: Secondary | ICD-10-CM

## 2011-09-19 DIAGNOSIS — Z86010 Personal history of colonic polyps: Secondary | ICD-10-CM

## 2011-09-19 DIAGNOSIS — D689 Coagulation defect, unspecified: Secondary | ICD-10-CM

## 2011-09-19 NOTE — Progress Notes (Signed)
HISTORY OF PRESENT ILLNESS:  Joyce Terry is a 76 y.o. female with multiple significant medical problems including hypertension, chronic atrial fibrillation, chronic systemic anticoagulation with Coumadin, insulin requiring diabetes mellitus, obesity, congestive heart failure, coronary artery disease status post stenting of the LAD November 2012 with resultant chronic Plavix therapy, GERD with peptic stricture, and adenomatous colon polyps. She also has a history of iron deficiency anemia. She presents today regarding surveillance colonoscopy and iron deficiency anemia. The patient initially underwent colonoscopy and upper endoscopy in June of 2009. Colonoscopy revealed multiple colon polyps which were adenomatous as well as tubulovillous adenomatous. She was also noted to have marked diverticulosis. Upper endoscopy was unremarkable except for fundic gland polyps. She was subsequently seen in December 2010 for iron deficiency anemia. She was on Coumadin therapy at that time, though this was interrupted for her procedures. Colonoscopy at that time revealed diminutive adenomatous polyps and diverticulosis as well as a small AVM of the cecum. Upper endoscopy revealed a distal esophageal ring which was dilated for complaints of dysphagia and since that time she has continued on PPI therapy with good control of reflux symptoms. She does report some mild recurrence of dysphagia, though states this is manageable. She is not taking iron therapy. Review of outside blood work from November and December 2012 revealed anemia with hemoglobin 6 between 8.4 9.4 and microcytosis of around 65. She tells me that she receive an iron infusion in January. Request of additional outside records finds a recent hemoglobin of 13.5 with an MCV of 78.2 on 08/04/2011. Aside from mild dysphagia, the patient's GI review of systems is negative. She continues on Coumadin and Plavix  REVIEW OF SYSTEMS:  All non-GI ROS negative except for  easy bruisability, exertional shortness of breath, joint aches  Past Medical History  Diagnosis Date  . CHF (congestive heart failure)     chronic systolic CHF  . Hypertension   . Atrial fibrillation   . Obesity   . Coronary artery disease     cardiac catheterization in 1997 -- Est. EF of 50%  . Fatigue   . Stress   . Chest pain   . Shortness of breath   . Peptic ulcer 1985  . Angina   . Anemia   . Cancer     ENDOMETRIAL  . GERD (gastroesophageal reflux disease)   . Depression   . Sinoatrial node dysfunction   . Gastric polyps   . Colon polyps   . Diverticulosis     Past Surgical History  Procedure Date  . Cardiac catheterization 02/21/96    Est. EF of 50%  . Vesicovaginal fistula closure w/ tah 1995  . Abdominal hysterectomy   . Tonsillectomy   . Joint replacement     LEFT HIP   02/2008  . Coronary angioplasty with stent placement 02/15/11    "one"  . Cholecystectomy fall 2001  . Insert / replace / remove pacemaker 07/23/99    atrial insertion of the AV node -- Medtronic single chamber pacemaker   . Dilation and curettage of uterus     "had some when I was [age]"    Social History Joyce Terry  reports that she has quit smoking. Her smoking use included Cigarettes. She has a 35 pack-year smoking history. She has never used smokeless tobacco. She reports that she drinks alcohol. She reports that she does not use illicit drugs.  family history includes Heart disease in her mother and Hypertension in her mother.  Allergies  Allergen Reactions  . Codeine Nausea And Vomiting  . Demerol Nausea And Vomiting  . Morphine And Related Itching  . Penicillins Hives  . Tape        PHYSICAL EXAMINATION: Vital signs: BP 120/62  Pulse 72  Ht 5' 8.5" (1.74 m)  Wt 230 lb 4.8 oz (104.463 kg)  BMI 34.51 kg/m2 General: Well-developed,obese, well-nourished, no acute distress HEENT: Sclerae are anicteric, conjunctiva pink. Oral mucosa intact Lungs: Clear Heart:  Regular Abdomen: soft,obese, nontender, nondistended, no obvious ascites, no peritoneal signs, normal bowel sounds. No organomegaly. Extremities: No edema. Scattered bruises on the upper extremities Psychiatric: alert and oriented x3. Cooperative     ASSESSMENT:  #1. Recurrent iron deficiency anemia in a patient on Plavix and Coumadin. Previously documented cecal AVM. May have additional AVMs as a cause for iron deficiency anemia. Could also have nonspecific mucosal oozing on these therapies. Fortunately, no gross bleeding. As well, good response to iron infusion therapy. #2. GERD with a history of peptic stricture. No GERD symptoms on Nexium. Some mild recurrent dysphagia. #3. History of multiple adenomatous colon polyps. Last examination February 2011. #4. Multiple significant medical problems including LAD stent placement in November. Now on Plavix in addition to Coumadin.   PLAN:  At this point, it would be best to forego any endoscopic evaluations given her other medical issues and current need for medications as outlined. She should continue on PPI therapy to control reflux symptoms as well as prevent significant upper GI mucosal lesions. Assuming that her iron deficiency is secondary to her known AVMs and/or nonspecific mucosal oozing, she should continue to have regular CBCs and ferritin levels with her primary provider. As well, periodic iron infusion therapy would be quite reasonable since she did not respond to oral therapy and did have a good response to IV therapy. If she were to develop obvious GI bleeding that was significant, or iron deficiency anemia felt secondary to blood loss which did not respond to iron therapy, then GI intervention would be appropriate. If this is not the case, the mild like to see her routinely in about 1 year. We can readdress surveillance colonoscopy as well as possible therapeutic upper endoscopy at that time. She understands and agrees

## 2011-09-19 NOTE — Patient Instructions (Addendum)
Please follow up with Dr. Perry in one year 

## 2011-09-21 ENCOUNTER — Ambulatory Visit (INDEPENDENT_AMBULATORY_CARE_PROVIDER_SITE_OTHER): Payer: Medicare Other | Admitting: Cardiovascular Disease

## 2011-09-21 ENCOUNTER — Encounter: Payer: Self-pay | Admitting: Cardiovascular Disease

## 2011-09-21 VITALS — BP 139/90 | HR 76 | Ht 68.5 in | Wt 228.1 lb

## 2011-09-21 DIAGNOSIS — I2510 Atherosclerotic heart disease of native coronary artery without angina pectoris: Secondary | ICD-10-CM

## 2011-09-21 DIAGNOSIS — I4891 Unspecified atrial fibrillation: Secondary | ICD-10-CM

## 2011-09-21 DIAGNOSIS — I509 Heart failure, unspecified: Secondary | ICD-10-CM

## 2011-09-21 DIAGNOSIS — E119 Type 2 diabetes mellitus without complications: Secondary | ICD-10-CM

## 2011-09-21 DIAGNOSIS — D509 Iron deficiency anemia, unspecified: Secondary | ICD-10-CM

## 2011-09-21 LAB — CBC WITH DIFFERENTIAL/PLATELET
Basophils Absolute: 0 10*3/uL (ref 0.0–0.1)
Lymphocytes Relative: 14.2 % (ref 12.0–46.0)
Monocytes Relative: 7.4 % (ref 3.0–12.0)
Neutrophils Relative %: 76.2 % (ref 43.0–77.0)
Platelets: 251 10*3/uL (ref 150.0–400.0)
RDW: 15.8 % — ABNORMAL HIGH (ref 11.5–14.6)

## 2011-09-21 LAB — BASIC METABOLIC PANEL
CO2: 31 mEq/L (ref 19–32)
Chloride: 106 mEq/L (ref 96–112)
Sodium: 144 mEq/L (ref 135–145)

## 2011-09-21 NOTE — Assessment & Plan Note (Signed)
He has known anemia which is worsened by the fast that she is on chronic antiocoagulation and also has some hemorrhoids. Her last hemoglobin was 13. Because of her symptoms of progressive shortness breath we'll check a CBC today.

## 2011-09-21 NOTE — Assessment & Plan Note (Signed)
Joyce Terry presents today with some worsening shortness of breath. It is not nearly as severe as it was back in November, 2012 she presented with a tight 90% LAD stenosis. She does not think that her symptoms are consistent with her previous episodes of angina.  We will continue with current medications. We'll check labs includes including a basic metabolic profile and a CBC.   I'm concerned that she may have some worsening anemia.

## 2011-09-21 NOTE — Assessment & Plan Note (Signed)
She has mild chronic congestive heart failure. She's having progressive symptoms. Her last ejection fraction was 55-60%. She does have some mild mitral regurgitation moderate tricuspid regurgitation. We'll continue with her same medications.

## 2011-09-21 NOTE — Patient Instructions (Signed)
Your physician wants you to follow-up in: 6 MONTHS WITH DR Elease Hashimoto  You will receive a reminder letter in the mail two months in advance. If you don't receive a letter, please call our office to schedule the follow-up appointment. Your physician recommends that you continue on your current medications as directed. Please refer to the Current Medication list given to you today. Your physician recommends that you return for lab work in: TODAY  BMET CBC DX  414.1 428.0 6 MONTHS  FASTING  LIPID LIVER  BMET  DX 428.0 V58.69 272.4

## 2011-09-21 NOTE — Progress Notes (Signed)
Joyce Terry Date of Birth  Nov 03, 1935       [facility]    [place] [address]  [address] [place], [place]  [address]   [place], [place]  [address] [phone]     [phone]   Fax  [phone]    Fax [phone]  Problem List: 1. Coronary artery disease- s/p PCI of LAD 11/12 2. Status post AV node ablation-Pacemaker 3. Mild congestive heart failure 4. Anemia 5. Atrial fibrillation 6. Hyperlipidemia 7. Diabetes mellitus  History of Present Illness: Joyce Terry is a 76 year old female with a history of coronary artery disease and atrial fibrillation. She is status post  PTCA and stenting of her mid LAD in November 2012.   She also has a history of a GI bleed with a recent hemoglobin of 8.4. She had her pacer generator changed 2 weeks after her angioplasty/stent. She has not noticed any dark or tarry stools.   She has noticed worsening dyspnea recently  Current Outpatient Prescriptions on File Prior to Visit  Medication Sig Dispense Refill  . ALPRAZolam (XANAX) 0.25 MG tablet Take 0.125 mg by mouth at bedtime as needed. For anxiety       . carvedilol (COREG) 3.125 MG tablet take 1 tablet twice a day  60 tablet  5  . clopidogrel (PLAVIX) 75 MG tablet take 1 tablet by mouth once daily with BREAKFAST  30 tablet  6  . escitalopram (LEXAPRO) 10 MG tablet Take 10 mg by mouth daily.        Marland Kitchen esomeprazole (NEXIUM) 40 MG capsule Take 40 mg by mouth daily before breakfast.        . ezetimibe-simvastatin (VYTORIN) 10-20 MG per tablet Take 1 tablet by mouth at bedtime.       . fentaNYL (DURAGESIC) 100 MCG/HR Place 1 patch onto the skin every 3 (three) days.       . Fluticasone-Salmeterol (ADVAIR) 250-50 MCG/DOSE AEPB Inhale 1 puff into the lungs every 12 (twelve) hours as needed. For shortness of breath       . glyBURIDE-metformin (GLUCOVANCE) 5-500 MG per tablet Take 2 tablets by mouth 2 (two) times daily with a meal.       . insulin  aspart (NOVOLOG) 100 UNIT/ML injection Inject 4-8 Units into the skin 2 (two) times daily. If blood sugar>250, use 7-8 units. If if <200, use 4-5 units       . insulin glargine (LANTUS) 100 UNIT/ML injection Inject 30 Units into the skin at bedtime.       . iron polysaccharides (NIFEREX) 150 MG capsule Take 150 mg by mouth 2 (two) times daily.       Marland Kitchen losartan (COZAAR) 25 MG tablet Take 1 tablet (25 mg total) by mouth daily.  30 tablet  11  . nitroGLYCERIN (NITROSTAT) 0.4 MG SL tablet Place 1 tablet (0.4 mg total) under the tongue every 5 (five) minutes as needed for chest pain.  25 tablet  3  . OVER THE COUNTER MEDICATION Place 1-2 drops into both eyes daily as needed. For dry/itchy eyes --saline drops       . oxyCODONE-acetaminophen (PERCOCET) 10-325 MG per tablet Take 1 tablet by mouth every 4 (four) hours as needed. For pain       . torsemide (DEMADEX) 20 MG tablet take 1 tablet by mouth once daily  30 tablet  5  . warfarin (COUMADIN) 5 MG tablet Take 2.5-5 mg by mouth daily. Take  2.5 mg tues,and fri, take 5 mg all other days       . zolpidem (AMBIEN) 10 MG tablet Take 5 mg by mouth at bedtime as needed. For sleep        Allergies  Allergen Reactions  . Codeine Nausea And Vomiting  . Demerol Nausea And Vomiting  . Morphine And Related Itching  . Penicillins Hives  . Tape     Past Medical History  Diagnosis Date  . CHF (congestive heart failure)     chronic systolic CHF  . Hypertension   . Atrial fibrillation   . Obesity   . Coronary artery disease     cardiac catheterization in 1997 -- Est. EF of 50%  . Fatigue   . Stress   . Chest pain   . Shortness of breath   . Peptic ulcer 1985  . Angina   . Anemia   . Cancer     ENDOMETRIAL  . GERD (gastroesophageal reflux disease)   . Depression   . Sinoatrial node dysfunction   . Gastric polyps   . Colon polyps   . Diverticulosis     Past Surgical History  Procedure Date  . Cardiac catheterization 02/21/96    Est. EF of  50%  . Vesicovaginal fistula closure w/ tah 1995  . Abdominal hysterectomy   . Tonsillectomy   . Joint replacement     LEFT HIP   02/2008  . Coronary angioplasty with stent placement 02/15/11    "one"  . Cholecystectomy fall 2001  . Insert / replace / remove pacemaker 07/23/99    atrial insertion of the AV node -- Medtronic single chamber pacemaker   . Dilation and curettage of uterus     "had some when I was [age]"    History  Smoking status  . Former Smoker -- 1.0 packs/day for 35 years  . Types: Cigarettes  Smokeless tobacco  . Never Used  Comment: quit smoking "11/1989"    History  Alcohol Use  . Yes    "glass of wine q once in awhile"    Family History  Problem Relation Age of Onset  . Heart disease Mother   . Hypertension Mother     Reviw of Systems:  Reviewed in the HPI.  All other systems are negative.  Physical Exam: Blood pressure 139/90, pulse 76, height 5' 8.5" (1.74 m), weight 228 lb 1.9 oz (103.475 kg). General: Well developed, well nourished, in no acute distress.  Head: Normocephalic, atraumatic, sclera non-icteric, mucus membranes are moist,   Neck: Supple. Carotids are 2 + without bruits. No JVD  Lungs: Clear bilaterally to auscultation.  Heart: regular rate.  normal  S1 S2. No murmurs, gallops or rubs.  Abdomen: Soft, non-tender, non-distended with normal bowel sounds. No hepatomegaly. No rebound/guarding. No masses.  Msk:  Strength and tone are normal  Extremities: No clubbing or cyanosis. No edema.  Distal pedal pulses are 2+ and equal bilaterally.  Neuro: Alert and oriented X 3. Moves all extremities spontaneously.  Psych:  Responds to questions appropriately with a normal affect.  ECG:  Assessment / Plan:

## 2011-09-21 NOTE — Assessment & Plan Note (Signed)
She status post AV node ablation. She has a pacemaker. She remains on chronic anticoagulation.

## 2011-09-23 ENCOUNTER — Other Ambulatory Visit: Payer: Self-pay | Admitting: Cardiovascular Disease

## 2011-09-23 NOTE — Telephone Encounter (Signed)
Fax Received. Refill Completed. Joyce Terry (R.M.A)   

## 2011-11-25 ENCOUNTER — Other Ambulatory Visit: Payer: Self-pay | Admitting: Internal Medicine

## 2011-11-25 MED ORDER — NITROGLYCERIN 0.4 MG SL SUBL: 0.4000 mg | SUBLINGUAL_TABLET | SUBLINGUAL | Status: DC | PRN

## 2011-11-29 ENCOUNTER — Telehealth: Payer: Self-pay | Admitting: *Deleted

## 2011-11-29 ENCOUNTER — Telehealth: Payer: Self-pay | Admitting: Cardiovascular Disease

## 2011-11-29 NOTE — Telephone Encounter (Signed)
Pt states her rx for nitro was not sent to pharmacy--advised i will call in again and she can go pic up in 1 hour--called in to [facility] [address]--ntg 0.4 mg sl--put 1 tab under tongue fpr CP--take 1 tab sl q , not to exceed 3 tabs in 15 min--#25 with 3 refills --pt agrees

## 2011-11-29 NOTE — Telephone Encounter (Signed)
Error

## 2012-01-20 ENCOUNTER — Other Ambulatory Visit: Payer: Self-pay | Admitting: Cardiovascular Disease

## 2012-02-24 ENCOUNTER — Encounter: Payer: Self-pay | Admitting: *Deleted

## 2012-02-26 HISTORY — PX: PTCA: SHX146

## 2012-02-28 ENCOUNTER — Encounter: Payer: Self-pay | Admitting: Internal Medicine

## 2012-02-28 ENCOUNTER — Ambulatory Visit (INDEPENDENT_AMBULATORY_CARE_PROVIDER_SITE_OTHER): Payer: Medicare Other | Admitting: Internal Medicine

## 2012-02-28 VITALS — BP 130/65 | HR 71 | Ht 68.5 in | Wt 233.0 lb

## 2012-02-28 DIAGNOSIS — R079 Chest pain, unspecified: Secondary | ICD-10-CM

## 2012-02-28 DIAGNOSIS — Z950 Presence of cardiac pacemaker: Secondary | ICD-10-CM

## 2012-02-28 DIAGNOSIS — I4891 Unspecified atrial fibrillation: Secondary | ICD-10-CM

## 2012-02-28 DIAGNOSIS — I208 Other forms of angina pectoris: Secondary | ICD-10-CM | POA: Insufficient documentation

## 2012-02-28 LAB — PACEMAKER DEVICE OBSERVATION
BATTERY VOLTAGE: 2.79 V
BMOD-0003RV: 30
BRDY-0004RV: 120 {beats}/min
RV LEAD IMPEDENCE PM: 583 Ohm

## 2012-02-28 NOTE — Progress Notes (Signed)
HPI Joyce Terry returns today for followup. She is a 76 year old woman with known coronary disease status post catheterizations and stents, symptomatic bradycardia with complete heart block, status post permanent pacemaker insertion. The patient notes increasing chest pressure over the last several weeks. Her symptoms are worsened with exertion. She has had no syncope. Her symptoms are as they were just over a year ago when she underwent catheterization and stenting of the LAD. At that time she had 90% stenosis. Allergies  Allergen Reactions  . Codeine Nausea And Vomiting  . Demerol Nausea And Vomiting  . Morphine And Related Itching  . Penicillins Hives  . Tape      Current Outpatient Prescriptions  Medication Sig Dispense Refill  . ALPRAZolam (XANAX) 0.25 MG tablet Take 0.125 mg by mouth at bedtime as needed. For anxiety       . carvedilol (COREG) 3.125 MG tablet take 1 tablet twice a day  60 tablet  5  . clopidogrel (PLAVIX) 75 MG tablet take 1 tablet by mouth once daily with BREAKFAST  30 tablet  6  . escitalopram (LEXAPRO) 10 MG tablet Take 10 mg by mouth daily.        Marland Kitchen esomeprazole (NEXIUM) 40 MG capsule Take 40 mg by mouth daily before breakfast.        . ezetimibe-simvastatin (VYTORIN) 10-20 MG per tablet Take 1 tablet by mouth at bedtime.       . fentaNYL (DURAGESIC) 100 MCG/HR Place 1 patch onto the skin every 3 (three) days. 1 patch daily      . Fluticasone-Salmeterol (ADVAIR) 250-50 MCG/DOSE AEPB Inhale 1 puff into the lungs every 12 (twelve) hours as needed. For shortness of breath       . glyBURIDE-metformin (GLUCOVANCE) 5-500 MG per tablet Take 2 tablets by mouth 2 (two) times daily with a meal.       . insulin aspart (NOVOLOG) 100 UNIT/ML injection Inject 4-8 Units into the skin 2 (two) times daily. If blood sugar>250, use 7-8 units. If if <200, use 4-5 units       . insulin glargine (LANTUS) 100 UNIT/ML injection Inject 30 Units into the skin at bedtime.       Marland Kitchen  losartan (COZAAR) 25 MG tablet Take 1 tablet (25 mg total) by mouth daily.  30 tablet  11  . nitroGLYCERIN (NITROSTAT) 0.4 MG SL tablet Place 1 tablet (0.4 mg total) under the tongue every 5 (five) minutes as needed for chest pain.  25 tablet  3  . OVER THE COUNTER MEDICATION Place 1-2 drops into both eyes daily as needed. For dry/itchy eyes --saline drops       . oxyCODONE-acetaminophen (PERCOCET) 10-325 MG per tablet Take 1 tablet by mouth every 4 (four) hours as needed. For pain       . torsemide (DEMADEX) 20 MG tablet take 1 tablet by mouth once daily  30 tablet  5  . warfarin (COUMADIN) 5 MG tablet Take 2.5-5 mg by mouth daily. Take 2.5 mg tues,and fri, take 5 mg all other days       . zolpidem (AMBIEN) 10 MG tablet Take 5 mg by mouth at bedtime as needed. For sleep         Past Medical History  Diagnosis Date  . CHF (congestive heart failure)     chronic systolic CHF  . Hypertension   . Atrial fibrillation   . Obesity   . Coronary artery disease     cardiac catheterization in  1997 -- Est. EF of 50%  . Fatigue   . Stress   . Chest pain   . Shortness of breath   . Peptic ulcer 1985  . Angina   . Anemia   . Cancer     ENDOMETRIAL  . GERD (gastroesophageal reflux disease)   . Depression   . Sinoatrial node dysfunction   . Gastric polyps   . Colon polyps   . Diverticulosis     ROS:   All systems reviewed and negative except as noted in the HPI.   Past Surgical History  Procedure Date  . Cardiac catheterization 02/21/96    Est. EF of 50%  . Vesicovaginal fistula closure w/ tah 1995  . Abdominal hysterectomy   . Tonsillectomy   . Joint replacement     LEFT HIP   02/2008  . Coronary angioplasty with stent placement 02/15/11    "one"  . Cholecystectomy fall 2001  . Insert / replace / remove pacemaker 07/23/99    atrial insertion of the AV node -- Medtronic single chamber pacemaker   . Dilation and curettage of uterus     "had some when I was [age]"     Family  History  Problem Relation Age of Onset  . Heart disease Mother   . Hypertension Mother      History   Social History  . Marital Status: Divorced    Spouse Name: N/A    Number of Children: N/A  . Years of Education: N/A   Occupational History  . Retired    Social History Main Topics  . Smoking status: Former Smoker -- 1.0 packs/day for 35 years    Types: Cigarettes  . Smokeless tobacco: Never Used     Comment: quit smoking "11/1989"  . Alcohol Use: Yes     Comment: "glass of wine q once in awhile"  . Drug Use: No  . Sexually Active: No   Other Topics Concern  . Not on file   Social History Narrative  . No narrative on file     BP 130/65  Pulse 71  Ht 5' 8.5" (1.74 m)  Wt 233 lb (105.688 kg)  BMI 34.91 kg/m2  Physical Exam:  Well appearing 76 year old woman, NAD HEENT: Unremarkable Neck:  No JVD, no thyromegally Lungs:  Clear with no wheezes, rales, or rhonchi. HEART:  Regular rate rhythm, no murmurs, no rubs, no clicks Abd:  soft, positive bowel sounds, no organomegally, no rebound, no guarding Ext:  2 plus pulses, no edema, no cyanosis, no clubbing Skin:  No rashes no nodules Neuro:  CN II through XII intact, motor grossly intact  ECG - normal sinus rhythm with AV sequential pacing  DEVICE  Normal device function.  See PaceArt for details.   Assess/Plan:

## 2012-02-28 NOTE — Patient Instructions (Addendum)
Your physician recommends that you schedule a follow-up appointment in: 2 weeks with Dr Elease Hashimoto  Your physician has requested that you have a lexiscan myoview. For further information please visit [email or web address] Please follow instruction sheet, as given.      Your physician wants you to follow-up in: 12 months with Dr Court Joy will receive a reminder letter in the mail two months in advance. If you don't receive a letter, please call our office to schedule the follow-up appointment.

## 2012-02-28 NOTE — Assessment & Plan Note (Signed)
Her Medtronic dual-chamber pacemaker is working normally. We'll plan to recheck in several months. 

## 2012-02-28 NOTE — Assessment & Plan Note (Addendum)
Her anginal symptoms have increased in frequency and severity. They occur with exertion but occasionally even at rest, requiring nitroglycerin. They're very similar to what she had when she underwent catheterization and stenting just over a year ago. We'll have her undergo exercise treadmill testing with lexiscan Myoview. Pending the results of the scan, would recommend catheterization. She will followup with her primary cardiologist, Dr. Elease Hashimoto following the results of her scan.

## 2012-03-05 ENCOUNTER — Ambulatory Visit (HOSPITAL_COMMUNITY): Payer: Medicare Other | Attending: Internal Medicine | Admitting: Radiology

## 2012-03-05 ENCOUNTER — Encounter (HOSPITAL_COMMUNITY): Payer: Medicare Other

## 2012-03-05 VITALS — BP 108/52 | Ht 68.5 in | Wt 228.0 lb

## 2012-03-05 DIAGNOSIS — R079 Chest pain, unspecified: Secondary | ICD-10-CM

## 2012-03-05 DIAGNOSIS — I2510 Atherosclerotic heart disease of native coronary artery without angina pectoris: Secondary | ICD-10-CM

## 2012-03-05 DIAGNOSIS — E785 Hyperlipidemia, unspecified: Secondary | ICD-10-CM | POA: Insufficient documentation

## 2012-03-05 DIAGNOSIS — I447 Left bundle-branch block, unspecified: Secondary | ICD-10-CM | POA: Insufficient documentation

## 2012-03-05 DIAGNOSIS — R0789 Other chest pain: Secondary | ICD-10-CM | POA: Insufficient documentation

## 2012-03-05 DIAGNOSIS — Z794 Long term (current) use of insulin: Secondary | ICD-10-CM | POA: Insufficient documentation

## 2012-03-05 DIAGNOSIS — Z87891 Personal history of nicotine dependence: Secondary | ICD-10-CM | POA: Insufficient documentation

## 2012-03-05 DIAGNOSIS — R0989 Other specified symptoms and signs involving the circulatory and respiratory systems: Secondary | ICD-10-CM | POA: Insufficient documentation

## 2012-03-05 DIAGNOSIS — R0609 Other forms of dyspnea: Secondary | ICD-10-CM | POA: Insufficient documentation

## 2012-03-05 DIAGNOSIS — E119 Type 2 diabetes mellitus without complications: Secondary | ICD-10-CM | POA: Insufficient documentation

## 2012-03-05 DIAGNOSIS — R0602 Shortness of breath: Secondary | ICD-10-CM | POA: Insufficient documentation

## 2012-03-05 DIAGNOSIS — I10 Essential (primary) hypertension: Secondary | ICD-10-CM | POA: Insufficient documentation

## 2012-03-05 DIAGNOSIS — I4891 Unspecified atrial fibrillation: Secondary | ICD-10-CM | POA: Insufficient documentation

## 2012-03-05 DIAGNOSIS — Z8249 Family history of ischemic heart disease and other diseases of the circulatory system: Secondary | ICD-10-CM | POA: Insufficient documentation

## 2012-03-05 MED ORDER — TECHNETIUM TC 99M SESTAMIBI GENERIC - CARDIOLITE
30.0000 | Freq: Once | INTRAVENOUS | Status: AC | PRN
  Administered 2012-03-05: 30 via INTRAVENOUS

## 2012-03-05 MED ORDER — REGADENOSON 0.4 MG/5ML IV SOLN
0.4000 mg | Freq: Once | INTRAVENOUS | Status: AC
  Administered 2012-03-05: 0.4 mg via INTRAVENOUS

## 2012-03-05 NOTE — Progress Notes (Signed)
[facility] SITE 3 NUCLEAR MED [id number] [place] [address] [phone]  Cardiology Nuclear Med Study  Joyce Terry is a 76 y.o. female     MRN : [id number]     DOB: [birth year]  Procedure Date: 03/05/2012  Nuclear Med Background Indication for Stress Test:  Evaluation for Ischemia and Stent Patency History:  Ablation, AFIB, Cardioversion:multiple CHF, 02/08/11 MPS: Abn small reversible defect anterior apex/ consistant mild ischemia, 02/15/12 Heart Cath: 90%, LAD, EF: 55% -Sten/Angioplasty LAD, 02/03/11 ECHO: EF: 50-60% mild LVH Cardiac Risk Factors: Family History - CAD, History of Smoking, Hypertension, IDDM Type 2, LBBB and Lipids  Symptoms:  Chest Pain, Chest Pain, Chest Pain with Exertion, Chest Pressure, DOE and SOB   Nuclear Pre-Procedure Caffeine/Decaff Intake:  None> 12 hrs NPO After: 8:00am   Lungs:  clear O2 Sat: 95% on room air. IV 0.9% NS with Angio Cath:  20g  IV Site: R Antecubital x 1, tolerated well IV Started by:  Irean Hong, RN  Chest Size (in):  46 Cup Size: D  Height: 5' 8.5" (1.74 m)  Weight:  228 lb (103.42 kg)  BMI:  Body mass index is 34.16 kg/(m^2). Tech Comments:  Took Coreg this am, and 1/2 dose Novolog with breakfast this am    Nuclear Med Study 1 or 2 day study: 2 day  Stress Test Type:  Eugenie Birks  Reading MD: Leodis Sias MD  Order Authorizing Provider:  Kristeen Miss, MD  Resting Radionuclide: Technetium 79m Sestamibi  Resting Radionuclide Dose: 33.0 mCi  03/08/12  Stress Radionuclide:  Technetium 50m Sestamibi  Stress Radionuclide Dose: 33.0 mCi  03/05/12          Stress Protocol Rest HR: 70 Stress HR: 70  Rest BP: 108/52 Stress BP: 127/55  Exercise Time (min): n/a METS: n/a   Predicted Max HR: 146 bpm % Max HR: 47.95 bpm Rate Pressure Product: 8890   Dose of Adenosine (mg):  n/a Dose of Lexiscan: 0.4 mg  Dose of Atropine (mg): n/a Dose of Dobutamine: n/a mcg/kg/min (at max HR)  Stress Test  Technologist: Milana Na, EMT-P  Nuclear Technologist:  Domenic Polite, CNMT     Rest Procedure:  Myocardial perfusion imaging was performed at rest 45 minutes following the intravenous administration of Technetium 69m Sestamibi. Rest ECG: NSR - Normal EKG and Atrial fib with ventricular pacing  Stress Procedure:  The patient received IV Lexiscan 0.4 mg over 15-seconds.  Technetium 51m Tetrofosmin injected at 30-seconds.  There was sob, nausea, chest tightness, and a stiff neck with Lexiscan.  Quantitative spect images were obtained after a 45 minute delay. Stress ECG: No significant change from baseline ECG  QPS Raw Data Images:  Normal; no motion artifact; normal heart/lung ratio. Stress Images:  There is mild apical thinning with normal uptake in other regions. Rest Images:  There is mild apical thinning with normal uptake in other regions. Subtraction (SDS):  No evidence of ischemia. Transient Ischemic Dilatation (Normal <1.22):  0.95 Lung/Heart Ratio (Normal <0.45):  0.25  Quantitative Gated Spect Images QGS EDV:  86 ml QGS ESV:  32 ml  Impression Exercise Capacity:  Lexiscan with no exercise. BP Response:  Normal blood pressure response. Clinical Symptoms:  No significant symptoms noted. ECG Impression:  No significant ST segment change suggestive of ischemia. Comparison with Prior Nuclear Study: No images to compare  Overall Impression:  Low risk stress nuclear study.   SDS = 4.  There is some apical thinning  but no evidence of reversible ischemia.  LV Ejection Fraction: 62%.  LV Wall Motion:  NL LV Function; NL Wall Motion.    Vesta Mixer, Montez Hageman., MD, [facility] 03/08/2012, 4:43 PM Office - [phone] Pager [phone]

## 2012-03-08 ENCOUNTER — Ambulatory Visit (HOSPITAL_COMMUNITY): Payer: Medicare Other | Attending: Internal Medicine

## 2012-03-08 DIAGNOSIS — R0989 Other specified symptoms and signs involving the circulatory and respiratory systems: Secondary | ICD-10-CM

## 2012-03-08 MED ORDER — TECHNETIUM TC 99M SESTAMIBI GENERIC - CARDIOLITE
33.0000 | Freq: Once | INTRAVENOUS | Status: AC | PRN
  Administered 2012-03-08: 33 via INTRAVENOUS

## 2012-03-12 ENCOUNTER — Encounter: Payer: Self-pay | Admitting: Cardiovascular Disease

## 2012-03-12 ENCOUNTER — Other Ambulatory Visit: Payer: Self-pay | Admitting: *Deleted

## 2012-03-12 ENCOUNTER — Ambulatory Visit (INDEPENDENT_AMBULATORY_CARE_PROVIDER_SITE_OTHER): Payer: Medicare Other | Admitting: Cardiovascular Disease

## 2012-03-12 ENCOUNTER — Encounter: Payer: Self-pay | Admitting: *Deleted

## 2012-03-12 VITALS — BP 126/58 | HR 83 | Ht 68.5 in | Wt 231.0 lb

## 2012-03-12 DIAGNOSIS — I509 Heart failure, unspecified: Secondary | ICD-10-CM

## 2012-03-12 DIAGNOSIS — I2510 Atherosclerotic heart disease of native coronary artery without angina pectoris: Secondary | ICD-10-CM

## 2012-03-12 LAB — CBC WITH DIFFERENTIAL/PLATELET
Basophils Relative: 0.3 % (ref 0.0–3.0)
Eosinophils Absolute: 0.1 10*3/uL (ref 0.0–0.7)
Lymphocytes Relative: 14.2 % (ref 12.0–46.0)
MCHC: 32.8 g/dL (ref 30.0–36.0)
Neutrophils Relative %: 77.7 % — ABNORMAL HIGH (ref 43.0–77.0)
Platelets: 330 10*3/uL (ref 150.0–400.0)
RBC: 4.87 Mil/uL (ref 3.87–5.11)
WBC: 8.5 10*3/uL (ref 4.5–10.5)

## 2012-03-12 LAB — BASIC METABOLIC PANEL
CO2: 26 mEq/L (ref 19–32)
Chloride: 100 mEq/L (ref 96–112)
Sodium: 138 mEq/L (ref 135–145)

## 2012-03-12 LAB — PROTIME-INR: INR: 2.7 ratio — ABNORMAL HIGH (ref 0.8–1.0)

## 2012-03-12 MED ORDER — CLOPIDOGREL BISULFATE 75 MG PO TABS: 75.0000 mg | ORAL_TABLET | Freq: Every day | ORAL | Status: DC

## 2012-03-12 NOTE — Patient Instructions (Signed)
Please follow instruction given.

## 2012-03-12 NOTE — Assessment & Plan Note (Addendum)
Joyce Terry is having symptoms c/w unstable angina.Class II-III angina.  ( walking out to the car, walking up hill).   She does not have CP walking in her house.   She also has severe shortness of breath with exertion. Her shortness breath resolved completely after her last stenting in November, 2012.  Her Myoview study revealed apical defect that I originally called apical thinning. I now think that "under called" this defect and suspect that she probably does have a severe apical defect.  Given her symptoms of recurrent  unstable angina, I think we should proceed with cardiac catheterization. She is on Coumadin. We'll hold her Coumadin and anticipate doing a heart catheterization on Friday. I have asked her to eat salads and green vegetables before then. We'll check a protime, basic metabolic profile, and hepatic profile today. We'll check a pro time again on Friday.

## 2012-03-12 NOTE — Progress Notes (Signed)
  Joyce Terry Date of Birth  01/25/1936       [place] [facility]    [place] [facility] [address], [address]  [address], [address] [place], [place]  [address]   [place], [place]  [address] [phone]     [phone]   Fax  [phone]    Fax [phone]  Problem List: 1. Coronary artery disease- s/p PCI of LAD 11/12 2. Status post AV node ablation-Pacemaker 3. Mild congestive heart failure 4. Anemia 5. Atrial fibrillation 6. Hyperlipidemia 7. Diabetes mellitus  History of Present Illness: Joyce Terry is a 75-year-old female with a history of coronary artery disease and atrial fibrillation. She is status post  PTCA and stenting of her mid LAD in November 2012.   She also has a history of a GI bleed with a recent hemoglobin of 8.4. She had her pacer generator changed 2 weeks after her angioplasty/stent. She has not noticed any dark or tarry stools.   She has noticed worsening dyspnea recently  Dec. 16, 2013: Frank he continues to have problems with exertional chest pain and shortness breath. She had a stress Myoview study which revealed an apical defect that I thought was due to apical thinning. Her SDS was 4.  She's been having more and more episodes of exertional chest pain. She had to take nitroglycerin last night. The pains are very similar to her previous episodes of angina last year but she states that it is not as severe yet.  She thinks that the pain is worse with cold weather.    Current Outpatient Prescriptions on File Prior to Visit  Medication Sig Dispense Refill  . ALPRAZolam (XANAX) 0.25 MG tablet Take 0.125 mg by mouth at bedtime as needed. For anxiety       . carvedilol (COREG) 3.125 MG tablet take 1 tablet twice a day  60 tablet  5  . clopidogrel (PLAVIX) 75 MG tablet take 1 tablet by mouth once daily with BREAKFAST  30 tablet  6  . escitalopram (LEXAPRO) 10 MG tablet Take 10 mg by mouth daily.        . esomeprazole (NEXIUM) 40 MG capsule Take 40 mg  by mouth daily before breakfast.        . ezetimibe-simvastatin (VYTORIN) 10-20 MG per tablet Take 1 tablet by mouth at bedtime.       . fentaNYL (DURAGESIC) 100 MCG/HR Place 1 patch onto the skin every 3 (three) days. 1 patch 50mcg daily      . Fluticasone-Salmeterol (ADVAIR) 250-50 MCG/DOSE AEPB Inhale 1 puff into the lungs every 12 (twelve) hours as needed. For shortness of breath       . glyBURIDE-metformin (GLUCOVANCE) 5-500 MG per tablet Take 2 tablets by mouth 2 (two) times daily with a meal.       . insulin aspart (NOVOLOG) 100 UNIT/ML injection Inject 4-8 Units into the skin 2 (two) times daily. If blood sugar>250, use 7-8 units. If if <200, use 4-5 units       . insulin glargine (LANTUS) 100 UNIT/ML injection Inject 30 Units into the skin at bedtime.       . losartan (COZAAR) 25 MG tablet Take 1 tablet (25 mg total) by mouth daily.  30 tablet  11  . nitroGLYCERIN (NITROSTAT) 0.4 MG SL tablet Place 1 tablet (0.4 mg total) under the tongue every 5 (five) minutes as needed for chest pain.  25 tablet  3  . OVER THE COUNTER MEDICATION Place 1-2   drops into both eyes daily as needed. For dry/itchy eyes --saline drops       . oxyCODONE-acetaminophen (PERCOCET) 10-325 MG per tablet Take 1 tablet by mouth every 4 (four) hours as needed. For pain       . torsemide (DEMADEX) 20 MG tablet take 1 tablet by mouth once daily  30 tablet  5  . warfarin (COUMADIN) 5 MG tablet Take 2.5-5 mg by mouth daily. Take 2.5 mg tues,and fri, take 5 mg all other days       . zolpidem (AMBIEN) 10 MG tablet Take 5 mg by mouth at bedtime as needed. For sleep        Allergies  Allergen Reactions  . Codeine Nausea And Vomiting  . Demerol Nausea And Vomiting  . Morphine And Related Itching  . Penicillins Hives  . Tape     Past Medical History  Diagnosis Date  . CHF (congestive heart failure)     chronic systolic CHF  . Hypertension   . Atrial fibrillation   . Obesity   . Coronary artery disease     cardiac  catheterization in 1997 -- Est. EF of 50%  . Fatigue   . Stress   . Chest pain   . Shortness of breath   . Peptic ulcer 1985  . Angina   . Anemia   . Cancer     ENDOMETRIAL  . GERD (gastroesophageal reflux disease)   . Depression   . Sinoatrial node dysfunction   . Gastric polyps   . Colon polyps   . Diverticulosis     Past Surgical History  Procedure Date  . Cardiac catheterization 02/21/96    Est. EF of 50%  . Vesicovaginal fistula closure w/ tah 1995  . Abdominal hysterectomy   . Tonsillectomy   . Joint replacement     LEFT HIP   02/2008  . Coronary angioplasty with stent placement 02/15/11    "one"  . Cholecystectomy fall 2001  . Insert / replace / remove pacemaker 07/23/99    atrial insertion of the AV node -- Medtronic single chamber pacemaker   . Dilation and curettage of uterus     "had some when I was 18-22"    History  Smoking status  . Former Smoker -- 1.0 packs/day for 35 years  . Types: Cigarettes  Smokeless tobacco  . Never Used    Comment: quit smoking "11/1989"    History  Alcohol Use  . Yes    Comment: "glass of wine q once in awhile"    Family History  Problem Relation Age of Onset  . Heart disease Mother   . Hypertension Mother     Reviw of Systems:  Reviewed in the HPI.  All other systems are negative.  Physical Exam: Blood pressure 126/58, pulse 83, height 5' 8.5" (1.74 m), weight 231 lb (104.781 kg), SpO2 94.00%. General: Well developed, well nourished, in no acute distress.  Head: Normocephalic, atraumatic, sclera non-icteric, mucus membranes are moist,   Neck: Supple. Carotids are 2 + without bruits. No JVD  Lungs: Clear bilaterally to auscultation.  Heart: regular rate.  normal  S1 S2. No murmurs, gallops or rubs.  Abdomen: Soft, non-tender, non-distended with normal bowel sounds. No hepatomegaly. No rebound/guarding. No masses.  Msk:  Strength and tone are normal  Extremities: No clubbing or cyanosis. No edema.   Distal pedal pulses are 2+ and equal bilaterally.  Neuro: Alert and oriented X 3. Moves all extremities spontaneously.  Psych:    Responds to questions appropriately with a normal affect.  ECG:  Assessment / Plan:   

## 2012-03-12 NOTE — Assessment & Plan Note (Signed)
She now once again has symptoms of congestive heart failure. I suspect this is an angina equivalent .  Her ejection fraction is 62%.

## 2012-03-12 NOTE — Telephone Encounter (Signed)
Fax Received. Refill Completed. Joyce Terry (R.M.A)   

## 2012-03-16 ENCOUNTER — Ambulatory Visit (HOSPITAL_COMMUNITY)
Admission: RE | Admit: 2012-03-16 | Discharge: 2012-03-17 | Disposition: A | Discharge status: Home / Self Care | Payer: Medicare Other | Source: Ambulatory Visit | Attending: Cardiovascular Disease | Admitting: Cardiovascular Disease

## 2012-03-16 ENCOUNTER — Encounter (HOSPITAL_COMMUNITY)
Admission: RE | Disposition: A | Discharge status: Home / Self Care | Payer: Self-pay | Source: Ambulatory Visit | Attending: Cardiovascular Disease

## 2012-03-16 ENCOUNTER — Ambulatory Visit (HOSPITAL_COMMUNITY): Admit: 2012-03-16 | Payer: Self-pay | Admitting: Cardiovascular Disease

## 2012-03-16 ENCOUNTER — Encounter (HOSPITAL_COMMUNITY): Payer: Self-pay | Admitting: General Practice

## 2012-03-16 DIAGNOSIS — I509 Heart failure, unspecified: Secondary | ICD-10-CM | POA: Insufficient documentation

## 2012-03-16 DIAGNOSIS — E785 Hyperlipidemia, unspecified: Secondary | ICD-10-CM | POA: Insufficient documentation

## 2012-03-16 DIAGNOSIS — I5032 Chronic diastolic (congestive) heart failure: Secondary | ICD-10-CM | POA: Diagnosis present

## 2012-03-16 DIAGNOSIS — I4891 Unspecified atrial fibrillation: Secondary | ICD-10-CM | POA: Insufficient documentation

## 2012-03-16 DIAGNOSIS — Z7901 Long term (current) use of anticoagulants: Secondary | ICD-10-CM | POA: Insufficient documentation

## 2012-03-16 DIAGNOSIS — I209 Angina pectoris, unspecified: Secondary | ICD-10-CM

## 2012-03-16 DIAGNOSIS — Z9861 Coronary angioplasty status: Secondary | ICD-10-CM | POA: Insufficient documentation

## 2012-03-16 DIAGNOSIS — I2510 Atherosclerotic heart disease of native coronary artery without angina pectoris: Secondary | ICD-10-CM

## 2012-03-16 DIAGNOSIS — D649 Anemia, unspecified: Secondary | ICD-10-CM | POA: Insufficient documentation

## 2012-03-16 DIAGNOSIS — E119 Type 2 diabetes mellitus without complications: Secondary | ICD-10-CM | POA: Insufficient documentation

## 2012-03-16 DIAGNOSIS — Z79899 Other long term (current) drug therapy: Secondary | ICD-10-CM | POA: Insufficient documentation

## 2012-03-16 DIAGNOSIS — Z950 Presence of cardiac pacemaker: Secondary | ICD-10-CM | POA: Insufficient documentation

## 2012-03-16 DIAGNOSIS — I200 Unstable angina: Secondary | ICD-10-CM | POA: Insufficient documentation

## 2012-03-16 HISTORY — DX: Dyspnea, unspecified: R06.00

## 2012-03-16 HISTORY — DX: Other forms of dyspnea: R06.09

## 2012-03-16 HISTORY — PX: CORONARY ANGIOPLASTY: SHX604

## 2012-03-16 HISTORY — DX: Other hemorrhoids: K64.8

## 2012-03-16 HISTORY — DX: Migraine, unspecified, not intractable, without status migrainosus: G43.909

## 2012-03-16 HISTORY — DX: Acute embolism and thrombosis of unspecified deep veins of unspecified lower extremity: I82.409

## 2012-03-16 HISTORY — DX: Pure hypercholesterolemia, unspecified: E78.00

## 2012-03-16 HISTORY — DX: Malignant neoplasm of endometrium: C54.1

## 2012-03-16 HISTORY — PX: LEFT HEART CATHETERIZATION WITH CORONARY ANGIOGRAM: SHX5451

## 2012-03-16 HISTORY — DX: Dorsalgia, unspecified: M54.9

## 2012-03-16 HISTORY — DX: Unspecified osteoarthritis, unspecified site: M19.90

## 2012-03-16 HISTORY — DX: Pneumonia, unspecified organism: J18.9

## 2012-03-16 HISTORY — DX: Type 2 diabetes mellitus without complications: E11.9

## 2012-03-16 HISTORY — DX: Other chronic pain: G89.29

## 2012-03-16 HISTORY — PX: PERCUTANEOUS CORONARY STENT INTERVENTION (PCI-S): SHX5485

## 2012-03-16 SURGERY — LEFT HEART CATHETERIZATION WITH CORONARY ANGIOGRAM
Anesthesia: LOCAL

## 2012-03-16 SURGERY — PERCUTANEOUS CORONARY STENT INTERVENTION (PCI-S)
Anesthesia: LOCAL

## 2012-03-16 MED ORDER — NITROGLYCERIN 0.4 MG SL SUBL: 0.4000 mg | SUBLINGUAL_TABLET | SUBLINGUAL | Status: DC | PRN

## 2012-03-16 MED ORDER — LIDOCAINE HCL (PF) 1 % IJ SOLN
INTRAMUSCULAR | Status: AC
  Filled 2012-03-16: qty 30

## 2012-03-16 MED ORDER — SODIUM CHLORIDE 0.9 % IJ SOLN
3.0000 mL | Freq: Two times a day (BID) | INTRAMUSCULAR | Status: DC
  Administered 2012-03-16 (×2): 3 mL via INTRAVENOUS

## 2012-03-16 MED ORDER — NITROGLYCERIN 0.2 MG/ML ON CALL CATH LAB
INTRAVENOUS | Status: AC
  Filled 2012-03-16: qty 1

## 2012-03-16 MED ORDER — ASPIRIN 81 MG PO CHEW
324.0000 mg | CHEWABLE_TABLET | ORAL | Status: AC
  Administered 2012-03-16: 324 mg via ORAL

## 2012-03-16 MED ORDER — SODIUM CHLORIDE 0.9 % IJ SOLN: 3.0000 mL | Freq: Two times a day (BID) | INTRAMUSCULAR | Status: DC

## 2012-03-16 MED ORDER — ALPRAZOLAM 0.25 MG PO TABS: 0.1250 mg | ORAL_TABLET | Freq: Every evening | ORAL | Status: DC | PRN

## 2012-03-16 MED ORDER — LOSARTAN POTASSIUM 25 MG PO TABS
25.0000 mg | ORAL_TABLET | Freq: Every day | ORAL | Status: DC
  Administered 2012-03-17: 11:00:00 25 mg via ORAL
  Filled 2012-03-16 (×2): qty 1

## 2012-03-16 MED ORDER — HYDRALAZINE HCL 20 MG/ML IJ SOLN
10.0000 mg | INTRAMUSCULAR | Status: DC | PRN
  Filled 2012-03-16: qty 0.5

## 2012-03-16 MED ORDER — HEPARIN (PORCINE) IN NACL 2-0.9 UNIT/ML-% IJ SOLN
INTRAMUSCULAR | Status: AC
  Filled 2012-03-16: qty 1000

## 2012-03-16 MED ORDER — HEPARIN (PORCINE) IN NACL 100-0.45 UNIT/ML-% IJ SOLN
1100.0000 [IU]/h | INTRAMUSCULAR | Status: DC
  Administered 2012-03-16: 1100 [IU]/h via INTRAVENOUS
  Filled 2012-03-16: qty 250

## 2012-03-16 MED ORDER — SODIUM CHLORIDE 0.9 % IV SOLN: 250.0000 mL | INTRAVENOUS | Status: DC | PRN

## 2012-03-16 MED ORDER — FENTANYL CITRATE 0.05 MG/ML IJ SOLN
INTRAMUSCULAR | Status: AC
  Filled 2012-03-16: qty 2

## 2012-03-16 MED ORDER — FENTANYL 50 MCG/HR TD PT72: 50.0000 ug | MEDICATED_PATCH | TRANSDERMAL | Status: DC

## 2012-03-16 MED ORDER — CLOPIDOGREL BISULFATE 75 MG PO TABS
75.0000 mg | ORAL_TABLET | Freq: Every day | ORAL | Status: DC
  Administered 2012-03-17: 75 mg via ORAL
  Filled 2012-03-16: qty 1

## 2012-03-16 MED ORDER — WHITE PETROLATUM GEL
Status: AC
  Filled 2012-03-16: qty 5

## 2012-03-16 MED ORDER — ASPIRIN 81 MG PO CHEW
CHEWABLE_TABLET | ORAL | Status: AC
  Filled 2012-03-16: qty 4

## 2012-03-16 MED ORDER — OXYCODONE-ACETAMINOPHEN 5-325 MG PO TABS
1.0000 | ORAL_TABLET | ORAL | Status: DC | PRN
  Administered 2012-03-16 – 2012-03-17 (×3): 1 via ORAL
  Filled 2012-03-16 (×3): qty 1

## 2012-03-16 MED ORDER — MIDAZOLAM HCL 2 MG/2ML IJ SOLN
INTRAMUSCULAR | Status: AC
  Filled 2012-03-16: qty 2

## 2012-03-16 MED ORDER — SODIUM CHLORIDE 0.9 % IJ SOLN: 3.0000 mL | INTRAMUSCULAR | Status: DC | PRN

## 2012-03-16 MED ORDER — ACETAMINOPHEN 325 MG PO TABS: 650.0000 mg | ORAL_TABLET | ORAL | Status: DC | PRN

## 2012-03-16 MED ORDER — MIDAZOLAM HCL 2 MG/2ML IJ SOLN: 2.0000 mg | INTRAMUSCULAR | Status: DC | PRN

## 2012-03-16 MED ORDER — MOMETASONE FURO-FORMOTEROL FUM 100-5 MCG/ACT IN AERO
2.0000 | INHALATION_SPRAY | Freq: Two times a day (BID) | RESPIRATORY_TRACT | Status: DC
  Administered 2012-03-16: 2 via RESPIRATORY_TRACT
  Filled 2012-03-16 (×2): qty 8.8

## 2012-03-16 MED ORDER — MOMETASONE FURO-FORMOTEROL FUM 100-5 MCG/ACT IN AERO
2.0000 | INHALATION_SPRAY | Freq: Two times a day (BID) | RESPIRATORY_TRACT | Status: DC
  Filled 2012-03-16: qty 8.8

## 2012-03-16 MED ORDER — ESCITALOPRAM OXALATE 10 MG PO TABS
10.0000 mg | ORAL_TABLET | Freq: Every day | ORAL | Status: DC
  Administered 2012-03-17: 11:00:00 10 mg via ORAL
  Filled 2012-03-16 (×2): qty 1

## 2012-03-16 MED ORDER — ASPIRIN EC 81 MG PO TBEC
81.0000 mg | DELAYED_RELEASE_TABLET | Freq: Every day | ORAL | Status: DC
  Administered 2012-03-17: 11:00:00 81 mg via ORAL
  Filled 2012-03-16: qty 1

## 2012-03-16 MED ORDER — ZOLPIDEM TARTRATE 5 MG PO TABS: 5.0000 mg | ORAL_TABLET | Freq: Every evening | ORAL | Status: DC | PRN

## 2012-03-16 MED ORDER — SODIUM CHLORIDE 0.9 % IV SOLN
INTRAVENOUS | Status: AC
  Administered 2012-03-16: 20:00:00 via INTRAVENOUS

## 2012-03-16 MED ORDER — INSULIN GLARGINE 100 UNIT/ML ~~LOC~~ SOLN: 30.0000 [IU] | Freq: Every day | SUBCUTANEOUS | Status: DC

## 2012-03-16 MED ORDER — HEPARIN SODIUM (PORCINE) 1000 UNIT/ML IJ SOLN
INTRAMUSCULAR | Status: AC
  Filled 2012-03-16: qty 1

## 2012-03-16 MED ORDER — FENTANYL CITRATE 0.05 MG/ML IJ SOLN
50.0000 ug | Freq: Once | INTRAMUSCULAR | Status: AC
  Administered 2012-03-16: 16:00:00 50 ug via INTRAVENOUS

## 2012-03-16 MED ORDER — INSULIN ASPART 100 UNIT/ML ~~LOC~~ SOLN
0.0000 [IU] | Freq: Three times a day (TID) | SUBCUTANEOUS | Status: DC
  Administered 2012-03-17: 3 [IU] via SUBCUTANEOUS

## 2012-03-16 MED ORDER — OXYCODONE-ACETAMINOPHEN 10-325 MG PO TABS: 1.0000 | ORAL_TABLET | ORAL | Status: DC | PRN

## 2012-03-16 MED ORDER — FENTANYL CITRATE 0.05 MG/ML IJ SOLN
50.0000 ug | INTRAMUSCULAR | Status: DC | PRN
  Administered 2012-03-16: 20:00:00 50 ug via INTRAVENOUS
  Filled 2012-03-16: qty 2

## 2012-03-16 MED ORDER — ONDANSETRON HCL 4 MG/2ML IJ SOLN: 4.0000 mg | Freq: Four times a day (QID) | INTRAMUSCULAR | Status: DC | PRN

## 2012-03-16 MED ORDER — PANTOPRAZOLE SODIUM 40 MG PO TBEC
40.0000 mg | DELAYED_RELEASE_TABLET | Freq: Every day | ORAL | Status: DC
  Administered 2012-03-17: 11:00:00 40 mg via ORAL
  Filled 2012-03-16: qty 1

## 2012-03-16 MED ORDER — BIVALIRUDIN 250 MG IV SOLR
INTRAVENOUS | Status: AC
  Filled 2012-03-16: qty 250

## 2012-03-16 MED ORDER — ASPIRIN 81 MG PO CHEW: 324.0000 mg | CHEWABLE_TABLET | ORAL | Status: DC

## 2012-03-16 MED ORDER — OXYCODONE HCL 5 MG PO TABS
5.0000 mg | ORAL_TABLET | ORAL | Status: DC | PRN
  Administered 2012-03-16 – 2012-03-17 (×4): 5 mg via ORAL
  Filled 2012-03-16 (×4): qty 1

## 2012-03-16 MED ORDER — EZETIMIBE-SIMVASTATIN 10-20 MG PO TABS
1.0000 | ORAL_TABLET | Freq: Every day | ORAL | Status: DC
  Administered 2012-03-16: 22:00:00 1 via ORAL
  Filled 2012-03-16 (×2): qty 1

## 2012-03-16 MED ORDER — TORSEMIDE 20 MG PO TABS
20.0000 mg | ORAL_TABLET | Freq: Every day | ORAL | Status: DC
  Administered 2012-03-16: 20 mg via ORAL
  Filled 2012-03-16 (×2): qty 1

## 2012-03-16 MED ORDER — CARVEDILOL 3.125 MG PO TABS
3.1250 mg | ORAL_TABLET | Freq: Two times a day (BID) | ORAL | Status: DC
  Administered 2012-03-17: 3.125 mg via ORAL
  Filled 2012-03-16 (×4): qty 1

## 2012-03-16 MED ORDER — SODIUM CHLORIDE 0.9 % IV SOLN: INTRAVENOUS | Status: DC

## 2012-03-16 MED ORDER — SODIUM CHLORIDE 0.9 % IV SOLN: 1.0000 mL/kg/h | INTRAVENOUS | Status: DC

## 2012-03-16 NOTE — Interval H&P Note (Signed)
History and Physical Interval Note:  03/16/2012 6:53 AM  Joyce Terry  has presented today for surgery, with the diagnosis of Chest pain  The various methods of treatment have been discussed with the patient and family. After consideration of risks, benefits and other options for treatment, the patient has consented to  Procedure(s) (LRB) with comments: LEFT HEART CATHETERIZATION WITH CORONARY ANGIOGRAM (N/A) as a surgical intervention .  The patient's history has been reviewed, patient examined, no change in status, stable for surgery.  I have reviewed the patient's chart and labs.  Questions were answered to the patient's satisfaction.     Elyn Aquas.

## 2012-03-16 NOTE — Interval H&P Note (Signed)
History and Physical Interval Note:  03/16/2012 4:48 PM  Joyce Terry  has presented today for PCI with  the diagnosis of CAD.  The various methods of treatment have been discussed with the patient and family. After consideration of risks, benefits and other options for treatment, the patient has consented to  Procedure(s) (LRB) with comments: PERCUTANEOUS CORONARY STENT INTERVENTION (PCI-S) (N/A) as a surgical intervention .  The patient's history has been reviewed, patient examined, no change in status, stable for surgery.  I have reviewed the patient's chart and labs.  Questions were answered to the patient's satisfaction.     MCALHANY,CHRISTOPHER

## 2012-03-16 NOTE — Progress Notes (Signed)
ANTICOAGULATION CONSULT NOTE - Initial Consult  Pharmacy Consult for heparin Indication: chest pain/ACS  Allergies  Allergen Reactions  . Codeine Nausea And Vomiting  . Demerol Nausea And Vomiting  . Morphine And Related Itching  . Penicillins Hives  . Tape     Patient Measurements: Height: 5' 8.5" (174 cm) Weight: 228 lb (103.42 kg) IBW/kg (Calculated) : 65.05  Heparin Dosing Weight: 88 kg  Vital Signs: Temp: 97.5 F (36.4 C) (12/20 1200) Temp src: Oral (12/20 1200) BP: 105/85 mmHg (12/20 1200) Pulse Rate: 70  (12/20 1200)  Labs:  Basename 03/16/12 0600  HGB --  HCT --  PLT --  APTT --  LABPROT 14.3  INR 1.13  HEPARINUNFRC --  CREATININE --  CKTOTAL --  CKMB --  TROPONINI --    Estimated Creatinine Clearance: 52.2 ml/min (by C-G formula based on Cr of 1.2).   Medical History: Past Medical History  Diagnosis Date  . CHF (congestive heart failure)     chronic systolic CHF  . Hypertension   . Atrial fibrillation   . Obesity   . Coronary artery disease     cardiac catheterization in 1997 -- Est. EF of 50%  . Fatigue   . Stress   . Chest pain   . Shortness of breath   . Peptic ulcer 1985  . Angina   . Anemia   . Cancer     ENDOMETRIAL  . GERD (gastroesophageal reflux disease)   . Depression   . Sinoatrial node dysfunction   . Gastric polyps   . Colon polyps   . Diverticulosis     Medications:  Prescriptions prior to admission  Medication Sig Dispense Refill  . ALPRAZolam (XANAX) 0.25 MG tablet Take 0.125 mg by mouth at bedtime as needed. For anxiety       . carvedilol (COREG) 3.125 MG tablet take 1 tablet twice a day  60 tablet  5  . clopidogrel (PLAVIX) 75 MG tablet Take 1 tablet (75 mg total) by mouth daily.  30 tablet  6  . escitalopram (LEXAPRO) 10 MG tablet Take 10 mg by mouth daily.        Marland Kitchen esomeprazole (NEXIUM) 40 MG capsule Take 40 mg by mouth daily before breakfast.        . ezetimibe-simvastatin (VYTORIN) 10-20 MG per tablet Take  1 tablet by mouth at bedtime.       . fentaNYL (DURAGESIC - DOSED MCG/HR) 50 MCG/HR Place 1 patch onto the skin every 3 (three) days.      . Fluticasone-Salmeterol (ADVAIR) 250-50 MCG/DOSE AEPB Inhale 1 puff into the lungs every 12 (twelve) hours as needed. For shortness of breath       . glyBURIDE-metformin (GLUCOVANCE) 5-500 MG per tablet Take 2 tablets by mouth 2 (two) times daily with a meal.       . insulin aspart (NOVOLOG) 100 UNIT/ML injection Inject 4-8 Units into the skin 2 (two) times daily. If blood sugar>250, use 7-8 units. If if <200, use 4-5 units       . insulin glargine (LANTUS) 100 UNIT/ML injection Inject 30 Units into the skin at bedtime.       Marland Kitchen losartan (COZAAR) 25 MG tablet Take 1 tablet (25 mg total) by mouth daily.  30 tablet  11  . OVER THE COUNTER MEDICATION Place 1-2 drops into both eyes daily as needed. For dry/itchy eyes --saline drops       . oxyCODONE-acetaminophen (PERCOCET) 10-325 MG per tablet Take  1 tablet by mouth every 4 (four) hours as needed. For pain       . torsemide (DEMADEX) 20 MG tablet take 1 tablet by mouth once daily  30 tablet  5  . zolpidem (AMBIEN) 10 MG tablet Take 5 mg by mouth at bedtime as needed. For sleep      . nitroGLYCERIN (NITROSTAT) 0.4 MG SL tablet Place 1 tablet (0.4 mg total) under the tongue every 5 (five) minutes as needed for chest pain.  25 tablet  3  . warfarin (COUMADIN) 5 MG tablet Take 2.5-5 mg by mouth daily. Take 2.5 mg tues,and fri, take 5 mg all other days         Assessment: 89 y/oF admitted with chest pain. Went to cath this morning, starting heparin per pharmacy. Sheath still in place, confirmed to start heparin now.  Going back to cath.   Goal of Therapy:  Heparin level 0.3-0.7 units/ml Monitor platelets by anticoagulation protocol: Yes   Plan:  1. Start heparin at 1100 units/hr 2. Follow up after repeat cath    Doris Cheadle, PharmD Clinical Pharmacist Pager: [phone] Phone: [phone] 03/16/2012  1:03 PM

## 2012-03-16 NOTE — Progress Notes (Signed)
HYPOGLYCEMIC EVENT  CBG: 62  Treatment: 15 GM carbohydrate snack  Symptoms: None  Follow-up CBG: Time:1846 CBG Result:96  Possible Reasons for Event: Inadequate meal intake and Other: post pci   Comments/MD notified:dr mcalhany notified

## 2012-03-16 NOTE — Progress Notes (Signed)
Site area: right groin  Site Prior to Removal:  Level 0  Pressure Applied For 20 MINUTES    Minutes Beginning at 20:30  Manual:   yes  Patient Status During Pull:  Alert, oriented  Post Pull Groin Site:  Level 0  Post Pull Instructions Given:  yes  Post Pull Pulses Present:  yes  Dressing Applied:  yes  Comments:  Pt tolerated sheath removal well.

## 2012-03-16 NOTE — H&P (View-Only) (Signed)
Joyce Terry Date of Birth  11/12/1935       [facility]    [place] [address]  [address], [address] [place], [place]  [address]   [place], [place]  [address] [phone]     [phone]   Fax  [phone]    Fax [phone]  Problem List: 1. Coronary artery disease- s/p PCI of LAD 11/12 2. Status post AV node ablation-Pacemaker 3. Mild congestive heart failure 4. Anemia 5. Atrial fibrillation 6. Hyperlipidemia 7. Diabetes mellitus  History of Present Illness: Joyce Terry is a 76 year old female with a history of coronary artery disease and atrial fibrillation. She is status post  PTCA and stenting of her mid LAD in November 2012.   She also has a history of a GI bleed with a recent hemoglobin of 8.4. She had her pacer generator changed 2 weeks after her angioplasty/stent. She has not noticed any dark or tarry stools.   She has noticed worsening dyspnea recently  Dec. 16, 2013: [name] he continues to have problems with exertional chest pain and shortness breath. She had a stress Myoview study which revealed an apical defect that I thought was due to apical thinning. Her SDS was 4.  She's been having more and more episodes of exertional chest pain. She had to take nitroglycerin last night. The pains are very similar to her previous episodes of angina last year but she states that it is not as severe yet.  She thinks that the pain is worse with cold weather.    Current Outpatient Prescriptions on File Prior to Visit  Medication Sig Dispense Refill  . ALPRAZolam (XANAX) 0.25 MG tablet Take 0.125 mg by mouth at bedtime as needed. For anxiety       . carvedilol (COREG) 3.125 MG tablet take 1 tablet twice a day  60 tablet  5  . clopidogrel (PLAVIX) 75 MG tablet take 1 tablet by mouth once daily with BREAKFAST  30 tablet  6  . escitalopram (LEXAPRO) 10 MG tablet Take 10 mg by mouth daily.        Marland Kitchen esomeprazole (NEXIUM) 40 MG capsule Take 40 mg  by mouth daily before breakfast.        . ezetimibe-simvastatin (VYTORIN) 10-20 MG per tablet Take 1 tablet by mouth at bedtime.       . fentaNYL (DURAGESIC) 100 MCG/HR Place 1 patch onto the skin every 3 (three) days. 1 patch daily      . Fluticasone-Salmeterol (ADVAIR) 250-50 MCG/DOSE AEPB Inhale 1 puff into the lungs every 12 (twelve) hours as needed. For shortness of breath       . glyBURIDE-metformin (GLUCOVANCE) 5-500 MG per tablet Take 2 tablets by mouth 2 (two) times daily with a meal.       . insulin aspart (NOVOLOG) 100 UNIT/ML injection Inject 4-8 Units into the skin 2 (two) times daily. If blood sugar>250, use 7-8 units. If if <200, use 4-5 units       . insulin glargine (LANTUS) 100 UNIT/ML injection Inject 30 Units into the skin at bedtime.       Marland Kitchen losartan (COZAAR) 25 MG tablet Take 1 tablet (25 mg total) by mouth daily.  30 tablet  11  . nitroGLYCERIN (NITROSTAT) 0.4 MG SL tablet Place 1 tablet (0.4 mg total) under the tongue every 5 (five) minutes as needed for chest pain.  25 tablet  3  . OVER THE COUNTER MEDICATION Place 1-2  drops into both eyes daily as needed. For dry/itchy eyes --saline drops       . oxyCODONE-acetaminophen (PERCOCET) 10-325 MG per tablet Take 1 tablet by mouth every 4 (four) hours as needed. For pain       . torsemide (DEMADEX) 20 MG tablet take 1 tablet by mouth once daily  30 tablet  5  . warfarin (COUMADIN) 5 MG tablet Take 2.5-5 mg by mouth daily. Take 2.5 mg tues,and fri, take 5 mg all other days       . zolpidem (AMBIEN) 10 MG tablet Take 5 mg by mouth at bedtime as needed. For sleep        Allergies  Allergen Reactions  . Codeine Nausea And Vomiting  . Demerol Nausea And Vomiting  . Morphine And Related Itching  . Penicillins Hives  . Tape     Past Medical History  Diagnosis Date  . CHF (congestive heart failure)     chronic systolic CHF  . Hypertension   . Atrial fibrillation   . Obesity   . Coronary artery disease     cardiac  catheterization in 1997 -- Est. EF of 50%  . Fatigue   . Stress   . Chest pain   . Shortness of breath   . Peptic ulcer 1985  . Angina   . Anemia   . Cancer     ENDOMETRIAL  . GERD (gastroesophageal reflux disease)   . Depression   . Sinoatrial node dysfunction   . Gastric polyps   . Colon polyps   . Diverticulosis     Past Surgical History  Procedure Date  . Cardiac catheterization 02/21/96    Est. EF of 50%  . Vesicovaginal fistula closure w/ tah 1995  . Abdominal hysterectomy   . Tonsillectomy   . Joint replacement     LEFT HIP   02/2008  . Coronary angioplasty with stent placement 02/15/11    "one"  . Cholecystectomy fall 2001  . Insert / replace / remove pacemaker 07/23/99    atrial insertion of the AV node -- Medtronic single chamber pacemaker   . Dilation and curettage of uterus     "had some when I was 18-22"    History  Smoking status  . Former Smoker -- 1.0 packs/day for 35 years  . Types: Cigarettes  Smokeless tobacco  . Never Used    Comment: quit smoking "11/1989"    History  Alcohol Use  . Yes    Comment: "glass of wine q once in awhile"    Family History  Problem Relation Age of Onset  . Heart disease Mother   . Hypertension Mother     Reviw of Systems:  Reviewed in the HPI.  All other systems are negative.  Physical Exam: Blood pressure 126/58, pulse 83, height 5' 8.5" (1.74 m), weight 231 lb (104.781 kg), SpO2 94.00%. General: Well developed, well nourished, in no acute distress.  Head: Normocephalic, atraumatic, sclera non-icteric, mucus membranes are moist,   Neck: Supple. Carotids are 2 + without bruits. No JVD  Lungs: Clear bilaterally to auscultation.  Heart: regular rate.  normal  S1 S2. No murmurs, gallops or rubs.  Abdomen: Soft, non-tender, non-distended with normal bowel sounds. No hepatomegaly. No rebound/guarding. No masses.  Msk:  Strength and tone are normal  Extremities: No clubbing or cyanosis. No edema.   Distal pedal pulses are 2+ and equal bilaterally.  Neuro: Alert and oriented X 3. Moves all extremities spontaneously.  Psych:  Responds to questions appropriately with a normal affect.  ECG:  Assessment / Plan:

## 2012-03-16 NOTE — CV Procedure (Signed)
   Cardiac Catheterization Operative Report  Joyce Terry [id number] 12/20/20135:21 PM ARONSON,RICHARD A, MD  Procedure Performed:  1. PTCA of the diagonal branch with balloon angioplasty only.    Operator: Verne Carrow, MD  Indication:  76 yo female with history of CAD s/p LAD stent in 2012 with jailing of diagonal branch. The patient has had angina that was not responsive to maximal medical therapy. Diagnostic cath this am per Dr. Elease Hashimoto.                             Procedure Details: The risks, benefits, complications, treatment options, and expected outcomes were discussed with the patient before the diagnostic procedure and reviewed again before the PCI. The patient and/or family concurred with the proposed plan, giving informed consent. The patient was brought from holding area with a 5 French sheath in the right femoral artery. The right groin was prepped and draped in a sterile fashion. The sheath was exchanged for a 6 french sheath. She was given a bolus of Angiomax and a drip was started. A XB LAD 3.5 guiding catheter was used to engage the left main artery. Angiography of the left system was performed. When the ACT was greater than 200, I passed a BMW wire into the Diagonal branch. I then placed a second BMW wire down the LAD. I then used a 2.0 x 10 mm balloon to dilate the ostium of the diagonal branch in the area of tightest stenosis. I then used a 2.5 x 10 mm balloon to dilate the stenosis. The stenosis was taken from 90% down to 30%. The patient was sedated with Versed and Fentanyl before the case started.   There were no immediate complications. The patient was taken to the recovery area in stable condition.   Hemodynamic Findings: Central aortic pressure: 149/70  Impression: 1. Severe stenosis ostium of diagonal branch (jailed by LAD stent) 2. Successful balloon angioplasty of the diagonal branch.   Recommendations: She will need dual antiplatelet therapy with  ASA and Plavix. Continue other cardiac meds. D/C home in am if stable.        Complications:  None; patient tolerated the procedure well.

## 2012-03-16 NOTE — CV Procedure (Signed)
    Cardiac Cath Note  Joyce Terry [id number] 1935/08/30  Procedure: left  Heart Cardiac Catheterization Note Indications: progressive unstable angina  Procedure Details Consent: Obtained Time Out: Verified patient identification, verified procedure, site/side was marked, verified correct patient position, special equipment/implants available, Radiology Safety Procedures followed,  medications/allergies/relevent history reviewed, required imaging and test results available.  Performed   Medications: Fentanyl: 50 mcg IV Versed: 2 mg IV  The right femoral artery was easily canulated using a modified Seldinger technique.  Hemodynamics:   LV pressure: 136/11 Aortic pressure: 134/61  Angiography   Left Main: normal  Left anterior Descending:  Minor luminal irreg prox.  There is a stent in the proximal LAD that crosses the 1st diag.  The stent has no significant ISR.  The 1st diag is a large branch (larger than the LAD ) and has an 80-90 % stenosis at the take off.  The distal LAD has minor luminal irregularities.   Left Circumflex: minor luminal irreg  Right Coronary Artery: large and dom.  Minor luminal irreg.  LV Gram:  Not done to minimize contrast.  Crossed for pressures  Complications: No apparent complications Patient did tolerate procedure well.  Contrast used: 55 cc  Conclusions:  Tight stenosis in the 1st diagonal.  She is very symptomatic - class III angina.  Will ask Dr. Sanjuana Kava to review films and consider PCI of diagonal  Alvia Grove., MD, [facility] 03/16/2012, 8:10 AM Office - [phone] Pager [phone]

## 2012-03-16 NOTE — H&P (Signed)
Joyce Terry  Date of Birth [birth year]  [facility]  [address], [address], [address]  [place], [place] [address] [place], [place] [address]  [phone]  Fax [phone] Fax [phone]  Problem List:  1. Coronary artery disease- s/p PCI of LAD 11/12  2. Status post AV node ablation-Pacemaker  3. Mild congestive heart failure  4. Anemia  5. Atrial fibrillation  6. Hyperlipidemia  7. Diabetes mellitus  History of Present Illness:  Joyce Terry is a 76 year old female with a history of coronary artery disease and atrial fibrillation. She is status post PTCA and stenting of her mid LAD in November 2012. She also has a history of a GI bleed with a recent hemoglobin of 8.4. She had her pacer generator changed 2 weeks after her angioplasty/stent. She has not noticed any dark or tarry stools.  She has noticed worsening dyspnea recently  Dec. 16, 2013:  Joyce Terry he continues to have problems with exertional chest pain and shortness breath. She had a stress Myoview study which revealed an apical defect that I thought was due to apical thinning. Her SDS was 4.  She's been having more and more episodes of exertional chest pain. She had to take nitroglycerin last night. The pains are very similar to her previous episodes of angina last year but she states that it is not as severe yet. She thinks that the pain is worse with cold weather.  Current Outpatient Prescriptions on File Prior to Visit   Medication  Sig  Dispense  Refill   .  ALPRAZolam (XANAX) 0.25 MG tablet  Take 0.125 mg by mouth at bedtime as needed. For anxiety     .  carvedilol (COREG) 3.125 MG tablet  take 1 tablet twice a day  60 tablet  5   .  clopidogrel (PLAVIX) 75 MG tablet  take 1 tablet by mouth once daily with BREAKFAST  30 tablet  6   .  escitalopram (LEXAPRO) 10 MG tablet  Take 10 mg by mouth daily.     Marland Kitchen  esomeprazole (NEXIUM) 40 MG capsule  Take 40 mg by mouth daily  before breakfast.     .  ezetimibe-simvastatin (VYTORIN) 10-20 MG per tablet  Take 1 tablet by mouth at bedtime.     .  fentaNYL (DURAGESIC) 100 MCG/HR  Place 1 patch onto the skin every 3 (three) days. 1 patch daily     .  Fluticasone-Salmeterol (ADVAIR) 250-50 MCG/DOSE AEPB  Inhale 1 puff into the lungs every 12 (twelve) hours as needed. For shortness of breath     .  glyBURIDE-metformin (GLUCOVANCE) 5-500 MG per tablet  Take 2 tablets by mouth 2 (two) times daily with a meal.     .  insulin aspart (NOVOLOG) 100 UNIT/ML injection  Inject 4-8 Units into the skin 2 (two) times daily. If blood sugar>250, use 7-8 units. If if <200, use 4-5 units     .  insulin glargine (LANTUS) 100 UNIT/ML injection  Inject 30 Units into the skin at bedtime.     Marland Kitchen  losartan (COZAAR) 25 MG tablet  Take 1 tablet (25 mg total) by mouth daily.  30 tablet  11   .  nitroGLYCERIN (NITROSTAT) 0.4 MG SL tablet  Place 1 tablet (0.4 mg total) under the tongue every 5 (five) minutes as needed for chest pain.  25 tablet  3   .  OVER THE COUNTER MEDICATION  Place 1-2 drops  into both eyes daily as needed. For dry/itchy eyes --saline drops     .  oxyCODONE-acetaminophen (PERCOCET) 10-325 MG per tablet  Take 1 tablet by mouth every 4 (four) hours as needed. For pain     .  torsemide (DEMADEX) 20 MG tablet  take 1 tablet by mouth once daily  30 tablet  5   .  warfarin (COUMADIN) 5 MG tablet  Take 2.5-5 mg by mouth daily. Take 2.5 mg tues,and fri, take 5 mg all other days     .  zolpidem (AMBIEN) 10 MG tablet  Take 5 mg by mouth at bedtime as needed. For sleep      Allergies   Allergen  Reactions   .  Codeine  Nausea And Vomiting   .  Demerol  Nausea And Vomiting   .  Morphine And Related  Itching   .  Penicillins  Hives   .  Tape     Past Medical History   Diagnosis  Date   .  CHF (congestive heart failure)      chronic systolic CHF   .  Hypertension    .  Atrial fibrillation    .  Obesity    .  Coronary artery  disease      cardiac catheterization in 1997 -- Est. EF of 50%   .  Fatigue    .  Stress    .  Chest pain    .  Shortness of breath    .  Peptic ulcer  1985   .  Angina    .  Anemia    .  Cancer      ENDOMETRIAL   .  GERD (gastroesophageal reflux disease)    .  Depression    .  Sinoatrial node dysfunction    .  Gastric polyps    .  Colon polyps    .  Diverticulosis     Past Surgical History   Procedure  Date   .  Cardiac catheterization  02/21/96     Est. EF of 50%   .  Vesicovaginal fistula closure w/ tah  1995   .  Abdominal hysterectomy    .  Tonsillectomy    .  Joint replacement      LEFT HIP 02/2008   .  Coronary angioplasty with stent placement  02/15/11     "one"   .  Cholecystectomy  fall 2001   .  Insert / replace / remove pacemaker  07/23/99     atrial insertion of the AV node -- Medtronic single chamber pacemaker   .  Dilation and curettage of uterus      "had some when I was 18-22"    History   Smoking status   .  Former Smoker -- 1.0 packs/day for 35 years   .  Types:  Cigarettes   Smokeless tobacco   .  Never Used     Comment: quit smoking "11/1989"    History   Alcohol Use   .  Yes     Comment: "glass of wine q once in awhile"    Family History   Problem  Relation  Age of Onset   .  Heart disease  Mother    .  Hypertension  Mother     Reviw of Systems:  Reviewed in the HPI. All other systems are negative.  Physical Exam:  Blood pressure 126/58, pulse 83, height 5' 8.5" (1.74 m), weight 231  lb (104.781 kg), SpO2 94.00%.  General: Well developed, well nourished, in no acute distress.  Head: Normocephalic, atraumatic, sclera non-icteric, mucus membranes are moist,  Neck: Supple. Carotids are 2 + without bruits. No JVD  Lungs: Clear bilaterally to auscultation.  Heart: regular rate. normal S1 S2. No murmurs, gallops or rubs.  Abdomen: Soft, non-tender, non-distended with normal bowel sounds. No hepatomegaly. No rebound/guarding. No masses.  Msk:  Strength and tone are normal  Extremities: No clubbing or cyanosis. No edema. Distal pedal pulses are 2+ and equal bilaterally.  Neuro: Alert and oriented X 3. Moves all extremities spontaneously.  Psych: Responds to questions appropriately with a normal affect.  ECG:  Assessment / Plan:   CAD (coronary artery disease) - Elyn Aquas., MD 03/12/2012 2:23 PM Addendum  Joyce Terry is having symptoms c/w unstable angina.Class II-III angina. ( walking out to the car, walking up hill). She does not have CP walking in her house.  She also has severe shortness of breath with exertion. Her shortness breath resolved completely after her last stenting in November, 2012.  Her Myoview study revealed apical defect that I originally called apical thinning. I now think that "under called" this defect and suspect that she probably does have a severe apical defect.  Given her symptoms of recurrent unstable angina, I think we should proceed with cardiac catheterization. She is on Coumadin. We'll hold her Coumadin and anticipate doing a heart catheterization on Friday. I have asked her to eat salads and green vegetables before then. We'll check a protime, basic metabolic profile, and hepatic profile today. We'll check a pro time again on Friday.   Previous Version  CHF (congestive heart failure) - Elyn Aquas., MD 03/12/2012 3:03 PM Signed  She now once again has symptoms of congestive heart failure. I suspect this is an angina equivalent . Her ejection fraction is 62%.  Joyce Terry, Joyce Terry., MD, [facility] 03/16/2012, 6:52 AM Office - [phone] Pager [phone]

## 2012-03-17 ENCOUNTER — Encounter (HOSPITAL_COMMUNITY): Payer: Self-pay | Admitting: Physician Assistant

## 2012-03-17 DIAGNOSIS — I209 Angina pectoris, unspecified: Secondary | ICD-10-CM

## 2012-03-17 DIAGNOSIS — I2510 Atherosclerotic heart disease of native coronary artery without angina pectoris: Secondary | ICD-10-CM

## 2012-03-17 LAB — BASIC METABOLIC PANEL
BUN: 16 mg/dL (ref 6–23)
CO2: 27 mEq/L (ref 19–32)
Chloride: 104 mEq/L (ref 96–112)
Creatinine, Ser: 1.04 mg/dL (ref 0.50–1.10)
GFR calc Af Amer: 60 mL/min — ABNORMAL LOW (ref >90)

## 2012-03-17 LAB — GLUCOSE, CAPILLARY: Glucose-Capillary: 158 mg/dL — ABNORMAL HIGH (ref 70–99)

## 2012-03-17 LAB — CBC
HCT: 35.6 % — ABNORMAL LOW (ref 36.0–46.0)
MCV: 80.4 fL (ref 78.0–100.0)
RDW: 15.5 % (ref 11.5–15.5)
WBC: 7.7 10*3/uL (ref 4.0–10.5)

## 2012-03-17 MED ORDER — GLYBURIDE-METFORMIN 5-500 MG PO TABS: 2.0000 | ORAL_TABLET | Freq: Two times a day (BID) | ORAL | Status: DC

## 2012-03-17 MED ORDER — ASPIRIN 81 MG PO TBEC: 81.0000 mg | DELAYED_RELEASE_TABLET | Freq: Every day | ORAL | Status: DC

## 2012-03-17 NOTE — Progress Notes (Signed)
Patient ID: Joyce Terry, female   DOB: Jan 24, 1936, 76 y.o.   MRN: [id number]    Joyce Terry Date of Birth  Jul 31, 1935       Problem List: 1. Coronary artery disease- s/p PCI of LAD 11/12 2. Status post AV node ablation-Pacemaker 3. Mild congestive heart failure 4. Anemia 5. Atrial fibrillation 6. Hyperlipidemia 7. Diabetes mellitus  History of Present Illness: Joyce Terry is a 76 year old female with a history of coronary artery disease and atrial fibrillation. She is status post  PTCA and stenting of her mid LAD in November 2012.   She also has a history of a GI bleed with a recent hemoglobin of 8.4. She had her pacer generator changed 2 weeks after her angioplasty/stent. She has not noticed any dark or tarry stools. Admitted with recurrent chest pain and S/P PCI of jailed diagonal from previous LAD stent which is widely patent   No current facility-administered medications on file prior to encounter.   Current Outpatient Prescriptions on File Prior to Encounter  Medication Sig Dispense Refill  . ALPRAZolam (XANAX) 0.25 MG tablet Take 0.125 mg by mouth at bedtime as needed. For anxiety       . carvedilol (COREG) 3.125 MG tablet take 1 tablet twice a day  60 tablet  5  . escitalopram (LEXAPRO) 10 MG tablet Take 10 mg by mouth daily.        Marland Kitchen esomeprazole (NEXIUM) 40 MG capsule Take 40 mg by mouth daily before breakfast.        . ezetimibe-simvastatin (VYTORIN) 10-20 MG per tablet Take 1 tablet by mouth at bedtime.       . Fluticasone-Salmeterol (ADVAIR) 250-50 MCG/DOSE AEPB Inhale 1 puff into the lungs every 12 (twelve) hours as needed. For shortness of breath       . glyBURIDE-metformin (GLUCOVANCE) 5-500 MG per tablet Take 2 tablets by mouth 2 (two) times daily with a meal.       . insulin aspart (NOVOLOG) 100 UNIT/ML injection Inject 4-8 Units into the skin 2 (two) times daily. If blood sugar>250, use 7-8 units. If if <200, use 4-5 units       . insulin glargine (LANTUS) 100 UNIT/ML  injection Inject 30 Units into the skin at bedtime.       Marland Kitchen losartan (COZAAR) 25 MG tablet Take 1 tablet (25 mg total) by mouth daily.  30 tablet  11  . OVER THE COUNTER MEDICATION Place 1-2 drops into both eyes daily as needed. For dry/itchy eyes --saline drops       . oxyCODONE-acetaminophen (PERCOCET) 10-325 MG per tablet Take 1 tablet by mouth every 4 (four) hours as needed. For pain       . torsemide (DEMADEX) 20 MG tablet take 1 tablet by mouth once daily  30 tablet  5  . zolpidem (AMBIEN) 10 MG tablet Take 5 mg by mouth at bedtime as needed. For sleep      . nitroGLYCERIN (NITROSTAT) 0.4 MG SL tablet Place 1 tablet (0.4 mg total) under the tongue every 5 (five) minutes as needed for chest pain.  25 tablet  3  . warfarin (COUMADIN) 5 MG tablet Take 2.5-5 mg by mouth daily. Take 2.5 mg tues,and fri, take 5 mg all other days            Reviw of Systems:  Reviewed in the HPI.  All other systems are negative.  Physical Exam: Blood pressure 139/47, pulse 75, temperature 98 F (36.7 C), temperature  source Oral, resp. rate 14, height 5' 8.5" (1.74 m), weight 231 lb 7.7 oz (105 kg), SpO2 96.00%. General: Well developed, well nourished, in no acute distress.  Head: Normocephalic, atraumatic, sclera non-icteric, mucus membranes are moist,   Neck: Supple. Carotids are 2 + without bruits. No JVD  Lungs: Clear bilaterally to auscultation.  Heart: regular rate.  normal  S1 S2. No murmurs, gallops or rubs.  Abdomen: Soft, non-tender, non-distended with normal bowel sounds. No hepatomegaly. No rebound/guarding. No masses.  Cath site A  Msk:  Strength and tone are normal  Extremities: No clubbing or cyanosis. No edema.  Distal pedal pulses are 2+ and equal bilaterally.  Neuro: Alert and oriented X 3. Moves all extremities spontaneously.  Psych:  Responds to questions appropriately with a normal affect.  ECG: SR no acute ischemic changes   Assessment / Plan:  CAD:  S/P PCI of ostium of  jailed Diagonal Continue dual antiplatlet Rx F/U Dr Elease Hashimoto in 4 weks

## 2012-03-17 NOTE — Progress Notes (Signed)
CARDIAC REHAB PHASE I   PRE:  Rate/Rhythm: 70 Pacing  BP:  Supine: 139/47  Sitting:   Standing:    SaO2:   MODE:  Ambulation: 360 ft   POST:  Rate/Rhythem: 86  BP:  Supine:   Sitting: 120/73  Standing:    SaO2:  0820-0920 On arrival pt in bed sleeping, arouse pt c/o of chest feeling sore. States that it feels sore no pain, hurts when she takes deep breaths. Assisted X 1 and used pt's cane to ambulate.Gait steady with cane, slow pace. Pt tires easily she admits to limited activity due to her joint problems and SOB. She is able to shop for groceries but mostly walks in the house. Pt was able to walk 360 feet with two standing rest stops. Pt back to recliner after walk with call light in reach. Completed discharge education with pt. She voices understanding. She declines Outpt. CRP states that she physically unable to do it. I did encourage her to be as active as she is able. She is voicing interest in going to the [employer] do use the pool, encouraged her to do it.  Beatrix Fetters

## 2012-03-17 NOTE — Discharge Summary (Signed)
Discharge Summary   Patient ID: Joyce Terry,  MRN: [id number], DOB/AGE: [birth year] 76 y.o.  Admit date: 03/16/2012 Discharge date: 03/17/2012  Primary Physician: Minda Meo, MD Primary Cardiologist: Delane Ginger, MD  Discharge Diagnoses Principal Problem:  *Angina, class III Active Problems:  DM  Atrial fibrillation  PPM-Medtronic  CHF (congestive heart failure)  CAD (coronary artery disease)   Allergies Allergies  Allergen Reactions  . Demerol Nausea And Vomiting  . Penicillins Hives  . Tape Other (See Comments)    "surgical tape tears skin up; paper tape is ok"  . Morphine And Related Itching  . Codeine Nausea And Vomiting    Diagnostic Studies/Procedures  DIAGNOSTIC CARDIAC CATHETERIZATION - 03/16/12  Hemodynamics:  LV pressure: 136/11  Aortic pressure: 134/61  Angiography  Left Main: normal  Left anterior Descending: Minor luminal irreg prox. There is a stent in the proximal LAD that crosses the 1st diag. The stent has no significant ISR. The 1st diag is a large branch (larger than the LAD ) and has an 80-90 % stenosis at the take off. The distal LAD has minor luminal irregularities.  Left Circumflex: minor luminal irreg  Right Coronary Artery: large and dom. Minor luminal irreg.  LV Gram: Not done to minimize contrast. Crossed for pressures  PERCUTANEOUS CORONARY INTERVENTION - 03/16/12  Impression:  1. Severe stenosis ostium of diagonal branch (jailed by LAD stent)  2. Successful balloon angioplasty of the diagonal branch.   History of Present Illness/Hospital Course   Joyce Terry is a 76yo female who presented to [facility] for an elective cardiac catheterization. She has a history of CAD and atrial fibrillation. She is s/p PTCA/stenting mid LAD in 01/2011. H/o GIB with recent acute blood loss anemia. She had a pacemaker generator changed 2 weeks after her angioplasty/stent. No dark or tarry stools. She followed up with Dr. Elease Hashimoto on  12/16 c/o exertional chest pain. She had a recent stress Myoview revealing an apical defect believed to be attributed to apical thinning. Her chest pain episodes became more frequent and c/w prior angina. The decision was made to pursue cardiac catheterization. The details are outlined above, and resulted in severe stenosis of diagonal branch (jailed by prior LAD stent) s/p successful balloon angioplasty of the abnormality. She tolerated the procedure well and without complications. The recommendation was made to continue DAPT. She remained stable overnight, was evaluated by Dr. Eden Emms and deemed stable for discharge. She will be discharged on the medications below. She will follow-up in ~ 2-4 weeks. This information, including post-cath instructions, has been clearly outlined in the discharge AVS. She will continue Coumadin and has been advised to follow-up with her PCP for this next week.   Discharge Vitals:  Blood pressure 139/47, pulse 75, temperature 98 F (36.7 C), temperature source Oral, resp. rate 14, height 5' 8.5" (1.74 m), weight 105 kg (231 lb 7.7 oz), SpO2 96.00%.   Labs: Recent Labs  Basename 03/17/12 0500   WBC 7.7   HGB 11.0*   HCT 35.6*   MCV 80.4   PLT 244    Lab 03/17/12 0500 03/12/12 1442  NA 143 138  K 3.8 4.7  CL 104 100  CO2 27 26  BUN 16 20  CREATININE 1.04 1.2  CALCIUM 8.6 8.6  PROT -- --  BILITOT -- --  ALKPHOS -- --  ALT -- --  AST -- --  AMYLASE -- --  LIPASE -- --  GLUCOSE 151* 295*   Disposition:  Discharge Orders    Future Orders Please Complete By Expires   Diet - low sodium heart healthy      Increase activity slowly        Follow-up Information    Follow up with [facility]. (Will call with an appointment date and time. )    Contact information:   [address] [place] [address]       Follow up with Minda Meo, MD.   Contact information:   [address] [facility] [place]  [place] [address] [phone]          Discharge Medications:    Medication List     As of 03/17/2012 12:02 PM    START taking these medications         aspirin 81 MG EC tablet   Take 1 tablet (81 mg total) by mouth daily.      CONTINUE taking these medications         ALPRAZolam 0.25 MG tablet   Commonly known as: XANAX      carvedilol 3.125 MG tablet   Commonly known as: COREG   take 1 tablet twice a day      clopidogrel 75 MG tablet   Commonly known as: PLAVIX   Take 1 tablet (75 mg total) by mouth daily.      escitalopram 10 MG tablet   Commonly known as: LEXAPRO      esomeprazole 40 MG capsule   Commonly known as: NEXIUM      ezetimibe-simvastatin 10-20 MG per tablet   Commonly known as: VYTORIN      fentaNYL 50 MCG/HR   Commonly known as: DURAGESIC - dosed mcg/hr      Fluticasone-Salmeterol 250-50 MCG/DOSE Aepb   Commonly known as: ADVAIR      glyBURIDE-metformin 5-500 MG per tablet   Commonly known as: GLUCOVANCE   Take 2 tablets by mouth 2 (two) times daily with a meal.   Start taking on: 03/18/2012      insulin aspart 100 UNIT/ML injection   Commonly known as: novoLOG      insulin glargine 100 UNIT/ML injection   Commonly known as: LANTUS      losartan 25 MG tablet   Commonly known as: COZAAR   Take 1 tablet (25 mg total) by mouth daily.      nitroGLYCERIN 0.4 MG SL tablet   Commonly known as: NITROSTAT   Place 1 tablet (0.4 mg total) under the tongue every 5 (five) minutes as needed for chest pain.      OVER THE COUNTER MEDICATION      oxyCODONE-acetaminophen 10-325 MG per tablet   Commonly known as: PERCOCET      torsemide 20 MG tablet   Commonly known as: DEMADEX   take 1 tablet by mouth once daily      warfarin 5 MG tablet   Commonly known as: COUMADIN      zolpidem 10 MG tablet   Commonly known as: AMBIEN          Where to get your medications       Information on where to get these meds is not yet available. Ask your nurse  or doctor.         aspirin 81 MG EC tablet   glyBURIDE-metformin 5-500 MG per tablet           Outstanding Labs/Studies: none  Duration of Discharge Encounter: Greater than 30 minutes including physician time.  Signed,  Jacqulyn Bath, PA-C 03/17/2012, 12:02 PM

## 2012-03-19 MED FILL — Dextrose Inj 5%: INTRAVENOUS | Qty: 50 | Status: AC

## 2012-03-19 NOTE — Progress Notes (Signed)
Utilization Review Completed.   Marbella Markgraf, RN, BSN Nurse Case Manager  [phone]  

## 2012-03-26 ENCOUNTER — Other Ambulatory Visit: Payer: Self-pay

## 2012-03-26 MED ORDER — TORSEMIDE 20 MG PO TABS: 20.0000 mg | ORAL_TABLET | Freq: Every day | ORAL | Status: DC

## 2012-04-16 ENCOUNTER — Ambulatory Visit (INDEPENDENT_AMBULATORY_CARE_PROVIDER_SITE_OTHER): Payer: Medicare Other | Admitting: Cardiovascular Disease

## 2012-04-16 ENCOUNTER — Encounter: Payer: Self-pay | Admitting: Cardiovascular Disease

## 2012-04-16 ENCOUNTER — Ambulatory Visit (INDEPENDENT_AMBULATORY_CARE_PROVIDER_SITE_OTHER): Payer: Medicare Other | Admitting: *Deleted

## 2012-04-16 VITALS — BP 146/80 | HR 89 | Resp 20 | Ht 69.0 in | Wt 234.8 lb

## 2012-04-16 DIAGNOSIS — I4891 Unspecified atrial fibrillation: Secondary | ICD-10-CM

## 2012-04-16 DIAGNOSIS — I2510 Atherosclerotic heart disease of native coronary artery without angina pectoris: Secondary | ICD-10-CM

## 2012-04-16 DIAGNOSIS — Z7901 Long term (current) use of anticoagulants: Secondary | ICD-10-CM

## 2012-04-16 DIAGNOSIS — T148XXA Other injury of unspecified body region, initial encounter: Secondary | ICD-10-CM

## 2012-04-16 DIAGNOSIS — R109 Unspecified abdominal pain: Secondary | ICD-10-CM

## 2012-04-16 DIAGNOSIS — W19XXXA Unspecified fall, initial encounter: Secondary | ICD-10-CM

## 2012-04-16 DIAGNOSIS — I495 Sick sinus syndrome: Secondary | ICD-10-CM

## 2012-04-16 NOTE — Assessment & Plan Note (Signed)
Joyce Terry presents today for followup of a recent hospitalization for angioplasty of her first diagonal artery. She thinks that she has improved some but not significantly. We're able to balloon her first diagonal artery which resulted in a stenosis reduction of from 90% to about 30%. I would've expected that she would've had more improvement from opening this vessel he know we were not able to achieve perfect result. This stenosis originates in the middle of the LAD stent and it would not be wise to stent the diagonal back to the LAD stent is at increased risk of stent thrombosis.  We'll continue to watch her very closely for any signs of recurrent angina. Her diagonal vessel has a high likelihood of restenosis. We could try a cutting balloon if she needs angioplasty in.

## 2012-04-16 NOTE — Progress Notes (Signed)
Joyce Terry Date of Birth  [birth year]       [facility]    [place] [address], [address]  [address], [address] [place], [place]  [address]   [place], [place]  [address] [phone]     [phone]   Fax  [phone]    Fax [phone]  Problem List: 1. Coronary artery disease- s/p PCI of LAD 11/12 2. Status post AV node ablation-Pacemaker 3. Mild congestive heart failure 4. Anemia 5. Atrial fibrillation 6. Hyperlipidemia 7. Diabetes mellitus  History of Present Illness: Joyce Terry is a 77 year old female with a history of coronary artery disease and atrial fibrillation. She is status post  PTCA and stenting of her mid LAD in November 2012.   She also has a history of a GI bleed with a recent hemoglobin of 8.4. She had her pacer generator changed 2 weeks after her angioplasty/stent. She has not noticed any dark or tarry stools.   She has noticed worsening dyspnea recently  Dec. 16, 2013: Joyce Terry he continues to have problems with exertional chest pain and shortness breath. She had a stress Myoview study which revealed an apical defect that I thought was due to apical thinning. Her SDS was 4.  She's been having more and more episodes of exertional chest pain. She had to take nitroglycerin last night. The pains are very similar to her previous episodes of angina last year but she states that it is not as severe yet.  She thinks that the pain is worse with cold weather.  April 16, 2012:  She was hospitalized in December for episodes of chest pain. The Myoview study had revealed a fixed defect in the anterior apical wall. His son have a tight stenosis in her first diagonal artery at the origin. This area was "jailed" by her LAD stent. Dr. Sanjuana Kava performed balloon angioplasty to the ostium of the LAD through the stent struts. He was able to reduce the stenosis from 90 to about 30%. Her symptoms improved slightly.  Current Outpatient Prescriptions on  File Prior to Visit  Medication Sig Dispense Refill  . aspirin EC 81 MG EC tablet Take 1 tablet (81 mg total) by mouth daily.      . carvedilol (COREG) 3.125 MG tablet take 1 tablet twice a day  60 tablet  5  . clopidogrel (PLAVIX) 75 MG tablet Take 1 tablet (75 mg total) by mouth daily.  30 tablet  6  . escitalopram (LEXAPRO) 10 MG tablet Take 10 mg by mouth daily.        Marland Kitchen esomeprazole (NEXIUM) 40 MG capsule Take 40 mg by mouth daily before breakfast.        . ezetimibe-simvastatin (VYTORIN) 10-20 MG per tablet Take 1 tablet by mouth at bedtime.       . fentaNYL (DURAGESIC - DOSED MCG/HR) 50 MCG/HR Place 1 patch onto the skin every 3 (three) days.      . Fluticasone-Salmeterol (ADVAIR) 250-50 MCG/DOSE AEPB Inhale 1 puff into the lungs every 12 (twelve) hours as needed. For shortness of breath       . glyBURIDE-metformin (GLUCOVANCE) 5-500 MG per tablet Take 2 tablets by mouth 2 (two) times daily with a meal.      . insulin aspart (NOVOLOG) 100 UNIT/ML injection Inject 4-8 Units into the skin 2 (two) times daily. If blood sugar>250, use 7-8 units. If if <200, use 4-5 units       . insulin glargine (LANTUS) 100  UNIT/ML injection Inject 30 Units into the skin at bedtime.       Marland Kitchen losartan (COZAAR) 25 MG tablet Take 1 tablet (25 mg total) by mouth daily.  30 tablet  11  . nitroGLYCERIN (NITROSTAT) 0.4 MG SL tablet Place 1 tablet (0.4 mg total) under the tongue every 5 (five) minutes as needed for chest pain.  25 tablet  3  . OVER THE COUNTER MEDICATION Place 1-2 drops into both eyes daily as needed. For dry/itchy eyes --saline drops       . oxyCODONE-acetaminophen (PERCOCET) 10-325 MG per tablet Take 1 tablet by mouth every 4 (four) hours as needed. For pain       . torsemide (DEMADEX) 20 MG tablet Take 1 tablet (20 mg total) by mouth daily.  30 tablet  5  . warfarin (COUMADIN) 5 MG tablet Take 2.5-5 mg by mouth daily. Take 2.5 mg tues,and fri, take 5 mg all other days       . zolpidem (AMBIEN) 10 MG  tablet Take 5 mg by mouth at bedtime as needed. For sleep      . ALPRAZolam (XANAX) 0.25 MG tablet Take 0.125 mg by mouth at bedtime as needed. For anxiety         Allergies  Allergen Reactions  . Demerol Nausea And Vomiting  . Penicillins Hives  . Tape Other (See Comments)    "surgical tape tears skin up; paper tape is ok"  . Morphine And Related Itching  . Codeine Nausea And Vomiting    Past Medical History  Diagnosis Date  . CHF (congestive heart failure)     chronic systolic CHF  . Hypertension   . Atrial fibrillation   . Obesity   . Coronary artery disease     cardiac catheterization in 1997 -- Est. EF of 50%  . Fatigue   . Stress   . Chest pain   . Peptic ulcer 1985  . Angina   . GERD (gastroesophageal reflux disease)   . Depression   . Sinoatrial node dysfunction   . Gastric polyps   . Colon polyps   . Diverticulosis   . Endometrial cancer 1995  . Hypercholesteremia   . DVT (deep venous thrombosis) 1960's    ?RLE (03/16/2012)  . Pneumonia 1991  . Exertional dyspnea     "sometimes" (03/16/2012)  . Type II diabetes mellitus   . Anemia     "iron transfusions ?twice" (03/16/2012)  . Internal hemorrhoid, bleeding     "sometimes" (03/16/2012)  . Migraines     "when I was going thru menopause; none lately" (03/16/2012)  . Arthritis     "qwhere" (03/16/2012)  . Chronic back pain     Past Surgical History  Procedure Date  . Vesicovaginal fistula closure w/ tah 1995  . Joint replacement     LEFT HIP   02/2008  . Cholecystectomy fall 2001  . Cardiac catheterization 02/21/96; 03/16/2012    Est. EF of 50%; "just to look" (03/16/2012)  . Coronary angioplasty with stent placement 02/15/11    "one"  . Coronary angioplasty 03/16/2012    "couldn't put stent in cause of where it is" (03/16/2012)  . Tonsillectomy ~ 1962  . Abdominal hysterectomy 1995    "endometrial cancer" (03/16/2012)  . Dilation and curettage of uterus 1950's    "had probably 5; several  miscarriages when I was younger"  . Total hip arthroplasty 02/2008    "left" (03/16/2012)  . Insert / replace / remove pacemaker  07/23/99    atrial insertion of the AV node -- Medtronic single chamber pacemaker   . Insert / replace / remove pacemaker 2012    "replaced" (03/16/2012)  . Ptca 02/2012    Balloon angioplast of jailed diagonal branch by prior LAD stent    History  Smoking status  . Former Smoker -- 1.0 packs/day for 35 years  . Types: Cigarettes  Smokeless tobacco  . Never Used    Comment: quit smoking "11/1989"    History  Alcohol Use  . Yes    Comment: "glass of wine q once in awhile"    Family History  Problem Relation Age of Onset  . Heart disease Mother   . Hypertension Mother     Reviw of Systems:  Reviewed in the HPI.  All other systems are negative.  Physical Exam: Blood pressure 146/80, pulse 89, resp. rate 20, height 5\' 9"  (1.753 m), weight 234 lb 12.8 oz (106.505 kg), SpO2 96.00%. General: Well developed, well nourished, in no acute distress.  Head: Normocephalic, atraumatic, sclera non-icteric, mucus membranes are moist,   Neck: Supple. Carotids are 2 + without bruits. No JVD  Lungs: Clear bilaterally to auscultation.  Heart: regular rate.  normal  S1 S2. No murmurs, gallops or rubs.   Abdomen: she is very tender along her left lateral abdomen.  No rebound. ? Mass / hematoma.   Msk:  Strength and tone are normal  Extremities: No clubbing or cyanosis. No edema.  Distal pedal pulses are 2+ and equal bilaterally.  Neuro: Alert and oriented X 3. Moves all extremities spontaneously.  Psych:  Responds to questions appropriately with a normal affect.  ECG: Pacemaker interrogation reveals that the pacer is working normally. Assessment / Plan:

## 2012-04-16 NOTE — Assessment & Plan Note (Signed)
She has chronic atrial fibrillation and has a pacemaker. She is on chronic anticoagulation including aspirin, Plavix, and Coumadin.  She recently fell at on her left flank. She has significant tenderness and a question of a  mass in her left  abdomen. We'll get a CT scan of her abdomen to rule out splenic rupture other large hematoma. If she is found to have a large abdominal hematoma then we will probably want to hold her Coumadin for a week or so. We will forward these results to her medical Dr.

## 2012-04-16 NOTE — Patient Instructions (Addendum)
Non-Cardiac CT scanning, (CAT scanning), is a noninvasive, special x-ray that produces cross-sectional images of the body using x-rays and a computer. CT scans help physicians diagnose and treat medical conditions. For some CT exams, a contrast material is used to enhance visibility in the area of the body being studied. CT scans provide greater clarity and reveal more details than regular x-ray exams.  Your physician recommends that you schedule a follow-up appointment in: 3 Months

## 2012-04-17 ENCOUNTER — Ambulatory Visit (INDEPENDENT_AMBULATORY_CARE_PROVIDER_SITE_OTHER)
Admission: RE | Admit: 2012-04-17 | Discharge: 2012-04-17 | Disposition: A | Discharge status: Home / Self Care | Payer: Medicare Other | Source: Ambulatory Visit | Attending: Cardiovascular Disease | Admitting: Cardiovascular Disease

## 2012-04-17 DIAGNOSIS — R109 Unspecified abdominal pain: Secondary | ICD-10-CM

## 2012-04-17 DIAGNOSIS — W19XXXA Unspecified fall, initial encounter: Secondary | ICD-10-CM

## 2012-04-17 DIAGNOSIS — T148XXA Other injury of unspecified body region, initial encounter: Secondary | ICD-10-CM

## 2012-04-17 DIAGNOSIS — Z7901 Long term (current) use of anticoagulants: Secondary | ICD-10-CM

## 2012-04-17 LAB — PACEMAKER DEVICE OBSERVATION
BATTERY VOLTAGE: 2.78 V
BMOD-0005RV: 95 {beats}/min
BRDY-0002RV: 70 {beats}/min

## 2012-04-17 NOTE — Progress Notes (Signed)
Pacer check in clinic due to fall.

## 2012-05-03 ENCOUNTER — Encounter: Payer: Self-pay | Admitting: Internal Medicine

## 2012-05-16 ENCOUNTER — Other Ambulatory Visit: Payer: Self-pay | Admitting: Emergency Medicine

## 2012-05-16 MED ORDER — LOSARTAN POTASSIUM 25 MG PO TABS: 25.0000 mg | ORAL_TABLET | Freq: Every day | ORAL | Status: DC

## 2012-06-22 ENCOUNTER — Other Ambulatory Visit: Payer: Self-pay | Admitting: Cardiology

## 2012-06-22 MED ORDER — NITROGLYCERIN 0.4 MG SL SUBL: 0.4000 mg | SUBLINGUAL_TABLET | SUBLINGUAL | Status: DC | PRN

## 2012-06-25 ENCOUNTER — Other Ambulatory Visit: Payer: Self-pay | Admitting: *Deleted

## 2012-06-25 MED ORDER — NITROGLYCERIN 0.4 MG SL SUBL: 0.4000 mg | SUBLINGUAL_TABLET | SUBLINGUAL | Status: DC | PRN

## 2012-07-11 ENCOUNTER — Other Ambulatory Visit: Payer: Self-pay

## 2012-07-11 MED ORDER — CARVEDILOL 3.125 MG PO TABS: ORAL_TABLET | ORAL | Status: DC

## 2012-07-16 ENCOUNTER — Encounter: Payer: Self-pay | Admitting: *Deleted

## 2012-07-16 ENCOUNTER — Ambulatory Visit (INDEPENDENT_AMBULATORY_CARE_PROVIDER_SITE_OTHER): Payer: Medicare Other | Admitting: Cardiovascular Disease

## 2012-07-16 ENCOUNTER — Encounter: Payer: Self-pay | Admitting: Cardiovascular Disease

## 2012-07-16 VITALS — BP 112/66 | HR 93 | Ht 69.0 in | Wt 226.0 lb

## 2012-07-16 DIAGNOSIS — I2510 Atherosclerotic heart disease of native coronary artery without angina pectoris: Secondary | ICD-10-CM

## 2012-07-16 NOTE — Progress Notes (Signed)
Joyce Terry Date of Birth  December 23, 1935       [facility]    [place] [address]  [address] [place], [place]  [address]   [place], [place]  [address] [phone]     [phone]   Fax  [phone]    Fax [phone]  Problem List: 1. Coronary artery disease- s/p PCI of LAD 11/12 2. Status post AV node ablation-Pacemaker 3. Mild congestive heart failure 4. Anemia 5. Atrial fibrillation 6. Hyperlipidemia 7. Diabetes mellitus  History of Present Illness: Joyce Terry is a 77 year old female with a history of coronary artery disease and atrial fibrillation. She is status post  PTCA and stenting of her mid LAD in November 2012.   She also has a history of a GI bleed with a recent hemoglobin of 8.4. She had her pacer generator changed 2 weeks after her angioplasty/stent. She has not noticed any dark or tarry stools.   She has noticed worsening dyspnea recently  Dec. 16, 2013: Joyce Terry he continues to have problems with exertional chest pain and shortness breath. She had a stress Myoview study which revealed an apical defect that I thought was due to apical thinning. Her SDS was 4.  She's been having more and more episodes of exertional chest pain. She had to take nitroglycerin last night. The pains are very similar to her previous episodes of angina last year but she states that it is not as severe yet.  She thinks that the pain is worse with cold weather.  April 16, 2012:  She was hospitalized in December for episodes of chest pain. The Myoview study had revealed a fixed defect in the anterior apical wall.  She was found to have a tight stenosis in her first diagonal artery at the origin. This area was "jailed" by her LAD stent. Dr. Sanjuana Kava performed balloon angioplasty to the ostium of the LAD through the stent struts. He was able to reduce the stenosis from 90 to about 30%. Her symptoms improved slightly.  July 16, 2012:  Pt is  doing well from a cardiac standpoint.  She is having some rectal bleeding .  We had noticed some abdominal tenderness and swelling.  CT of abdomin was normal.     her aspirin has been stopped.    Current Outpatient Prescriptions on File Prior to Visit  Medication Sig Dispense Refill  . ALPRAZolam (XANAX) 0.25 MG tablet Take 0.125 mg by mouth at bedtime as needed. For anxiety       . aspirin EC 81 MG EC tablet Take 1 tablet (81 mg total) by mouth daily.      . carvedilol (COREG) 3.125 MG tablet take 1 tablet twice a day  60 tablet  5  . clopidogrel (PLAVIX) 75 MG tablet Take 1 tablet (75 mg total) by mouth daily.  30 tablet  6  . escitalopram (LEXAPRO) 10 MG tablet Take 10 mg by mouth daily.        Marland Kitchen esomeprazole (NEXIUM) 40 MG capsule Take 40 mg by mouth daily before breakfast.        . ezetimibe-simvastatin (VYTORIN) 10-20 MG per tablet Take 1 tablet by mouth at bedtime.       . fentaNYL (DURAGESIC - DOSED MCG/HR) 50 MCG/HR Place 1 patch onto the skin every 3 (three) days.      . Fluticasone-Salmeterol (ADVAIR) 250-50 MCG/DOSE AEPB Inhale 1 puff into the lungs every 12 (twelve) hours as needed. For  shortness of breath       . glyBURIDE-metformin (GLUCOVANCE) 5-500 MG per tablet Take 2 tablets by mouth 2 (two) times daily with a meal.      . insulin aspart (NOVOLOG) 100 UNIT/ML injection Inject 4-8 Units into the skin 2 (two) times daily. If blood sugar>250, use 7-8 units. If if <200, use 4-5 units       . insulin glargine (LANTUS) 100 UNIT/ML injection Inject 30 Units into the skin at bedtime.       Marland Kitchen losartan (COZAAR) 25 MG tablet Take 1 tablet (25 mg total) by mouth daily.  30 tablet  11  . nitroGLYCERIN (NITROSTAT) 0.4 MG SL tablet Place 1 tablet (0.4 mg total) under the tongue every 5 (five) minutes as needed for chest pain.  25 tablet  3  . OVER THE COUNTER MEDICATION Place 1-2 drops into both eyes daily as needed. For dry/itchy eyes --saline drops       . oxyCODONE-acetaminophen  (PERCOCET) 10-325 MG per tablet Take 1 tablet by mouth every 4 (four) hours as needed. For pain       . torsemide (DEMADEX) 20 MG tablet Take 1 tablet (20 mg total) by mouth daily.  30 tablet  5  . warfarin (COUMADIN) 5 MG tablet Take 2.5-5 mg by mouth daily. Take 2.5 mg tues,and fri, take 5 mg all other days       . zolpidem (AMBIEN) 10 MG tablet Take 5 mg by mouth at bedtime as needed. For sleep       No current facility-administered medications on file prior to visit.    Allergies  Allergen Reactions  . Demerol Nausea And Vomiting  . Penicillins Hives  . Tape Other (See Comments)    "surgical tape tears skin up; paper tape is ok"  . Morphine And Related Itching  . Codeine Nausea And Vomiting    Past Medical History  Diagnosis Date  . CHF (congestive heart failure)     chronic systolic CHF  . Hypertension   . Atrial fibrillation   . Obesity   . Coronary artery disease     cardiac catheterization in 1997 -- Est. EF of 50%  . Fatigue   . Stress   . Chest pain   . Peptic ulcer 1985  . Angina   . GERD (gastroesophageal reflux disease)   . Depression   . Sinoatrial node dysfunction   . Gastric polyps   . Colon polyps   . Diverticulosis   . Endometrial cancer 1995  . Hypercholesteremia   . DVT (deep venous thrombosis) 1960's    ?RLE (03/16/2012)  . Pneumonia 1991  . Exertional dyspnea     "sometimes" (03/16/2012)  . Type II diabetes mellitus   . Anemia     "iron transfusions ?twice" (03/16/2012)  . Internal hemorrhoid, bleeding     "sometimes" (03/16/2012)  . Migraines     "when I was going thru menopause; none lately" (03/16/2012)  . Arthritis     "qwhere" (03/16/2012)  . Chronic back pain     Past Surgical History  Procedure Laterality Date  . Vesicovaginal fistula closure w/ tah  1995  . Joint replacement      LEFT HIP   02/2008  . Cholecystectomy  fall 2001  . Cardiac catheterization  02/21/96; 03/16/2012    Est. EF of 50%; "just to look" (03/16/2012)   . Coronary angioplasty with stent placement  02/15/11    "one"  . Coronary angioplasty  03/16/2012    "  couldn't put stent in cause of where it is" (03/16/2012)  . Tonsillectomy  ~ 1962  . Abdominal hysterectomy  1995    "endometrial cancer" (03/16/2012)  . Dilation and curettage of uterus  1950's    "had probably 5; several miscarriages when I was younger"  . Total hip arthroplasty  02/2008    "left" (03/16/2012)  . Insert / replace / remove pacemaker  07/23/99    atrial insertion of the AV node -- Medtronic single chamber pacemaker   . Insert / replace / remove pacemaker  2012    "replaced" (03/16/2012)  . Ptca  02/2012    Balloon angioplast of jailed diagonal branch by prior LAD stent    History  Smoking status  . Former Smoker -- 1.00 packs/day for 35 years  . Types: Cigarettes  Smokeless tobacco  . Never Used    Comment: quit smoking "11/1989"    History  Alcohol Use  . Yes    Comment: "glass of wine q once in awhile"    Family History  Problem Relation Age of Onset  . Heart disease Mother   . Hypertension Mother     Reviw of Systems:  Reviewed in the HPI.  All other systems are negative.  Physical Exam: Blood pressure 112/66, pulse 93, height 5\' 9"  (1.753 m), weight 226 lb (102.513 kg), SpO2 97.00%. General: Well developed, well nourished, in no acute distress.  Head: Normocephalic, atraumatic, sclera non-icteric, mucus membranes are moist,   Neck: Supple. Carotids are 2 + without bruits. No JVD  Lungs: Clear bilaterally to auscultation.  Heart: regular rate.  normal  S1 S2. No murmurs, gallops or rubs.   Abdomen: nontender, the mass noticed at the previous visit has resolve.d  Msk:  Strength and tone are normal  Extremities: No clubbing or cyanosis. No edema.  Distal pedal pulses are 2+ and equal bilaterally.  Neuro: Alert and oriented X 3. Moves all extremities spontaneously.  Psych:  Responds to questions appropriately with a normal  affect.  ECG:  Assessment / Plan:

## 2012-07-16 NOTE — Assessment & Plan Note (Signed)
She is stable.  Continue current meds.

## 2012-07-16 NOTE — Patient Instructions (Addendum)
Your physician wants you to follow-up in: 6 months  You will receive a reminder letter in the mail two months in advance. If you don't receive a letter, please call our office to schedule the follow-up appointment.  Your physician recommends that you continue on your current medications as directed. Please refer to the Current Medication list given to you today.  

## 2012-07-17 NOTE — Assessment & Plan Note (Signed)
She remains stable.  She has not had much angina recently.

## 2012-09-10 ENCOUNTER — Other Ambulatory Visit (HOSPITAL_COMMUNITY): Payer: Self-pay | Admitting: Internal Medicine

## 2012-09-10 DIAGNOSIS — Z1231 Encounter for screening mammogram for malignant neoplasm of breast: Secondary | ICD-10-CM

## 2012-09-21 ENCOUNTER — Ambulatory Visit (HOSPITAL_COMMUNITY)
Admission: RE | Admit: 2012-09-21 | Discharge: 2012-09-21 | Disposition: A | Discharge status: Home / Self Care | Payer: Medicare Other | Source: Ambulatory Visit | Attending: Internal Medicine | Admitting: Internal Medicine

## 2012-09-21 DIAGNOSIS — Z1231 Encounter for screening mammogram for malignant neoplasm of breast: Secondary | ICD-10-CM | POA: Insufficient documentation

## 2012-09-24 ENCOUNTER — Other Ambulatory Visit: Payer: Self-pay | Admitting: *Deleted

## 2012-09-24 MED ORDER — TORSEMIDE 20 MG PO TABS: 20.0000 mg | ORAL_TABLET | Freq: Every day | ORAL | Status: DC

## 2012-09-24 NOTE — Telephone Encounter (Signed)
Fax Received. Refill Completed. Joyce Terry (R.M.A)   

## 2012-10-15 ENCOUNTER — Ambulatory Visit (INDEPENDENT_AMBULATORY_CARE_PROVIDER_SITE_OTHER): Payer: Medicare Other | Admitting: *Deleted

## 2012-10-15 ENCOUNTER — Encounter: Payer: Self-pay | Admitting: Internal Medicine

## 2012-10-15 DIAGNOSIS — I4891 Unspecified atrial fibrillation: Secondary | ICD-10-CM

## 2012-10-15 DIAGNOSIS — I495 Sick sinus syndrome: Secondary | ICD-10-CM

## 2012-10-15 DIAGNOSIS — Z950 Presence of cardiac pacemaker: Secondary | ICD-10-CM

## 2012-10-15 NOTE — Progress Notes (Signed)
Device check in clinic. All functions normal, no changes made, full details in paceart.  ROV w/ Dr. Ladona Ridgel 02/2013. Estella Husk

## 2012-10-18 LAB — PACEMAKER DEVICE OBSERVATION
BMOD-0005RV: 95 {beats}/min
VENTRICULAR PACING PM: 100

## 2012-10-24 ENCOUNTER — Telehealth: Payer: Self-pay | Admitting: Internal Medicine

## 2012-10-24 NOTE — Telephone Encounter (Signed)
Pt scheduled to see Doug Sou PA Friday 10/26/12@2pm . Left message for Malachi Bonds to call back.

## 2012-10-25 ENCOUNTER — Encounter (HOSPITAL_COMMUNITY): Payer: Self-pay

## 2012-10-25 ENCOUNTER — Encounter (HOSPITAL_COMMUNITY)
Admission: RE | Admit: 2012-10-25 | Discharge: 2012-10-25 | Disposition: A | Discharge status: Home / Self Care | Payer: Medicare Other | Source: Ambulatory Visit | Attending: Internal Medicine | Admitting: Internal Medicine

## 2012-10-25 DIAGNOSIS — D649 Anemia, unspecified: Secondary | ICD-10-CM | POA: Insufficient documentation

## 2012-10-25 MED ORDER — OXYCODONE-ACETAMINOPHEN 5-325 MG PO TABS
2.0000 | ORAL_TABLET | Freq: Once | ORAL | Status: AC
  Administered 2012-10-25: 2 via ORAL
  Filled 2012-10-25: qty 2

## 2012-10-25 MED ORDER — SODIUM CHLORIDE 0.9 % IV SOLN
Freq: Once | INTRAVENOUS | Status: AC
  Administered 2012-10-25: 08:00:00 via INTRAVENOUS

## 2012-10-25 MED ORDER — FUROSEMIDE 10 MG/ML IJ SOLN
40.0000 mg | Freq: Once | INTRAMUSCULAR | Status: AC
  Administered 2012-10-25: 40 mg via INTRAVENOUS
  Filled 2012-10-25: qty 4

## 2012-10-25 NOTE — Progress Notes (Signed)
Patient had to go to restroom urgently

## 2012-10-25 NOTE — Telephone Encounter (Signed)
Malachi Bonds aware and will notify pt of appt date and time.

## 2012-10-26 ENCOUNTER — Encounter: Payer: Self-pay | Admitting: *Deleted

## 2012-10-26 ENCOUNTER — Ambulatory Visit (INDEPENDENT_AMBULATORY_CARE_PROVIDER_SITE_OTHER): Payer: Medicare Other | Admitting: Gastroenterology

## 2012-10-26 VITALS — BP 126/60 | HR 68 | Ht 68.5 in | Wt 225.0 lb

## 2012-10-26 DIAGNOSIS — D509 Iron deficiency anemia, unspecified: Secondary | ICD-10-CM

## 2012-10-26 LAB — TYPE AND SCREEN

## 2012-10-26 MED ORDER — HYDROCORTISONE ACE-PRAMOXINE 2.5-1 % RE CREA: TOPICAL_CREAM | Freq: Two times a day (BID) | RECTAL | Status: DC

## 2012-10-26 NOTE — Progress Notes (Signed)
Agree with assessment and plans as outlined. Tremendous anticoagulation with both coumadin and plavix. Suspect chronic slow (thankfully) GI blood loss from known AVMs and / or non-specific mucosal oozing. I still think she is too high risk vs benefit ratio for endoscopic evaluations. Need to be compliant with bid iron indefinitely. Regular CBC/ferritin with PCP and prn transfusions as deemed necessary by Dr Jacky Kindle. If iron infusions needed, Dr Jacky Kindle would determined and arrange. Please send him a copy of the office visit note with my comments. Thanks

## 2012-10-26 NOTE — Progress Notes (Signed)
10/26/2012 Joyce Terry [id number] Oct 24, 1935   History of Present Illness: Joyce Terry is a 77 y.o. Female who is a patient of Dr. Lamar Sprinkles.  She has multiple significant medical problems including hypertension, chronic atrial fibrillation, chronic systemic anticoagulation with Coumadin, insulin requiring diabetes mellitus, obesity, congestive heart failure, coronary artery disease status post stenting of the LAD November 2012 with resultant chronic Plavix therapy, GERD with peptic stricture, and adenomatous colon polyps. She also has a history of iron deficiency anemia. She saw Dr. Marina Goodell in 08/2011 in regards to the IDA and surveillance colonoscopy, however, due to her medical issues, it was recommended that she not undergo any procedures at that time.  The plan was to have her Hgb monitored regularly and give IV iron infusions prn, since she responded well to these in the past; she did not respond as well to PO iron.  She initially underwent colonoscopy and upper endoscopy were in June of 2009. Colonoscopy revealed multiple colon polyps which were adenomatous as well as tubulovillous adenomatous. She was also noted to have marked diverticulosis. Upper endoscopy was unremarkable except for fundic gland polyps. She was subsequently seen in December 2010 for iron deficiency anemia. She was on Coumadin therapy at that time, though this was interrupted for her procedures. Colonoscopy at that time revealed diminutive adenomatous polyps and diverticulosis as well as a small AVM of the cecum. Upper endoscopy revealed a distal esophageal ring, which was dilated for complaints of dysphagia and since that time she had continued on PPI therapy with good control of reflux symptoms.  Last colonoscopy 04/2009 also revealed some colonic polyps that were tubular adenomas and sessile serrated adenomas.  She returns again today for evaluation of IDA.  She was feeling weak and very fatigued recently and Hgb was found to be  8.5 grams (previously 11.0 grams just 7 months ago in 02/2012).  She received two units of PRBC's yesterday.  Feels better today.  Says that she does have some hemorrhoids that cause her to see some bright red blood on the toilet paper at times, and she would like some prescription medication to use on it.  She continues on Coumadin and Plavix   Current Medications, Allergies, Past Medical History, Past Surgical History, Family History and Social History were reviewed in [employer] record.   Physical Exam: BP 126/60  Pulse 68  Ht 5' 8.5" (1.74 m)  Wt 225 lb (102.059 kg)  BMI 33.71 kg/m2 General: Elderly white female in no acute distress Head: Normocephalic and atraumatic Eyes:  sclerae anicteric, conjunctiva pink  Ears: Normal auditory acuity Lungs: Clear throughout to auscultation Heart: Regular rate and rhythm Abdomen: Soft, non-tender and non-distended. No masses, no hepatomegaly. Normal bowel sounds. Musculoskeletal: Symmetrical with no gross deformities  Extremities: No edema  Neurological: Alert oriented x 4, grossly nonfocal Psychological:  Alert and cooperative. Normal mood and affect  Assessment and Recommendations: #1. Recurrent iron deficiency anemia in a patient on Plavix and Coumadin. Previously documented cecal AVM. May have additional AVMs as a cause for iron deficiency anemia. Could also have nonspecific mucosal oozing on these therapies. Fortunately, no significant gross bleeding. As well, good response to iron infusion therapy in the past. #2. GERD with a history of peptic stricture. No GERD symptoms on PPI. #3. History of multiple adenomatous colon polyps. Last examination February 2011. #4.  Hemorrhoids.  Will give her a prescription for analpram cream 2.5% to use BID for 7-10 days prn. #5. Multiple significant medical  problems including LAD stent placement in November. Now on Plavix in addition to Coumadin.  At this point, we will plan to  proceed as Dr. Marina Goodell had recommended last year at this time.  We will hold off with scheduling any procedures.  We will check a CBC and iron studies in two weeks and she will need close monitoring of these values.  She will likely need to be set up for intermittent iron infusions.  I will see if Dr. Marina Goodell would like a hematology referral for this or if he would agree to order them for her prn.  Will follow-up on Dr. Lamar Sprinkles thoughts and recommendations regarding this case as well.

## 2012-10-26 NOTE — Patient Instructions (Addendum)
Please come to our lab basement level, on 11-09-2012 for bloodwork. We will call you with the results.  . We sent a prescription to [facility] [phone] )  for Analpram cream.

## 2012-10-29 ENCOUNTER — Telehealth: Payer: Self-pay

## 2012-10-29 NOTE — Telephone Encounter (Signed)
Office notes faxed per Dr. Abagail Kitchens request to pts PCP Dr. Jacky Kindle.

## 2012-11-05 ENCOUNTER — Other Ambulatory Visit: Payer: Self-pay | Admitting: *Deleted

## 2012-11-05 MED ORDER — CLOPIDOGREL BISULFATE 75 MG PO TABS: 75.0000 mg | ORAL_TABLET | Freq: Every day | ORAL | Status: DC

## 2012-11-05 NOTE — Telephone Encounter (Signed)
Fax Received. Refill Completed. Celina Shiley Chowoe (R.M.A)   

## 2012-11-09 ENCOUNTER — Other Ambulatory Visit (INDEPENDENT_AMBULATORY_CARE_PROVIDER_SITE_OTHER): Payer: Medicare Other

## 2012-11-09 DIAGNOSIS — D509 Iron deficiency anemia, unspecified: Secondary | ICD-10-CM

## 2012-11-09 LAB — CBC WITH DIFFERENTIAL/PLATELET
Basophils Absolute: 0 10*3/uL (ref 0.0–0.1)
Lymphocytes Relative: 14.5 % (ref 12.0–46.0)
Lymphs Abs: 1.2 10*3/uL (ref 0.7–4.0)
Monocytes Relative: 6.8 % (ref 3.0–12.0)
Neutrophils Relative %: 77 % (ref 43.0–77.0)
Platelets: 319 10*3/uL (ref 150.0–400.0)
RDW: 22.3 % — ABNORMAL HIGH (ref 11.5–14.6)

## 2012-11-09 LAB — IBC PANEL
Iron: 40 ug/dL — ABNORMAL LOW (ref 42–145)
Saturation Ratios: 8.9 % — ABNORMAL LOW (ref 20.0–50.0)

## 2012-11-09 LAB — FERRITIN: Ferritin: 4.6 ng/mL — ABNORMAL LOW (ref 10.0–291.0)

## 2012-11-22 ENCOUNTER — Encounter (HOSPITAL_COMMUNITY)
Admission: RE | Admit: 2012-11-22 | Discharge: 2012-11-22 | Disposition: A | Discharge status: Home / Self Care | Payer: Medicare Other | Source: Ambulatory Visit | Attending: Internal Medicine | Admitting: Internal Medicine

## 2012-11-22 DIAGNOSIS — D649 Anemia, unspecified: Secondary | ICD-10-CM | POA: Insufficient documentation

## 2012-11-23 ENCOUNTER — Ambulatory Visit (HOSPITAL_COMMUNITY)
Admission: RE | Admit: 2012-11-23 | Discharge: 2012-11-23 | Disposition: A | Discharge status: Home / Self Care | Payer: Medicare Other | Source: Ambulatory Visit | Attending: Internal Medicine | Admitting: Internal Medicine

## 2012-11-23 ENCOUNTER — Encounter (HOSPITAL_COMMUNITY): Payer: Self-pay

## 2012-11-23 ENCOUNTER — Other Ambulatory Visit (HOSPITAL_COMMUNITY): Payer: Self-pay | Admitting: Internal Medicine

## 2012-11-23 DIAGNOSIS — D649 Anemia, unspecified: Secondary | ICD-10-CM | POA: Insufficient documentation

## 2012-11-23 MED ORDER — FUROSEMIDE 10 MG/ML IJ SOLN
20.0000 mg | Freq: Once | INTRAMUSCULAR | Status: AC
  Administered 2012-11-23: 20 mg via INTRAVENOUS
  Filled 2012-11-23: qty 2

## 2012-11-23 MED ORDER — SODIUM CHLORIDE 0.9 % IV SOLN
INTRAVENOUS | Status: DC
  Administered 2012-11-23: 09:00:00 via INTRAVENOUS

## 2012-11-24 LAB — TYPE AND SCREEN
Antibody Screen: NEGATIVE
Unit division: 0

## 2013-01-01 ENCOUNTER — Other Ambulatory Visit: Payer: Self-pay

## 2013-01-01 MED ORDER — CARVEDILOL 3.125 MG PO TABS: ORAL_TABLET | ORAL | Status: DC

## 2013-02-12 ENCOUNTER — Encounter: Payer: Self-pay | Admitting: Cardiovascular Disease

## 2013-02-12 ENCOUNTER — Ambulatory Visit (INDEPENDENT_AMBULATORY_CARE_PROVIDER_SITE_OTHER): Payer: Medicare Other | Admitting: Cardiovascular Disease

## 2013-02-12 VITALS — BP 145/78 | HR 78 | Ht 68.5 in | Wt 222.0 lb

## 2013-02-12 DIAGNOSIS — I4891 Unspecified atrial fibrillation: Secondary | ICD-10-CM

## 2013-02-12 DIAGNOSIS — I2510 Atherosclerotic heart disease of native coronary artery without angina pectoris: Secondary | ICD-10-CM

## 2013-02-12 NOTE — Patient Instructions (Addendum)
Your physician wants you to follow-up in: 6 months  You will receive a reminder letter in the mail two months in advance. If you don't receive a letter, please call our office to schedule the follow-up appointment.   Your physician recommends that you continue on your current medications as directed. Please refer to the Current Medication list given to you today.  Your physician has recommended you make the following change in your medication: stop plavix due to GI bleed

## 2013-02-12 NOTE — Progress Notes (Signed)
[name] Date of Birth  1936-02-09       [facility]    [place] [address]  [address] [place], [place]  [address]   [place], [place]  [address] [phone]     [phone]   Fax  [phone]    Fax [phone]  Problem List: 1. Coronary artery disease- s/p PCI of LAD 11/12 2. Status post AV node ablation-Pacemaker 3. Mild congestive heart failure 4. Anemia 5. Atrial fibrillation 6. Hyperlipidemia 7. Diabetes mellitus  History of Present Illness: Joyce Terry is a 77 year old female with a history of coronary artery disease and atrial fibrillation. She is status post  PTCA and stenting of her mid LAD in November 2012.   She also has a history of a GI bleed with a recent hemoglobin of 8.4. She had her pacer generator changed 2 weeks after her angioplasty/stent. She has not noticed any dark or tarry stools.   She has noticed worsening dyspnea recently  Dec. 16, 2013: [name] he continues to have problems with exertional chest pain and shortness breath. She had a stress Myoview study which revealed an apical defect that I thought was due to apical thinning. Her SDS was 4.  She's been having more and more episodes of exertional chest pain. She had to take nitroglycerin last night. The pains are very similar to her previous episodes of angina last year but she states that it is not as severe yet.  She thinks that the pain is worse with cold weather.  April 16, 2012:  She was hospitalized in December for episodes of chest pain. The Myoview study had revealed a fixed defect in the anterior apical wall.  She was found to have a tight stenosis in her first diagonal artery at the origin. This area was "jailed" by her LAD stent. Dr. Sanjuana Kava performed balloon angioplasty to the ostium of the LAD through the stent struts. He was able to reduce the stenosis from 90 to about 30%. Her symptoms improved slightly.  July 16, 2012:  Pt is  doing well from a cardiac standpoint.  She is having some rectal bleeding .  We had noticed some abdominal tenderness and swelling.  CT of abdomin was normal.   her aspirin has been stopped.   Nov. 18, 2014:   Joyce Terry is doing OK.    Still having rectal bleeding - she thinks its hemerroids    Current Outpatient Prescriptions on File Prior to Visit  Medication Sig Dispense Refill  . carvedilol (COREG) 3.125 MG tablet take 1 tablet twice a day  60 tablet  1  . clopidogrel (PLAVIX) 75 MG tablet Take 1 tablet (75 mg total) by mouth daily.  30 tablet  6  . escitalopram (LEXAPRO) 10 MG tablet Take 10 mg by mouth daily.        Marland Kitchen esomeprazole (NEXIUM) 40 MG capsule Take 40 mg by mouth daily before breakfast.        . ezetimibe-simvastatin (VYTORIN) 10-20 MG per tablet Take 1 tablet by mouth at bedtime.       . fentaNYL (DURAGESIC - DOSED MCG/HR) 50 MCG/HR Place 1 patch onto the skin every 3 (three) days.      . Fluticasone-Salmeterol (ADVAIR) 250-50 MCG/DOSE AEPB Inhale 1 puff into the lungs every 12 (twelve) hours as needed. For shortness of breath       . glyBURIDE-metformin (GLUCOVANCE) 5-500 MG per tablet Take 2 tablets by mouth 2 (two)  times daily with a meal.      . hydrocortisone-pramoxine (ANALPRAM-HC) 2.5-1 % rectal cream Place rectally 2 (two) times daily.  30 g  1  . insulin aspart (NOVOLOG) 100 UNIT/ML injection Inject 4-8 Units into the skin 2 (two) times daily. If blood sugar>250, use 7-8 units. If if <200, use 4-5 units       . insulin glargine (LANTUS) 100 UNIT/ML injection Inject 30 Units into the skin at bedtime.       Marland Kitchen losartan (COZAAR) 25 MG tablet Take 1 tablet (25 mg total) by mouth daily.  30 tablet  11  . nitroGLYCERIN (NITROSTAT) 0.4 MG SL tablet Place 1 tablet (0.4 mg total) under the tongue every 5 (five) minutes as needed for chest pain.  25 tablet  3  . OVER THE COUNTER MEDICATION Place 1-2 drops into both eyes daily as needed. For dry/itchy eyes --saline drops       .  oxyCODONE-acetaminophen (PERCOCET) 10-325 MG per tablet Take 1 tablet by mouth every 4 (four) hours as needed. For pain       . torsemide (DEMADEX) 20 MG tablet Take 1 tablet (20 mg total) by mouth daily.  30 tablet  5  . warfarin (COUMADIN) 5 MG tablet Take 2.5-5 mg by mouth daily. Take 2.5 mg tues,and fri, take 5 mg all other days       . zolpidem (AMBIEN) 10 MG tablet Take 5 mg by mouth at bedtime as needed. For sleep       No current facility-administered medications on file prior to visit.    Allergies  Allergen Reactions  . Demerol Nausea And Vomiting  . Penicillins Hives  . Tape Other (See Comments)    "surgical tape tears skin up; paper tape is ok"  . Morphine And Related Itching  . Codeine Nausea And Vomiting    Past Medical History  Diagnosis Date  . CHF (congestive heart failure)     chronic systolic CHF  . Hypertension   . Atrial fibrillation   . Obesity   . Coronary artery disease     cardiac catheterization in 1997 -- Est. EF of 50%  . Fatigue   . Stress   . Chest pain   . Peptic ulcer 1985  . Angina   . GERD (gastroesophageal reflux disease)   . Depression   . Sinoatrial node dysfunction   . Gastric polyps   . Colon polyps 05/19/2009    Tubular Adenomous polyps  . Diverticulosis   . Endometrial cancer 1995  . Hypercholesteremia   . DVT (deep venous thrombosis) 1960's    ?RLE (03/16/2012)  . Pneumonia 1991  . Exertional dyspnea     "sometimes" (03/16/2012)  . Type II diabetes mellitus   . Anemia     "iron transfusions ?twice" (03/16/2012)  . Internal hemorrhoid, bleeding     "sometimes" (03/16/2012)  . Migraines     "when I was going thru menopause; none lately" (03/16/2012)  . Arthritis     "qwhere" (03/16/2012)  . Chronic back pain     Past Surgical History  Procedure Laterality Date  . Vesicovaginal fistula closure w/ tah  1995  . Joint replacement      LEFT HIP   02/2008  . Cholecystectomy  fall 2001  . Cardiac catheterization   02/21/96; 03/16/2012    Est. EF of 50%; "just to look" (03/16/2012)  . Coronary angioplasty with stent placement  02/15/11    "one"  . Coronary angioplasty  03/16/2012    "couldn't put stent in cause of where it is" (03/16/2012)  . Tonsillectomy  ~ 1962  . Abdominal hysterectomy  1995    "endometrial cancer" (03/16/2012)  . Dilation and curettage of uterus  1950's    "had probably 5; several miscarriages when I was younger"  . Total hip arthroplasty  02/2008    "left" (03/16/2012)  . Insert / replace / remove pacemaker  07/23/99    atrial insertion of the AV node -- Medtronic single chamber pacemaker   . Insert / replace / remove pacemaker  2012    "replaced" (03/16/2012)  . Ptca  02/2012    Balloon angioplast of jailed diagonal branch by prior LAD stent    History  Smoking status  . Former Smoker -- 1.00 packs/day for 35 years  . Types: Cigarettes  Smokeless tobacco  . Never Used    Comment: quit smoking "11/1989"    History  Alcohol Use  . Yes    Comment: "glass of wine q once in awhile"    Family History  Problem Relation Age of Onset  . Heart disease Mother   . Hypertension Mother     Reviw of Systems:  Reviewed in the HPI.  All other systems are negative.  Physical Exam: Blood pressure 145/78, pulse 78, height 5' 8.5" (1.74 m), weight 222 lb (100.699 kg). General: Well developed, well nourished, in no acute distress.  Head: Normocephalic, atraumatic, sclera non-icteric, mucus membranes are moist,   Neck: Supple. Carotids are 2 + without bruits. No JVD  Lungs: Clear bilaterally to auscultation.  Heart: regular rate.  normal  S1 S2. No murmurs, gallops or rubs.   Abdomen: nontender, the mass noticed at the previous visit has resolve.d  Msk:  Strength and tone are normal  Extremities: No clubbing or cyanosis. No edema.  Distal pedal pulses are 2+ and equal bilaterally.  Neuro: Alert and oriented X 3. Moves all extremities spontaneously.  Psych:   Responds to questions appropriately with a normal affect.  ECG: Nov. 18, 2014:  V pacig at 44.   Assessment / Plan:

## 2013-02-12 NOTE — Assessment & Plan Note (Signed)
Joyce Terry continue to have bleeding / loss of Hb.  She had PCI 2 years ago and has been on plavix and coumadin.   Given the recent date from [place], we will continue coumadin for her A-Fib and DC plavix.    Hopefully, this will improve her anemia.   I will see her again in 6 months.

## 2013-02-12 NOTE — Assessment & Plan Note (Signed)
She has a pacer.  Will DC plavix and continue coumadin.

## 2013-03-01 ENCOUNTER — Other Ambulatory Visit: Payer: Self-pay

## 2013-03-01 MED ORDER — CARVEDILOL 3.125 MG PO TABS: ORAL_TABLET | ORAL | Status: DC

## 2013-03-05 ENCOUNTER — Ambulatory Visit (INDEPENDENT_AMBULATORY_CARE_PROVIDER_SITE_OTHER): Payer: Medicare Other | Admitting: Internal Medicine

## 2013-03-05 ENCOUNTER — Encounter: Payer: Self-pay | Admitting: Internal Medicine

## 2013-03-05 VITALS — BP 133/80 | HR 79 | Ht 68.5 in | Wt 225.0 lb

## 2013-03-05 DIAGNOSIS — I2510 Atherosclerotic heart disease of native coronary artery without angina pectoris: Secondary | ICD-10-CM

## 2013-03-05 DIAGNOSIS — Z950 Presence of cardiac pacemaker: Secondary | ICD-10-CM

## 2013-03-05 DIAGNOSIS — I4891 Unspecified atrial fibrillation: Secondary | ICD-10-CM

## 2013-03-05 LAB — MDC_IDC_ENUM_SESS_TYPE_INCLINIC
Brady Statistic RV Percent Paced: 100 %
Date Time Interrogation Session: 20141209141251
Lead Channel Impedance Value: 0 Ohm
Lead Channel Impedance Value: 588 Ohm
Lead Channel Setting Pacing Pulse Width: 0.4 ms
Lead Channel Setting Sensing Sensitivity: 2.8 mV

## 2013-03-05 NOTE — Patient Instructions (Signed)
Your physician wants you to follow-up in: 12 months with Dr Court Joy will receive a reminder letter in the mail two months in advance. If you don't receive a letter, please call our office to schedule the follow-up appointment.   Remote monitoring is used to monitor your Pacemaker of ICD from home. This monitoring reduces the number of office visits required to check your device to one time per year. It allows [place] to keep an eye on the functioning of your device to ensure it is working properly. You are scheduled for a device check from home on 06/06/13 You may send your transmission at any time that day. If you have a wireless device, the transmission will be sent automatically. After your physician reviews your transmission, you will receive a postcard with your next transmission date.

## 2013-03-05 NOTE — Assessment & Plan Note (Signed)
her ventricular rate is well controlled. She will continue systemic anticoagulation for now.

## 2013-03-05 NOTE — Assessment & Plan Note (Signed)
She denies anginal symptoms. No change in medical therapy. 

## 2013-03-05 NOTE — Progress Notes (Signed)
HPI Joyce Terry returns today for followup. She is a 77 year old woman with known coronary disease status post catheterizations and stents, symptomatic bradycardia with complete heart block, status post permanent pacemaker insertion. The patient notes some lower GI bleeding and is currently undergoing anemia evaluation. She has not had chest pain or sob . No syncope. For the most part, her GI bleeding is bright red blood. Allergies  Allergen Reactions  . Demerol Nausea And Vomiting  . Penicillins Hives  . Tape Other (See Comments)    "surgical tape tears skin up; paper tape is ok"  . Morphine And Related Itching  . Codeine Nausea And Vomiting     Current Outpatient Prescriptions  Medication Sig Dispense Refill  . carvedilol (COREG) 3.125 MG tablet take 1 tablet twice a day  60 tablet  6  . escitalopram (LEXAPRO) 10 MG tablet Take 10 mg by mouth daily.        Marland Kitchen esomeprazole (NEXIUM) 40 MG capsule Take 40 mg by mouth daily before breakfast.        . ezetimibe-simvastatin (VYTORIN) 10-20 MG per tablet Take 1 tablet by mouth at bedtime.       . fentaNYL (DURAGESIC - DOSED MCG/HR) 50 MCG/HR Place 1 patch onto the skin every 3 (three) days.      . Fluticasone-Salmeterol (ADVAIR) 250-50 MCG/DOSE AEPB Inhale 1 puff into the lungs every 12 (twelve) hours as needed. For shortness of breath       . glyBURIDE-metformin (GLUCOVANCE) 5-500 MG per tablet Take 2 tablets by mouth 2 (two) times daily with a meal.      . hydrocortisone-pramoxine (ANALPRAM-HC) 2.5-1 % rectal cream Place rectally 2 (two) times daily.  30 g  1  . insulin aspart (NOVOLOG) 100 UNIT/ML injection Inject 4-8 Units into the skin 2 (two) times daily. If blood sugar>250, use 7-8 units. If if <200, use 4-5 units       . insulin glargine (LANTUS) 100 UNIT/ML injection Inject 30 Units into the skin at bedtime.       Marland Kitchen losartan (COZAAR) 25 MG tablet Take 1 tablet (25 mg total) by mouth daily.  30 tablet  11  . nitroGLYCERIN (NITROSTAT) 0.4  MG SL tablet Place 1 tablet (0.4 mg total) under the tongue every 5 (five) minutes as needed for chest pain.  25 tablet  3  . OVER THE COUNTER MEDICATION Place 1-2 drops into both eyes daily as needed. For dry/itchy eyes --saline drops       . oxyCODONE-acetaminophen (PERCOCET) 10-325 MG per tablet Take 1 tablet by mouth every 4 (four) hours as needed. For pain       . torsemide (DEMADEX) 20 MG tablet Take 1 tablet (20 mg total) by mouth daily.  30 tablet  5  . warfarin (COUMADIN) 5 MG tablet Take 2.5-5 mg by mouth daily. Take 2.5 mg tues,and fri, take 5 mg all other days       . zolpidem (AMBIEN) 10 MG tablet Take 5 mg by mouth at bedtime as needed. For sleep       No current facility-administered medications for this visit.     Past Medical History  Diagnosis Date  . CHF (congestive heart failure)     chronic systolic CHF  . Hypertension   . Atrial fibrillation   . Obesity   . Coronary artery disease     cardiac catheterization in 1997 -- Est. EF of 50%  . Fatigue   . Stress   .  Chest pain   . Peptic ulcer 1985  . Angina   . GERD (gastroesophageal reflux disease)   . Depression   . Sinoatrial node dysfunction   . Gastric polyps   . Colon polyps 05/19/2009    Tubular Adenomous polyps  . Diverticulosis   . Endometrial cancer 1995  . Hypercholesteremia   . DVT (deep venous thrombosis) 1960's    ?RLE (03/16/2012)  . Pneumonia 1991  . Exertional dyspnea     "sometimes" (03/16/2012)  . Type II diabetes mellitus   . Anemia     "iron transfusions ?twice" (03/16/2012)  . Internal hemorrhoid, bleeding     "sometimes" (03/16/2012)  . Migraines     "when I was going thru menopause; none lately" (03/16/2012)  . Arthritis     "qwhere" (03/16/2012)  . Chronic back pain     ROS:   All systems reviewed and negative except as noted in the HPI.   Past Surgical History  Procedure Laterality Date  . Vesicovaginal fistula closure w/ tah  1995  . Joint replacement      LEFT HIP    02/2008  . Cholecystectomy  fall 2001  . Cardiac catheterization  02/21/96; 03/16/2012    Est. EF of 50%; "just to look" (03/16/2012)  . Coronary angioplasty with stent placement  02/15/11    "one"  . Coronary angioplasty  03/16/2012    "couldn't put stent in cause of where it is" (03/16/2012)  . Tonsillectomy  ~ 1962  . Abdominal hysterectomy  1995    "endometrial cancer" (03/16/2012)  . Dilation and curettage of uterus  1950's    "had probably 5; several miscarriages when I was younger"  . Total hip arthroplasty  02/2008    "left" (03/16/2012)  . Insert / replace / remove pacemaker  07/23/99    atrial insertion of the AV node -- Medtronic single chamber pacemaker   . Insert / replace / remove pacemaker  2012    "replaced" (03/16/2012)  . Ptca  02/2012    Balloon angioplast of jailed diagonal branch by prior LAD stent     Family History  Problem Relation Age of Onset  . Heart disease Mother   . Hypertension Mother      History   Social History  . Marital Status: Divorced    Spouse Name: N/A    Number of Children: N/A  . Years of Education: N/A   Occupational History  . Retired    Social History Main Topics  . Smoking status: Former Smoker -- 1.00 packs/day for 35 years    Types: Cigarettes  . Smokeless tobacco: Never Used     Comment: quit smoking "11/1989"  . Alcohol Use: Yes     Comment: "glass of wine q once in awhile"  . Drug Use: No  . Sexual Activity: No   Other Topics Concern  . Not on file   Social History Narrative  . No narrative on file     BP 133/80  Pulse 79  Ht 5' 8.5" (1.74 m)  Wt 225 lb (102.059 kg)  BMI 33.71 kg/m2  Physical Exam:  Well appearing 77 year old woman, NAD HEENT: Unremarkable Neck:  No JVD, no thyromegally Lungs:  Clear with no wheezes, rales, or rhonchi. HEART:  Regular rate rhythm, no murmurs, no rubs, no clicks Abd:  soft, positive bowel sounds, no organomegally, no rebound, no guarding Ext:  2 plus pulses, no  edema, no cyanosis, no clubbing Skin:  No rashes no nodules  Neuro:  CN II through XII intact, motor grossly intact   DEVICE  Normal device function.  See PaceArt for details.   Assess/Plan:

## 2013-03-05 NOTE — Assessment & Plan Note (Signed)
Her Medtronic single-chamber pacemaker is working normally. We'll plan to recheck in several months. 

## 2013-03-08 ENCOUNTER — Encounter: Payer: Self-pay | Admitting: Internal Medicine

## 2013-03-26 ENCOUNTER — Other Ambulatory Visit: Payer: Self-pay

## 2013-03-26 ENCOUNTER — Encounter: Payer: Self-pay | Admitting: Internal Medicine

## 2013-03-26 ENCOUNTER — Ambulatory Visit (INDEPENDENT_AMBULATORY_CARE_PROVIDER_SITE_OTHER): Payer: Medicare Other | Admitting: Internal Medicine

## 2013-03-26 VITALS — BP 120/68 | HR 80 | Ht 68.5 in | Wt 226.6 lb

## 2013-03-26 DIAGNOSIS — D509 Iron deficiency anemia, unspecified: Secondary | ICD-10-CM

## 2013-03-26 DIAGNOSIS — K219 Gastro-esophageal reflux disease without esophagitis: Secondary | ICD-10-CM

## 2013-03-26 DIAGNOSIS — K648 Other hemorrhoids: Secondary | ICD-10-CM

## 2013-03-26 DIAGNOSIS — Z860100 Personal history of colon polyps, unspecified: Secondary | ICD-10-CM

## 2013-03-26 MED ORDER — TORSEMIDE 20 MG PO TABS: 20.0000 mg | ORAL_TABLET | Freq: Every day | ORAL | Status: DC

## 2013-03-26 MED ORDER — HYDROCORTISONE ACETATE 25 MG RE SUPP: 25.0000 mg | Freq: Every day | RECTAL | Status: DC

## 2013-03-26 NOTE — Progress Notes (Signed)
HISTORY OF PRESENT ILLNESS:  Joyce Terry is a 77 y.o. female  With MULTIPLE SIGNIFICANT medical problems as listed below. The patient has been seen in this office previously regarding GERD complicated by peptic stricture which has required esophageal dilation. As well history of adenomatous colon polyps. Finally, history of recurrent iron deficiency anemia, felt in part, to be related to known gastrointestinal AVMs. Most recent GI evaluations in 2011 as previously documented. She receives intermittent infusions of iron to manage her iron deficiency anemia. Last seen by myself June 2013 and our extender August 2014. Please refer to those dictations. She presents today with chief complaint of minor rectal bleeding of several weeks duration. She had been on Coumadin and Plavix. Plavix discontinued by her cardiologist. Bleeding is described as red or pink. Mostly spotting of blood on the undergarments for which she now uses protective tissue. Minor bleeding on tissue with defecation. Nothing more. Some minor rectal discomfort. No abdominal pain. GI review of systems otherwise negative.  REVIEW OF SYSTEMS:  All non-GI ROS negative except for back pain, sinus and allergy trouble  Past Medical History  Diagnosis Date  . CHF (congestive heart failure)     chronic systolic CHF  . Hypertension   . Atrial fibrillation   . Obesity   . Coronary artery disease     cardiac catheterization in 1997 -- Est. EF of 50%  . Fatigue   . Stress   . Chest pain   . Peptic ulcer 1985  . Angina   . GERD (gastroesophageal reflux disease)   . Depression   . Sinoatrial node dysfunction   . Gastric polyps   . Colon polyps 05/19/2009    Tubular Adenomous polyps  . Diverticulosis   . Endometrial cancer 1995  . Hypercholesteremia   . DVT (deep venous thrombosis) 1960's    ?RLE (03/16/2012)  . Pneumonia 1991  . Exertional dyspnea     "sometimes" (03/16/2012)  . Type II diabetes mellitus   . Anemia     "iron  transfusions ?twice" (03/16/2012)  . Internal hemorrhoid, bleeding     "sometimes" (03/16/2012)  . Migraines     "when I was going thru menopause; none lately" (03/16/2012)  . Arthritis     "qwhere" (03/16/2012)  . Chronic back pain     Past Surgical History  Procedure Laterality Date  . Vesicovaginal fistula closure w/ tah  1995  . Joint replacement      LEFT HIP   02/2008  . Cholecystectomy  fall 2001  . Cardiac catheterization  02/21/96; 03/16/2012    Est. EF of 50%; "just to look" (03/16/2012)  . Coronary angioplasty with stent placement  02/15/11    "one"  . Coronary angioplasty  03/16/2012    "couldn't put stent in cause of where it is" (03/16/2012)  . Tonsillectomy  ~ 1962  . Abdominal hysterectomy  1995    "endometrial cancer" (03/16/2012)  . Dilation and curettage of uterus  1950's    "had probably 5; several miscarriages when I was younger"  . Total hip arthroplasty  02/2008    "left" (03/16/2012)  . Insert / replace / remove pacemaker  07/23/99    atrial insertion of the AV node -- Medtronic single chamber pacemaker   . Insert / replace / remove pacemaker  2012    "replaced" (03/16/2012)  . Ptca  02/2012    Balloon angioplast of jailed diagonal branch by prior LAD stent    Social History Joyce Terry  reports that she has quit smoking. Her smoking use included Cigarettes. She has a 35 pack-year smoking history. She has never used smokeless tobacco. She reports that she drinks alcohol. She reports that she does not use illicit drugs.  family history includes Heart disease in her mother; Hypertension in her mother.  Allergies  Allergen Reactions  . Demerol Nausea And Vomiting  . Penicillins Hives  . Tape Other (See Comments)    "surgical tape tears skin up; paper tape is ok"  . Morphine And Related Itching  . Codeine Nausea And Vomiting       PHYSICAL EXAMINATION:  Vital signs: BP 120/68  Pulse 80  Ht 5' 8.5" (1.74 m)  Wt 226 lb 9.6 oz (102.785 kg)   BMI 33.95 kg/m2  Constitutional: Elderly, chronically illl-appearing, but in no acute distress Psychiatric: alert and oriented x3, cooperative Eyes: extraocular movements intact, anicteric, conjunctiva pink Mouth: oral pharynx moist, no lesions Neck: supple no lymphadenopathy Cardiovascular: heart regular rate and rhythm, no murmur Lungs: clear to auscultation bilaterally Abdomen: soft, obese, nontender, nondistended, no obvious ascites, no peritoneal signs, normal bowel sounds, no organomegaly Rectal: Obvious prolapsed internal hemorrhoid with friability and bleeding Extremities: no lower extremity edema bilaterally Skin: no lesions on visible extremities Neuro: No focal deficits. No asterixis.   ASSESSMENT:  #1. Rectal bleeding, minor. Etiology prolapsed hemorrhoid #2. GERD complicated by peptic stricture. Currently asymptomatic on PPI #3. History of colon polyps. Last colonoscopy 2011. No plans for additional routine surveillance at this time due to multiple comorbidities #4. Her deficiency anemia. In part, felt to be due to known GI AVMs. Receiving iron infusion therapy with good response #5. Multiple significant medical problems. Ongoing  PLAN:  #1. Prescribe Anusol-HC suppositories. To be used at night until hemorrhoidal issues resolved #2. Problems with hemorrhoids worsen or persist despite medical therapy, surgical referral #3. Continue PPI #4. Resume general medical care with Dr. Jacky Kindle

## 2013-03-26 NOTE — Patient Instructions (Signed)
We have sent the following medications to your pharmacy for you to pick up at your convenience:  [facility] suppositories  Use the suppositories, one at night, for a month.    Please follow up with Dr. Marina Goodell if this does not resolve.

## 2013-04-23 ENCOUNTER — Other Ambulatory Visit: Payer: Self-pay

## 2013-04-23 DIAGNOSIS — I2510 Atherosclerotic heart disease of native coronary artery without angina pectoris: Secondary | ICD-10-CM

## 2013-04-23 MED ORDER — LOSARTAN POTASSIUM 25 MG PO TABS: 25.0000 mg | ORAL_TABLET | Freq: Every day | ORAL | Status: DC

## 2013-06-06 ENCOUNTER — Ambulatory Visit (INDEPENDENT_AMBULATORY_CARE_PROVIDER_SITE_OTHER): Payer: Medicare Other | Admitting: *Deleted

## 2013-06-06 DIAGNOSIS — I4891 Unspecified atrial fibrillation: Secondary | ICD-10-CM

## 2013-06-06 DIAGNOSIS — I495 Sick sinus syndrome: Secondary | ICD-10-CM

## 2013-06-06 DIAGNOSIS — Z950 Presence of cardiac pacemaker: Secondary | ICD-10-CM

## 2013-06-10 ENCOUNTER — Telehealth: Payer: Self-pay | Admitting: Internal Medicine

## 2013-06-10 NOTE — Telephone Encounter (Signed)
Pt states she has been having rectal bleeding, was seen for this several months ago but it continues. Pt was given anusol supp but had a hard time using these because they were painful. Pt requesting to be seen. Pt scheduled to see Tye Savoy NP 06/12/13@11am . Pt aware of appt.

## 2013-06-12 ENCOUNTER — Ambulatory Visit: Payer: Medicare Other | Admitting: Nurse Practitioner

## 2013-06-13 ENCOUNTER — Encounter: Payer: Self-pay | Admitting: Physician Assistant

## 2013-06-13 ENCOUNTER — Ambulatory Visit (INDEPENDENT_AMBULATORY_CARE_PROVIDER_SITE_OTHER): Payer: Medicare Other | Admitting: Physician Assistant

## 2013-06-13 ENCOUNTER — Telehealth: Payer: Self-pay | Admitting: *Deleted

## 2013-06-13 ENCOUNTER — Other Ambulatory Visit (INDEPENDENT_AMBULATORY_CARE_PROVIDER_SITE_OTHER): Payer: Medicare Other

## 2013-06-13 VITALS — BP 112/58 | HR 64 | Ht 68.5 in | Wt 222.4 lb

## 2013-06-13 DIAGNOSIS — K649 Unspecified hemorrhoids: Secondary | ICD-10-CM

## 2013-06-13 DIAGNOSIS — K648 Other hemorrhoids: Secondary | ICD-10-CM

## 2013-06-13 DIAGNOSIS — Z7901 Long term (current) use of anticoagulants: Secondary | ICD-10-CM

## 2013-06-13 DIAGNOSIS — I2510 Atherosclerotic heart disease of native coronary artery without angina pectoris: Secondary | ICD-10-CM

## 2013-06-13 LAB — CBC WITH DIFFERENTIAL/PLATELET
BASOS ABS: 0 10*3/uL (ref 0.0–0.1)
Basophils Relative: 0.2 % (ref 0.0–3.0)
EOS ABS: 0 10*3/uL (ref 0.0–0.7)
EOS PCT: 0 % (ref 0.0–5.0)
HEMATOCRIT: 34.3 % — AB (ref 36.0–46.0)
Hemoglobin: 10.8 g/dL — ABNORMAL LOW (ref 12.0–15.0)
LYMPHS ABS: 1.5 10*3/uL (ref 0.7–4.0)
Lymphocytes Relative: 8.1 % — ABNORMAL LOW (ref 12.0–46.0)
MCHC: 31.4 g/dL (ref 30.0–36.0)
MCV: 72.1 fl — AB (ref 78.0–100.0)
MONO ABS: 1.3 10*3/uL — AB (ref 0.1–1.0)
Monocytes Relative: 6.6 % (ref 3.0–12.0)
NEUTROS PCT: 85.1 % — AB (ref 43.0–77.0)
Neutro Abs: 16 10*3/uL — ABNORMAL HIGH (ref 1.4–7.7)
PLATELETS: 317 10*3/uL (ref 150.0–400.0)
RBC: 4.76 Mil/uL (ref 3.87–5.11)
RDW: 16.7 % — AB (ref 11.5–14.6)
WBC: 18.8 10*3/uL — AB (ref 4.5–10.5)

## 2013-06-13 NOTE — Patient Instructions (Signed)
Please go to the basement level to have your labs drawn.  We have given you samples of Ana-LEX cream- Hydrocortisone 2% and Lidocaine HCL 2 %. Use these samples rectally on the hemorrhoids 3-4 times daily. If this helps call and we can do a prescription.  We will call you with an appointment to consult with a surgeon at [facility].

## 2013-06-13 NOTE — Telephone Encounter (Signed)
Per Amy, I called the patient to let her know that her white count was high.Per Amy, we don't know why that is and suggest she see Dr. Reynaldo Minium. I told her that I just faxed her labs and Amy's office note from today to Dr. Burnard Bunting.  I advised her to call them in the morning and make an appointment to be seen.  She said she hasn't been feeling well for the last few days.  She thanked me for calling to let her know and will call them first thing in the morning.

## 2013-06-13 NOTE — Progress Notes (Signed)
Subjective:    Patient ID: Joyce Terry, female    DOB: 02-28-1936, 78 y.o.   MRN: [id number]  HPI  Joyce Terry is a very nice 78 year old white female known to Dr. Scarlette Shorts. She has multiple  medical problems including atrial fibrillation for which she is on Coumadin, congestive heart failure, and pacemaker placement. She is also diabetic and hypertensive, has history of chronic GERD with peptic stricture. History of coronary artery disease status post stenting. She last had colonoscopy in 2011 at that time had 4 polyps removed was noted to have moderate diverticulosis in the left colon and a small AVM in the cecum as well as moderate internal hemorrhoids. Path on the polyps consistent with a serrated adenoma and a tubular adenoma. She had been seen by Dr. Henrene Pastor in December of 2014 with complaints of rectal bleeding secondary to a prolapsed internal hemorrhoid. She has been treated with local measures including steroid cream . and Anusol suppositories without any benefit.  She comes back in today for evaluation stating that she's been bleeding on the daily basis for several months. She has ongoing mild rectal discomfort. She has had some problems with constipation and straining as well as she has to take pain medications for her back. She has been wearing a pad because she gets leakage of blood and occasional seepage of stool.    Review of Systems  Constitutional: Negative.   HENT: Negative.   Eyes: Negative.   Respiratory: Negative.   Gastrointestinal: Positive for constipation and anal bleeding.  Endocrine: Negative.   Genitourinary: Negative.   Musculoskeletal: Negative.   Allergic/Immunologic: Negative.   Neurological: Negative.   Hematological: Negative.   Psychiatric/Behavioral: Negative.    Outpatient Encounter Prescriptions as of 06/13/2013  Medication Sig  . carvedilol (COREG) 3.125 MG tablet take 1 tablet twice a day  . escitalopram (LEXAPRO) 10 MG tablet Take 10 mg by mouth  daily.    Marland Kitchen esomeprazole (NEXIUM) 40 MG capsule Take 40 mg by mouth daily before breakfast.    . ezetimibe-simvastatin (VYTORIN) 10-20 MG per tablet Take 1 tablet by mouth at bedtime.   . fentaNYL (DURAGESIC - DOSED MCG/HR) 50 MCG/HR Place 1 patch onto the skin every 3 (three) days.  . Fluticasone-Salmeterol (ADVAIR) 250-50 MCG/DOSE AEPB Inhale 1 puff into the lungs every 12 (twelve) hours as needed. For shortness of breath   . glyBURIDE-metformin (GLUCOVANCE) 5-500 MG per tablet Take 2 tablets by mouth 2 (two) times daily with a meal.  . insulin aspart (NOVOLOG) 100 UNIT/ML injection Inject 4-8 Units into the skin 2 (two) times daily. If blood sugar>250, use 7-8 units. If if <200, use 4-5 units   . insulin glargine (LANTUS) 100 UNIT/ML injection Inject 30 Units into the skin at bedtime.   Marland Kitchen losartan (COZAAR) 25 MG tablet Take 1 tablet (25 mg total) by mouth daily.  . nitroGLYCERIN (NITROSTAT) 0.4 MG SL tablet Place 1 tablet (0.4 mg total) under the tongue every 5 (five) minutes as needed for chest pain.  Marland Kitchen OVER THE COUNTER MEDICATION Place 1-2 drops into both eyes daily as needed. For dry/itchy eyes --saline drops   . oxyCODONE-acetaminophen (PERCOCET) 10-325 MG per tablet Take 1 tablet by mouth every 4 (four) hours as needed. For pain   . pantoprazole (PROTONIX) 40 MG tablet   . torsemide (DEMADEX) 20 MG tablet Take 1 tablet (20 mg total) by mouth daily.  Marland Kitchen warfarin (COUMADIN) 5 MG tablet Take 2.5-5 mg by mouth daily. Take 2.5 mg  tues,and fri, take 5 mg all other days   . zolpidem (AMBIEN) 10 MG tablet Take 5 mg by mouth at bedtime as needed. For sleep  . [DISCONTINUED] hydrocortisone (ANUSOL-HC) 25 MG suppository Place 1 suppository (25 mg total) rectally at bedtime.  . [DISCONTINUED] hydrocortisone-pramoxine (ANALPRAM-HC) 2.5-1 % rectal cream Place rectally 2 (two) times daily.   Allergies  Allergen Reactions  . Demerol Nausea And Vomiting  . Penicillins Hives  . Tape Other (See Comments)      "surgical tape tears skin up; paper tape is ok"  . Morphine And Related Itching  . Codeine Nausea And Vomiting   Patient Active Problem List   Diagnosis Date Noted  . Angina, class III 03/17/2012  . Angina decubitus 02/28/2012  . Cough due to ACE inhibitor 06/01/2011  . Sinoatrial node dysfunction   . CAD (coronary artery disease) 02/15/2011  . Dyspnea 01/26/2011  . CHF (congestive heart failure) 09/20/2010  . OBESITY, MORBID 03/03/2010  . Atrial fibrillation 03/03/2010  . PPM-Medtronic 03/03/2010  . DM 03/04/2009  . ANEMIA-IRON DEFICIENCY 03/04/2009  . OTHER AND UNSPECIFIED COAGULATION DEFECTS 03/04/2009  . GERD 03/04/2009  . PERSONAL HX COLONIC POLYPS 03/04/2009  . RECTAL BLEEDING 08/22/2007  . DYSPHAGIA 08/22/2007   History  Substance Use Topics  . Smoking status: Former Smoker -- 1.00 packs/day for 35 years    Types: Cigarettes  . Smokeless tobacco: Never Used     Comment: quit smoking "11/1989"  . Alcohol Use: Yes     Comment: "glass of wine q once in awhile"   family history includes Heart disease in her mother; Hypertension in her mother.     Objective:   Physical Exam  well-developed elderly white female in no acute distress, pleasant blood pressure 112/58 pulse 64 height 5 foot 8 weight 222. HEENT; nontraumatic normocephalic EOMI PERRLA sclera anicteric, Neck ;supple no JVD, Cardiovascular ;regular rate and rhythm with S1-S2 does have a pacemaker in the chest wall, Pulmonary; clear, Abdomen; soft nontender nondistended bowel sounds are active no mass or hepatosplenomegaly, Rectal; exam she has a large prolapsed internal hemorrhoid which is friable and oozing blood tender nonthrombosed, Extremities; no clubbing cyanosis or edema skin warm and dry, Psych ;mood and affect appropriate        Assessment & Plan:  #16 78 year old female with grade 3 internal hemorrhoids with ongoing daily rectal bleeding which is exacerbated by chronic anticoagulation with  Coumadin. #2 history of adenomatous polyps and serrated adenoma on colonoscopy 2011-planned for 5 year interval followup #3 atrial fibrillation #4 chronic anticoagulation with Coumadin #5 coronary artery disease status post stent #6 status post pacemaker #7 congestive heart failure #8 adult-onset diabetes mellitus #9 GERD with history of peptic stricture #10-patient has previous history of iron deficiency anemia secondary to GI blood loss presumably on the basis of AVMs. She did require transfusions in 2012  Plan; CBC today Have given her samples of Analex cream  which is 2% hydrocortisone/ 2% lidocaine mixture to use 3-4 times daily Start MiraLax 17 g in 8 ounces of water daily 2 avoid straining Will refer to surgery for definitive management of her prolapsed hemorrhoids

## 2013-06-14 NOTE — Progress Notes (Signed)
Agree with assessment and plans 

## 2013-06-17 ENCOUNTER — Other Ambulatory Visit: Payer: Self-pay | Admitting: Internal Medicine

## 2013-06-17 ENCOUNTER — Ambulatory Visit
Admission: RE | Admit: 2013-06-17 | Discharge: 2013-06-17 | Disposition: A | Discharge status: Home / Self Care | Payer: Medicare Other | Source: Ambulatory Visit | Attending: Internal Medicine | Admitting: Internal Medicine

## 2013-06-17 DIAGNOSIS — E669 Obesity, unspecified: Secondary | ICD-10-CM

## 2013-06-17 DIAGNOSIS — K219 Gastro-esophageal reflux disease without esophagitis: Secondary | ICD-10-CM

## 2013-06-17 DIAGNOSIS — E119 Type 2 diabetes mellitus without complications: Secondary | ICD-10-CM

## 2013-06-17 DIAGNOSIS — J45909 Unspecified asthma, uncomplicated: Secondary | ICD-10-CM

## 2013-06-17 MED ORDER — IOHEXOL 300 MG/ML  SOLN
80.0000 mL | Freq: Once | INTRAMUSCULAR | Status: AC | PRN
  Administered 2013-06-17: 80 mL via INTRAVENOUS

## 2013-06-19 ENCOUNTER — Ambulatory Visit (INDEPENDENT_AMBULATORY_CARE_PROVIDER_SITE_OTHER): Payer: PRIVATE HEALTH INSURANCE | Admitting: General Surgery

## 2013-06-19 ENCOUNTER — Other Ambulatory Visit: Payer: Self-pay | Admitting: Internal Medicine

## 2013-06-19 DIAGNOSIS — R9389 Abnormal findings on diagnostic imaging of other specified body structures: Secondary | ICD-10-CM

## 2013-06-19 DIAGNOSIS — R935 Abnormal findings on diagnostic imaging of other abdominal regions, including retroperitoneum: Secondary | ICD-10-CM

## 2013-06-20 LAB — MDC_IDC_ENUM_SESS_TYPE_REMOTE
Battery Remaining Longevity: 90 mo
Battery Voltage: 2.79 V
Brady Statistic RV Percent Paced: 100 %
Lead Channel Setting Pacing Pulse Width: 0.4 ms
Lead Channel Setting Sensing Sensitivity: 2.8 mV
MDC IDC MSMT BATTERY IMPEDANCE: 303 Ohm
MDC IDC MSMT LEADCHNL RA IMPEDANCE VALUE: 0 Ohm
MDC IDC MSMT LEADCHNL RV IMPEDANCE VALUE: 588 Ohm
MDC IDC MSMT LEADCHNL RV PACING THRESHOLD AMPLITUDE: 0.5 V
MDC IDC MSMT LEADCHNL RV PACING THRESHOLD PULSEWIDTH: 0.4 ms
MDC IDC SESS DTM: 20150312140941
MDC IDC SET LEADCHNL RV PACING AMPLITUDE: 2.5 V

## 2013-06-23 ENCOUNTER — Encounter (HOSPITAL_COMMUNITY): Payer: Self-pay | Admitting: Emergency Medicine

## 2013-06-23 ENCOUNTER — Observation Stay (HOSPITAL_COMMUNITY): Payer: Medicare Other

## 2013-06-23 ENCOUNTER — Inpatient Hospital Stay (HOSPITAL_COMMUNITY)
Admission: EM | Admit: 2013-06-23 | Discharge: 2013-06-26 | DRG: 640 | Disposition: A | Discharge status: Home Health | Payer: Medicare Other | Attending: Internal Medicine | Admitting: Internal Medicine

## 2013-06-23 DIAGNOSIS — I2510 Atherosclerotic heart disease of native coronary artery without angina pectoris: Secondary | ICD-10-CM | POA: Diagnosis present

## 2013-06-23 DIAGNOSIS — I4891 Unspecified atrial fibrillation: Secondary | ICD-10-CM

## 2013-06-23 DIAGNOSIS — Z794 Long term (current) use of insulin: Secondary | ICD-10-CM

## 2013-06-23 DIAGNOSIS — K219 Gastro-esophageal reflux disease without esophagitis: Secondary | ICD-10-CM | POA: Diagnosis present

## 2013-06-23 DIAGNOSIS — Z8542 Personal history of malignant neoplasm of other parts of uterus: Secondary | ICD-10-CM

## 2013-06-23 DIAGNOSIS — F329 Major depressive disorder, single episode, unspecified: Secondary | ICD-10-CM | POA: Diagnosis present

## 2013-06-23 DIAGNOSIS — Z86718 Personal history of other venous thrombosis and embolism: Secondary | ICD-10-CM

## 2013-06-23 DIAGNOSIS — Z79899 Other long term (current) drug therapy: Secondary | ICD-10-CM

## 2013-06-23 DIAGNOSIS — I5022 Chronic systolic (congestive) heart failure: Secondary | ICD-10-CM | POA: Diagnosis present

## 2013-06-23 DIAGNOSIS — R791 Abnormal coagulation profile: Secondary | ICD-10-CM | POA: Diagnosis present

## 2013-06-23 DIAGNOSIS — I509 Heart failure, unspecified: Secondary | ICD-10-CM | POA: Diagnosis present

## 2013-06-23 DIAGNOSIS — E860 Dehydration: Principal | ICD-10-CM | POA: Diagnosis present

## 2013-06-23 DIAGNOSIS — Z950 Presence of cardiac pacemaker: Secondary | ICD-10-CM

## 2013-06-23 DIAGNOSIS — E119 Type 2 diabetes mellitus without complications: Secondary | ICD-10-CM | POA: Diagnosis present

## 2013-06-23 DIAGNOSIS — E7800 Pure hypercholesterolemia, unspecified: Secondary | ICD-10-CM | POA: Diagnosis present

## 2013-06-23 DIAGNOSIS — Z96649 Presence of unspecified artificial hip joint: Secondary | ICD-10-CM

## 2013-06-23 DIAGNOSIS — J189 Pneumonia, unspecified organism: Secondary | ICD-10-CM | POA: Diagnosis present

## 2013-06-23 DIAGNOSIS — I10 Essential (primary) hypertension: Secondary | ICD-10-CM | POA: Diagnosis present

## 2013-06-23 DIAGNOSIS — E876 Hypokalemia: Secondary | ICD-10-CM | POA: Diagnosis not present

## 2013-06-23 DIAGNOSIS — E669 Obesity, unspecified: Secondary | ICD-10-CM | POA: Diagnosis present

## 2013-06-23 DIAGNOSIS — Z87891 Personal history of nicotine dependence: Secondary | ICD-10-CM

## 2013-06-23 DIAGNOSIS — Z9861 Coronary angioplasty status: Secondary | ICD-10-CM

## 2013-06-23 DIAGNOSIS — F3289 Other specified depressive episodes: Secondary | ICD-10-CM | POA: Diagnosis present

## 2013-06-23 LAB — URINALYSIS, ROUTINE W REFLEX MICROSCOPIC
BILIRUBIN URINE: NEGATIVE
Glucose, UA: NEGATIVE mg/dL
KETONES UR: 15 mg/dL — AB
Leukocytes, UA: NEGATIVE
NITRITE: NEGATIVE
PROTEIN: NEGATIVE mg/dL
Specific Gravity, Urine: 1.018 (ref 1.005–1.030)
UROBILINOGEN UA: 0.2 mg/dL (ref 0.0–1.0)
pH: 5 (ref 5.0–8.0)

## 2013-06-23 LAB — URINE MICROSCOPIC-ADD ON

## 2013-06-23 LAB — COMPREHENSIVE METABOLIC PANEL
ALK PHOS: 70 U/L (ref 39–117)
ALT: 12 U/L (ref 0–35)
AST: 19 U/L (ref 0–37)
Albumin: 2.7 g/dL — ABNORMAL LOW (ref 3.5–5.2)
BUN: 14 mg/dL (ref 6–23)
CALCIUM: 7.5 mg/dL — AB (ref 8.4–10.5)
CO2: 27 meq/L (ref 19–32)
Chloride: 98 mEq/L (ref 96–112)
Creatinine, Ser: 1.18 mg/dL — ABNORMAL HIGH (ref 0.50–1.10)
GFR, EST AFRICAN AMERICAN: 51 mL/min — AB (ref >90)
GFR, EST NON AFRICAN AMERICAN: 44 mL/min — AB (ref >90)
GLUCOSE: 206 mg/dL — AB (ref 70–99)
POTASSIUM: 4 meq/L (ref 3.7–5.3)
SODIUM: 141 meq/L (ref 137–147)
Total Bilirubin: 0.3 mg/dL (ref 0.3–1.2)
Total Protein: 6.7 g/dL (ref 6.0–8.3)

## 2013-06-23 LAB — CBC WITH DIFFERENTIAL/PLATELET
Basophils Absolute: 0 10*3/uL (ref 0.0–0.1)
Basophils Relative: 0 % (ref 0–1)
EOS PCT: 0 % (ref 0–5)
Eosinophils Absolute: 0 10*3/uL (ref 0.0–0.7)
HCT: 33 % — ABNORMAL LOW (ref 36.0–46.0)
HEMOGLOBIN: 10.4 g/dL — AB (ref 12.0–15.0)
LYMPHS ABS: 0.7 10*3/uL (ref 0.7–4.0)
LYMPHS PCT: 7 % — AB (ref 12–46)
MCH: 22.8 pg — AB (ref 26.0–34.0)
MCHC: 31.5 g/dL (ref 30.0–36.0)
MCV: 72.4 fL — AB (ref 78.0–100.0)
MONOS PCT: 6 % (ref 3–12)
Monocytes Absolute: 0.6 10*3/uL (ref 0.1–1.0)
Neutro Abs: 9 10*3/uL — ABNORMAL HIGH (ref 1.7–7.7)
Neutrophils Relative %: 87 % — ABNORMAL HIGH (ref 43–77)
PLATELETS: 436 10*3/uL — AB (ref 150–400)
RBC: 4.56 MIL/uL (ref 3.87–5.11)
RDW: 16.6 % — ABNORMAL HIGH (ref 11.5–15.5)
WBC: 10.3 10*3/uL (ref 4.0–10.5)

## 2013-06-23 LAB — CBG MONITORING, ED: GLUCOSE-CAPILLARY: 140 mg/dL — AB (ref 70–99)

## 2013-06-23 LAB — TROPONIN I: Troponin I: <0.3 ng/mL (ref <0.30)

## 2013-06-23 LAB — PROTIME-INR
INR: 5.6 (ref 0.00–1.49)
Prothrombin Time: 48.4 seconds — ABNORMAL HIGH (ref 11.6–15.2)

## 2013-06-23 LAB — GLUCOSE, CAPILLARY
Glucose-Capillary: 107 mg/dL — ABNORMAL HIGH (ref 70–99)
Glucose-Capillary: 133 mg/dL — ABNORMAL HIGH (ref 70–99)

## 2013-06-23 LAB — LIPASE, BLOOD: Lipase: 12 U/L (ref 11–59)

## 2013-06-23 MED ORDER — WARFARIN - PHARMACIST DOSING INPATIENT: Freq: Every day | Status: DC

## 2013-06-23 MED ORDER — OXYCODONE-ACETAMINOPHEN 5-325 MG PO TABS
1.0000 | ORAL_TABLET | Freq: Three times a day (TID) | ORAL | Status: DC | PRN
Start: 2013-06-23 — End: 2013-06-26
  Administered 2013-06-23 – 2013-06-26 (×7): 1 via ORAL
  Filled 2013-06-23 (×8): qty 1

## 2013-06-23 MED ORDER — METFORMIN HCL 500 MG PO TABS
1000.0000 mg | ORAL_TABLET | Freq: Two times a day (BID) | ORAL | Status: DC
  Administered 2013-06-24 – 2013-06-26 (×5): 1000 mg via ORAL
  Filled 2013-06-23 (×7): qty 2

## 2013-06-23 MED ORDER — CARVEDILOL 3.125 MG PO TABS
3.1250 mg | ORAL_TABLET | Freq: Two times a day (BID) | ORAL | Status: DC
  Administered 2013-06-24 – 2013-06-26 (×5): 3.125 mg via ORAL
  Filled 2013-06-23 (×7): qty 1

## 2013-06-23 MED ORDER — ONDANSETRON HCL 4 MG/2ML IJ SOLN
4.0000 mg | Freq: Four times a day (QID) | INTRAMUSCULAR | Status: DC
  Administered 2013-06-23 – 2013-06-25 (×8): 4 mg via INTRAVENOUS
  Filled 2013-06-23 (×8): qty 2

## 2013-06-23 MED ORDER — INSULIN ASPART 100 UNIT/ML ~~LOC~~ SOLN: 0.0000 [IU] | Freq: Every day | SUBCUTANEOUS | Status: DC

## 2013-06-23 MED ORDER — SODIUM CHLORIDE 0.9 % IJ SOLN
3.0000 mL | Freq: Two times a day (BID) | INTRAMUSCULAR | Status: DC
  Administered 2013-06-23 – 2013-06-25 (×4): 3 mL via INTRAVENOUS

## 2013-06-23 MED ORDER — ONDANSETRON HCL 4 MG/2ML IJ SOLN
4.0000 mg | Freq: Once | INTRAMUSCULAR | Status: AC
Start: 2013-06-23 — End: 2013-06-23
  Administered 2013-06-23: 4 mg via INTRAVENOUS
  Filled 2013-06-23: qty 2

## 2013-06-23 MED ORDER — INSULIN ASPART 100 UNIT/ML ~~LOC~~ SOLN
0.0000 [IU] | Freq: Three times a day (TID) | SUBCUTANEOUS | Status: DC
  Administered 2013-06-24: 5 [IU] via SUBCUTANEOUS
  Administered 2013-06-25 – 2013-06-26 (×5): 3 [IU] via SUBCUTANEOUS

## 2013-06-23 MED ORDER — METOCLOPRAMIDE HCL 5 MG/ML IJ SOLN
5.0000 mg | Freq: Once | INTRAMUSCULAR | Status: AC
  Administered 2013-06-23: 5 mg via INTRAVENOUS
  Filled 2013-06-23: qty 2

## 2013-06-23 MED ORDER — NITROGLYCERIN 0.4 MG SL SUBL: 0.4000 mg | SUBLINGUAL_TABLET | SUBLINGUAL | Status: DC | PRN

## 2013-06-23 MED ORDER — GLYBURIDE-METFORMIN 5-500 MG PO TABS: 2.0000 | ORAL_TABLET | Freq: Two times a day (BID) | ORAL | Status: DC

## 2013-06-23 MED ORDER — FENTANYL 25 MCG/HR TD PT72
50.0000 ug | MEDICATED_PATCH | TRANSDERMAL | Status: DC
  Administered 2013-06-23: 50 ug via TRANSDERMAL
  Filled 2013-06-23: qty 2

## 2013-06-23 MED ORDER — GLYBURIDE 5 MG PO TABS
10.0000 mg | ORAL_TABLET | Freq: Two times a day (BID) | ORAL | Status: DC
  Administered 2013-06-24 – 2013-06-26 (×5): 10 mg via ORAL
  Filled 2013-06-23 (×7): qty 2

## 2013-06-23 MED ORDER — EZETIMIBE-SIMVASTATIN 10-20 MG PO TABS
1.0000 | ORAL_TABLET | Freq: Every day | ORAL | Status: DC
  Administered 2013-06-23 – 2013-06-25 (×3): 1 via ORAL
  Filled 2013-06-23 (×4): qty 1

## 2013-06-23 MED ORDER — MOMETASONE FURO-FORMOTEROL FUM 100-5 MCG/ACT IN AERO
2.0000 | INHALATION_SPRAY | Freq: Two times a day (BID) | RESPIRATORY_TRACT | Status: DC | PRN
Start: 2013-06-23 — End: 2013-06-26
  Filled 2013-06-23: qty 8.8

## 2013-06-23 MED ORDER — PANTOPRAZOLE SODIUM 40 MG PO TBEC
40.0000 mg | DELAYED_RELEASE_TABLET | Freq: Two times a day (BID) | ORAL | Status: DC
  Administered 2013-06-23 – 2013-06-26 (×6): 40 mg via ORAL
  Filled 2013-06-23 (×6): qty 1

## 2013-06-23 MED ORDER — SODIUM CHLORIDE 0.9 % IV BOLUS (SEPSIS)
1000.0000 mL | Freq: Once | INTRAVENOUS | Status: AC
  Administered 2013-06-23: 1000 mL via INTRAVENOUS

## 2013-06-23 MED ORDER — LOSARTAN POTASSIUM 25 MG PO TABS
25.0000 mg | ORAL_TABLET | Freq: Every day | ORAL | Status: DC
  Administered 2013-06-23 – 2013-06-26 (×4): 25 mg via ORAL
  Filled 2013-06-23 (×4): qty 1

## 2013-06-23 MED ORDER — ESCITALOPRAM OXALATE 10 MG PO TABS
10.0000 mg | ORAL_TABLET | Freq: Every day | ORAL | Status: DC
  Administered 2013-06-23 – 2013-06-26 (×4): 10 mg via ORAL
  Filled 2013-06-23 (×4): qty 1

## 2013-06-23 MED ORDER — ONDANSETRON 4 MG PO TBDP
4.0000 mg | ORAL_TABLET | ORAL | Status: DC | PRN
  Administered 2013-06-25: 4 mg via ORAL
  Filled 2013-06-23 (×2): qty 1

## 2013-06-23 MED ORDER — POLYETHYLENE GLYCOL 3350 17 G PO PACK
17.0000 g | PACK | Freq: Every day | ORAL | Status: DC | PRN
  Filled 2013-06-23: qty 1

## 2013-06-23 MED ORDER — LEVOFLOXACIN 500 MG PO TABS
500.0000 mg | ORAL_TABLET | Freq: Every day | ORAL | Status: DC
  Administered 2013-06-24: 500 mg via ORAL
  Filled 2013-06-23: qty 1

## 2013-06-23 MED ORDER — SODIUM CHLORIDE 0.9 % IV SOLN
INTRAVENOUS | Status: DC
Start: 2013-06-23 — End: 2013-06-25
  Administered 2013-06-23: 1000 mL via INTRAVENOUS
  Administered 2013-06-24 – 2013-06-25 (×3): via INTRAVENOUS

## 2013-06-23 MED ORDER — ZOLPIDEM TARTRATE 5 MG PO TABS
5.0000 mg | ORAL_TABLET | Freq: Every evening | ORAL | Status: DC | PRN
  Administered 2013-06-25 (×2): 5 mg via ORAL
  Filled 2013-06-23 (×2): qty 1

## 2013-06-23 NOTE — ED Notes (Signed)
Pt states she feels so dry, has been x 3 days before she was able to tolerate fluids or food.  Pt states the abdominal pain is in the lower part of her abdomen on palpation.  Pt states she has had a little pain during urination, denies vaginal discharge or bleeding.

## 2013-06-23 NOTE — ED Notes (Signed)
Critical INR 5.6 per Lajuana Matte.

## 2013-06-23 NOTE — Progress Notes (Signed)
ANTICOAGULATION CONSULT NOTE - Initial Consult  Pharmacy Consult for coumadin Indication: atrial fibrillation  Allergies  Allergen Reactions  . Demerol Nausea And Vomiting  . Penicillins Hives  . Tape Other (See Comments)    "surgical tape tears skin up; paper tape is ok"  . Morphine And Related Itching  . Codeine Nausea And Vomiting    Patient Measurements: Weight: 220 lb (99.791 kg) Heparin Dosing Weight:   Vital Signs: Temp: 98 F (36.7 C) (03/29 1423) Temp src: Oral (03/29 1423) BP: 136/61 mmHg (03/29 1423) Pulse Rate: 78 (03/29 1423)  Labs:  Recent Labs  06/23/13 1426  HGB 10.4*  HCT 33.0*  PLT 436*  LABPROT 48.4*  INR 5.60*  CREATININE 1.18*  TROPONINI <0.30    The CrCl is unknown because both a height and weight (above a minimum accepted value) are required for this calculation.   Medical History: Past Medical History  Diagnosis Date  . CHF (congestive heart failure)     chronic systolic CHF  . Hypertension   . Atrial fibrillation   . Obesity   . Coronary artery disease     cardiac catheterization in 1997 -- Est. EF of 50%  . Fatigue   . Stress   . Chest pain   . Peptic ulcer 1985  . Angina   . GERD (gastroesophageal reflux disease)   . Depression   . Sinoatrial node dysfunction   . Gastric polyps   . Colon polyps 05/19/2009    Tubular Adenomous polyps  . Diverticulosis   . Endometrial cancer 1995  . Hypercholesteremia   . DVT (deep venous thrombosis) 1960's    ?RLE (03/16/2012)  . Pneumonia 1991  . Exertional dyspnea     "sometimes" (03/16/2012)  . Type II diabetes mellitus   . Anemia     "iron transfusions ?twice" (03/16/2012)  . Internal hemorrhoid, bleeding     "sometimes" (03/16/2012)  . Migraines     "when I was going thru menopause; none lately" (03/16/2012)  . Arthritis     "qwhere" (03/16/2012)  . Chronic back pain     Medications:   (Not in a hospital admission) Scheduled:  . fentaNYL  50 mcg Transdermal Q72H  .  insulin aspart  0-15 Units Subcutaneous TID WC  . ondansetron (ZOFRAN) IV  4 mg Intravenous 4 times per day   Infusions:  . sodium chloride 1,000 mL (06/23/13 1619)    Assessment: 78 yo who was admitted for possible CAP and dehydration. She has been on coumadin for afib.  Admission INR is 5.6 so we'll hold coumadin until INR is back in range.   Goal of Therapy:  INR 2-3 Monitor platelets by anticoagulation protocol: Yes   Plan:   Hold coumadin until INR in range Daily INR

## 2013-06-23 NOTE — ED Notes (Signed)
Pt in with family c/o generalized weakness, nausea and intermittent body aches over the last two weeks, states she has been to her PCP and they did a chest xray and blood work, states they called her back and told her they saw something on her chest xray and they schedule another scan of her chest for Monday but symptoms have increased today and patient hasn't eaten in 3-4 days

## 2013-06-23 NOTE — H&P (Signed)
Physician Admission History and Physical     PCP:   Geoffery Lyons, MD   Chief Complaint:  Dry heaves, "elevated WBC count", and progressive weakness   HPI: Joyce ALDERFER is an 78 y.o. female.  Pt states she has been sick for weeks. She was seen in office last week and found to have PNA and started on levaquin 500 qd. In addition, she had mild leukocytosis but CBC, Cmet otherwise normal. She has been taking the levaquin not as prescribed, but twice daily instead of once daily. She called on call physician over the wkd w/ nausea and  Dry heaving. She was prescribed zofran and only took 1 pill prior to presenting today b/c she states she is tired of feeling poorly and unable to tolerate PO.   In the ED she clincally appears dehydrated but labs and vitals remain normal. Her WBC count has normalized. She has mild epigastric pain but otherwise normal abd exam. Her INR is 5's given she has been taking 1000mg  of levaquin daily for the past few days. She has not complaints of GI bleed and Hgb is stable.   She will be admitted to obs for rehydration and antiemetics   Review of Systems:  Neg except as noted above   Past Medical History: Past Medical History  Diagnosis Date  . CHF (congestive heart failure)     chronic systolic CHF  . Hypertension   . Atrial fibrillation   . Obesity   . Coronary artery disease     cardiac catheterization in 1997 -- Est. EF of 50%  . Fatigue   . Stress   . Chest pain   . Peptic ulcer 1985  . Angina   . GERD (gastroesophageal reflux disease)   . Depression   . Sinoatrial node dysfunction   . Gastric polyps   . Colon polyps 05/19/2009    Tubular Adenomous polyps  . Diverticulosis   . Endometrial cancer 1995  . Hypercholesteremia   . DVT (deep venous thrombosis) 1960's    ?RLE (03/16/2012)  . Pneumonia 1991  . Exertional dyspnea     "sometimes" (03/16/2012)  . Type II diabetes mellitus   . Anemia     "iron transfusions ?twice" (03/16/2012)   . Internal hemorrhoid, bleeding     "sometimes" (03/16/2012)  . Migraines     "when I was going thru menopause; none lately" (03/16/2012)  . Arthritis     "qwhere" (03/16/2012)  . Chronic back pain    Past Surgical History  Procedure Laterality Date  . Vesicovaginal fistula closure w/ tah  1995  . Joint replacement      LEFT HIP   02/2008  . Cholecystectomy  fall 2001  . Cardiac catheterization  02/21/96; 03/16/2012    Est. EF of 50%; "just to look" (03/16/2012)  . Coronary angioplasty with stent placement  02/15/11    "one"  . Coronary angioplasty  03/16/2012    "couldn't put stent in cause of where it is" (03/16/2012)  . Tonsillectomy  ~ 1962  . Abdominal hysterectomy  1995    "endometrial cancer" (03/16/2012)  . Dilation and curettage of uterus  1950's    "had probably 5; several miscarriages when I was younger"  . Total hip arthroplasty  02/2008    "left" (03/16/2012)  . Insert / replace / remove pacemaker  07/23/99    atrial insertion of the AV node -- Medtronic single chamber pacemaker   . Insert / replace / remove pacemaker  2012    "  replaced" (03/16/2012)  . Ptca  02/2012    Balloon angioplast of jailed diagonal branch by prior LAD stent    Medications: Prior to Admission medications   Medication Sig Start Date End Date Taking? Authorizing Provider  carvedilol (COREG) 3.125 MG tablet Take 3.125 mg by mouth 2 (two) times daily with a meal.   Yes Historical Provider, MD  escitalopram (LEXAPRO) 10 MG tablet Take 10 mg by mouth daily.     Yes Historical Provider, MD  ezetimibe-simvastatin (VYTORIN) 10-20 MG per tablet Take 1 tablet by mouth at bedtime.    Yes Historical Provider, MD  fentaNYL (DURAGESIC - DOSED MCG/HR) 50 MCG/HR Place 1 patch onto the skin every 3 (three) days.   Yes Historical Provider, MD  Fluticasone-Salmeterol (ADVAIR) 250-50 MCG/DOSE AEPB Inhale 1 puff into the lungs every 12 (twelve) hours as needed. For shortness of breath    Yes Historical  Provider, MD  glyBURIDE-metformin (GLUCOVANCE) 5-500 MG per tablet Take 2 tablets by mouth 2 (two) times daily with a meal. 03/18/12  Yes Roger A Arguello, PA-C  insulin aspart (NOVOLOG) 100 UNIT/ML injection Inject 4-8 Units into the skin 2 (two) times daily. If blood sugar>250, use 7-8 units. If if <200, use 4-5 units    Yes Historical Provider, MD  insulin glargine (LANTUS) 100 UNIT/ML injection Inject 30 Units into the skin at bedtime.    Yes Historical Provider, MD  levofloxacin (LEVAQUIN) 500 MG tablet Take 500 mg by mouth daily.   Yes Historical Provider, MD  losartan (COZAAR) 25 MG tablet Take 1 tablet (25 mg total) by mouth daily. 04/23/13 04/23/14 Yes Evans Lance, MD  nitroGLYCERIN (NITROSTAT) 0.4 MG SL tablet Place 1 tablet (0.4 mg total) under the tongue every 5 (five) minutes as needed for chest pain. 06/25/12 06/25/13 Yes Thayer Headings, MD  ondansetron (ZOFRAN-ODT) 4 MG disintegrating tablet Take 4 mg by mouth every 4 (four) hours as needed for nausea.  06/23/13  Yes Historical Provider, MD  OVER THE COUNTER MEDICATION Place 1-2 drops into both eyes daily as needed. For dry/itchy eyes --saline drops    Yes Historical Provider, MD  oxyCODONE-acetaminophen (PERCOCET) 10-325 MG per tablet Take 1 tablet by mouth every 4 (four) hours as needed. For pain    Yes Historical Provider, MD  pantoprazole (PROTONIX) 40 MG tablet 40 mg 2 (two) times daily.  03/25/13  Yes Historical Provider, MD  torsemide (DEMADEX) 20 MG tablet Take 1 tablet (20 mg total) by mouth daily. 03/26/13  Yes Thayer Headings, MD  warfarin (COUMADIN) 5 MG tablet Take 2.5-5 mg by mouth daily. Take 2.5 mg Mon,Wed and Fri and all other days take 5 mg   Yes Historical Provider, MD  zolpidem (AMBIEN) 10 MG tablet Take 5 mg by mouth at bedtime as needed. For sleep   Yes Historical Provider, MD    Allergies:   Allergies  Allergen Reactions  . Demerol Nausea And Vomiting  . Penicillins Hives  . Tape Other (See Comments)     "surgical tape tears skin up; paper tape is ok"  . Morphine And Related Itching  . Codeine Nausea And Vomiting    Social History:  reports that she has quit smoking. Her smoking use included Cigarettes. She has a 35 pack-year smoking history. She has never used smokeless tobacco. She reports that she drinks alcohol. She reports that she does not use illicit drugs.  Family History: Family History  Problem Relation Age of Onset  . Heart disease  Mother   . Hypertension Mother     Physical Exam: Filed Vitals:   06/23/13 1351 06/23/13 1423  BP: 117/55 136/61  Pulse: 71 78  Temp: 98.2 F (36.8 C) 98 F (36.7 C)  TempSrc: Oral Oral  Resp: 20 24  Weight: 99.791 kg (220 lb)   SpO2: 99% 97%   Gen: WF in mild distress . Paced rhythm on bedside tele  HEENT: dry MM, anicteric  Lungs: CTAB, no wheezes, rales  Cardio:  RRR , no MRG  Abd:  Soft, tender in epigastrium  MSK:  No focal deficits  Neuro: no focal deficits  Skin: dry, warm     Labs on Admission:   Recent Labs  06/23/13 1426  NA 141  K 4.0  CL 98  CO2 27  GLUCOSE 206*  BUN 14  CREATININE 1.18*  CALCIUM 7.5*    Recent Labs  06/23/13 1426  AST 19  ALT 12  ALKPHOS 70  BILITOT 0.3  PROT 6.7  ALBUMIN 2.7*    Recent Labs  06/23/13 1426  LIPASE 12    Recent Labs  06/23/13 1426  WBC 10.3  NEUTROABS 9.0*  HGB 10.4*  HCT 33.0*  MCV 72.4*  PLT 436*    Recent Labs  06/23/13 1426  TROPONINI <0.30   Lab Results  Component Value Date   INR 5.60* 06/23/2013   INR 1.13 03/16/2012   INR 2.7* 03/12/2012   No results found for this basename: TSH, T4TOTAL, FREET3, T3FREE, THYROIDAB,  in the last 72 hours No results found for this basename: VITAMINB12, FOLATE, FERRITIN, TIBC, IRON, RETICCTPCT,  in the last 72 hours  Radiological Exams on Admission: Ct Abdomen Pelvis W Contrast  06/17/2013   CLINICAL DATA:  78 year old female abdominal pelvic pain, nausea, diarrhea and elevated white count.  EXAM:  CT ABDOMEN AND PELVIS WITH CONTRAST  TECHNIQUE: Multidetector CT imaging of the abdomen and pelvis was performed using the standard protocol following bolus administration of intravenous contrast.  CONTRAST:  56mL OMNIPAQUE IOHEXOL 300 MG/ML  SOLN  COMPARISON:  04/17/2012 CT  FINDINGS: Cardiomegaly and pacemaker lead again noted.  The liver, spleen, adrenal glands and pancreas are unremarkable.  Moderate bilateral renal cortical atrophy identified. The kidneys are otherwise unremarkable.  A trace amount of free pelvic fluid is nonspecific.  Descending and sigmoid colonic diverticulosis noted without evidence of diverticulitis.  There is no evidence bowel obstruction, pneumoperitoneum or abscess.  No enlarged lymph nodes, biliary dilatation or abdominal aortic aneurysm identified.  No acute or suspicious bony abnormalities are present.  Left hip replacement identified.  IMPRESSION: Trace amount of free pelvic fluid which is nonspecific.  No other acute abnormalities identified.  Colonic diverticulosis without diverticulitis.  Moderate bilateral renal cortical thinning.   Electronically Signed   By: Hassan Rowan M.D.   On: 06/17/2013 16:12   Orders placed during the hospital encounter of 06/23/13  . EKG 12-LEAD  . EKG 12-LEAD  . EKG 12-LEAD  . EKG 12-LEAD    Assessment/Plan Active Problems:   Dehydration  - no tachycardia, no AKI, but does appear clinically dehydrated and complains of weakness   - admit to obs. IVF overnight + zofran   - hold her diuretic at this time    CAP   - has 3 days remaining of levaquin 500mg  daily   - was due for repeat CT chest tomorrow, so will order in house to ensure continued resolution of her PNA   - WBC count has normalized  since prior to levaquin   A fib   - on coumadin, supratherapeutic at this time   - will allow INR to normalize bf resuming coumadin, pharm consulted   - tele . Home meds   Dispo - home when able to tolerate solids . PT/OT ordered given her  progressive weakness. SW consulted in case placement needed   Daryle Boyington, [place] 06/23/2013, 4:14 PM

## 2013-06-23 NOTE — ED Provider Notes (Signed)
CSN: [id number]     Arrival date & time 06/23/13  1344 History   First MD Initiated Contact with Patient 06/23/13 1404     Chief Complaint  Patient presents with  . Fatigue  . Nausea     (Consider location/radiation/quality/duration/timing/severity/associated sxs/prior Treatment) HPI Complains of generalized weakness and vomiting and dry heaves for the past several days. She developed diffuse abdominal pain this morning. She admits to slight cough. She was prescribed an antiemetic by her PCP this morning. Took one dose, without relief. Continues to have dry heaves. Denies fever. Denies urinary symptoms has also been treated with Levaquin due to "elevated white count. Patient also admits to bleeding from hemorrhoid for the past several days. She has an outpatient CT scan of her chest scheduled for reported abnormal chest x-ray but her niece felt that she could not wait, due to generalized weakness. Patient feels dehydrated. She's had very little oral intake over the past several days Past Medical History  Diagnosis Date  . CHF (congestive heart failure)     chronic systolic CHF  . Hypertension   . Atrial fibrillation   . Obesity   . Coronary artery disease     cardiac catheterization in 1997 -- Est. EF of 50%  . Fatigue   . Stress   . Chest pain   . Peptic ulcer 1985  . Angina   . GERD (gastroesophageal reflux disease)   . Depression   . Sinoatrial node dysfunction   . Gastric polyps   . Colon polyps 05/19/2009    Tubular Adenomous polyps  . Diverticulosis   . Endometrial cancer 1995  . Hypercholesteremia   . DVT (deep venous thrombosis) 1960's    ?RLE (03/16/2012)  . Pneumonia 1991  . Exertional dyspnea     "sometimes" (03/16/2012)  . Type II diabetes mellitus   . Anemia     "iron transfusions ?twice" (03/16/2012)  . Internal hemorrhoid, bleeding     "sometimes" (03/16/2012)  . Migraines     "when I was going thru menopause; none lately" (03/16/2012)  . Arthritis    "qwhere" (03/16/2012)  . Chronic back pain    Past Surgical History  Procedure Laterality Date  . Vesicovaginal fistula closure w/ tah  1995  . Joint replacement      LEFT HIP   02/2008  . Cholecystectomy  fall 2001  . Cardiac catheterization  02/21/96; 03/16/2012    Est. EF of 50%; "just to look" (03/16/2012)  . Coronary angioplasty with stent placement  02/15/11    "one"  . Coronary angioplasty  03/16/2012    "couldn't put stent in cause of where it is" (03/16/2012)  . Tonsillectomy  ~ 1962  . Abdominal hysterectomy  1995    "endometrial cancer" (03/16/2012)  . Dilation and curettage of uterus  1950's    "had probably 5; several miscarriages when I was younger"  . Total hip arthroplasty  02/2008    "left" (03/16/2012)  . Insert / replace / remove pacemaker  07/23/99    atrial insertion of the AV node -- [employer]onic single chamber pacemaker   . Insert / replace / remove pacemaker  2012    "replaced" (03/16/2012)  . Ptca  02/2012    Balloon angioplast of jailed diagonal branch by prior LAD stent   Family History  Problem Relation Age of Onset  . Heart disease Mother   . Hypertension Mother    History  Substance Use Topics  . Smoking status: Former Smoker --  1.00 packs/day for 35 years    Types: Cigarettes  . Smokeless tobacco: Never Used     Comment: quit smoking "11/1989"  . Alcohol Use: Yes     Comment: "glass of wine q once in awhile"   no drug use OB History   Grav Para Term Preterm Abortions TAB SAB Ect Mult Living                 Review of Systems  Constitutional: Positive for appetite change.  HENT: Negative.   Respiratory: Negative.   Cardiovascular: Negative.   Gastrointestinal: Positive for nausea, vomiting and abdominal pain.  Musculoskeletal: Positive for back pain.       Chronic back pain  Skin: Negative.   Neurological: Negative.   Psychiatric/Behavioral: Negative.   All other systems reviewed and are negative.      Allergies  Demerol;  Penicillins; Tape; Morphine and related; and Codeine  Home Medications   Current Outpatient Rx  Name  Route  Sig  Dispense  Refill  . carvedilol (COREG) 3.125 MG tablet      take 1 tablet twice a day   60 tablet   6   . escitalopram (LEXAPRO) 10 MG tablet   Oral   Take 10 mg by mouth daily.           Marland Kitchen esomeprazole (NEXIUM) 40 MG capsule   Oral   Take 40 mg by mouth daily before breakfast.           . ezetimibe-simvastatin (VYTORIN) 10-20 MG per tablet   Oral   Take 1 tablet by mouth at bedtime.          . fentaNYL (DURAGESIC - DOSED MCG/HR) 50 MCG/HR   Transdermal   Place 1 patch onto the skin every 3 (three) days.         . Fluticasone-Salmeterol (ADVAIR) 250-50 MCG/DOSE AEPB   Inhalation   Inhale 1 puff into the lungs every 12 (twelve) hours as needed. For shortness of breath          . glyBURIDE-metformin (GLUCOVANCE) 5-500 MG per tablet   Oral   Take 2 tablets by mouth 2 (two) times daily with a meal.         . insulin aspart (NOVOLOG) 100 UNIT/ML injection   Subcutaneous   Inject 4-8 Units into the skin 2 (two) times daily. If blood sugar>250, use 7-8 units. If if <200, use 4-5 units          . insulin glargine (LANTUS) 100 UNIT/ML injection   Subcutaneous   Inject 30 Units into the skin at bedtime.          Marland Kitchen losartan (COZAAR) 25 MG tablet   Oral   Take 1 tablet (25 mg total) by mouth daily.   30 tablet   6   . nitroGLYCERIN (NITROSTAT) 0.4 MG SL tablet   Sublingual   Place 1 tablet (0.4 mg total) under the tongue every 5 (five) minutes as needed for chest pain.   25 tablet   3   . OVER THE COUNTER MEDICATION   Both Eyes   Place 1-2 drops into both eyes daily as needed. For dry/itchy eyes --saline drops          . oxyCODONE-acetaminophen (PERCOCET) 10-325 MG per tablet   Oral   Take 1 tablet by mouth every 4 (four) hours as needed. For pain          . pantoprazole (PROTONIX) 40 MG tablet               .  torsemide  (DEMADEX) 20 MG tablet   Oral   Take 1 tablet (20 mg total) by mouth daily.   30 tablet   5   . warfarin (COUMADIN) 5 MG tablet   Oral   Take 2.5-5 mg by mouth daily. Take 2.5 mg tues,and fri, take 5 mg all other days          . zolpidem (AMBIEN) 10 MG tablet   Oral   Take 5 mg by mouth at bedtime as needed. For sleep          BP 117/55  Pulse 71  Temp(Src) 98.2 F (36.8 C) (Oral)  Resp 20  Wt 220 lb (99.791 kg)  SpO2 99% Physical Exam  Nursing note and vitals reviewed. Constitutional: She appears well-developed and well-nourished.  HENT:  Head: Normocephalic and atraumatic.  Mucous membranes dry  Eyes: Conjunctivae are normal. Pupils are equal, round, and reactive to light.  Neck: Neck supple. No tracheal deviation present. No thyromegaly present.  Cardiovascular: Normal rate and regular rhythm.   No murmur heard. Pulmonary/Chest: Effort normal and breath sounds normal.  Abdominal: Soft. Bowel sounds are normal. She exhibits no distension. There is no tenderness.  Genitourinary:  Rectum external hemorrhoid present using slight gross blood  Musculoskeletal: Normal range of motion. She exhibits no edema and no tenderness.  Neurological: She is alert. Coordination normal.  Skin: Skin is warm and dry. No rash noted.  Psychiatric: She has a normal mood and affect.    ED Course  Procedures (including critical care time) Labs Review Labs Reviewed  CBC WITH DIFFERENTIAL  COMPREHENSIVE METABOLIC PANEL  URINALYSIS, ROUTINE W REFLEX MICROSCOPIC   Imaging Review No results found.   EKG Interpretation   Date/Time:  Sunday June 23 2013 14:29:52 EDT Ventricular Rate:  71 PR Interval:    QRS Duration: 153 QT Interval:  451 QTC Calculation: 490 R Axis:   -75 Text Interpretation:  VENTRICULAR PACED RHYTHM Nonspecific IVCD with LAD  Left ventricular hypertrophy Anterolateral infarct, age indeterminate No  significant change since last tracing Confirmed by  Winfred Leeds  MD, Daphane Odekirk  [phone]) on 06/23/2013 3:59:10 PM      Results for orders placed during the hospital encounter of 06/23/13  CBC WITH DIFFERENTIAL      Result Value Ref Range   WBC 10.3  4.0 - 10.5 K/uL   RBC 4.56  3.87 - 5.11 MIL/uL   Hemoglobin 10.4 (*) 12.0 - 15.0 g/dL   HCT 33.0 (*) 36.0 - 46.0 %   MCV 72.4 (*) 78.0 - 100.0 fL   MCH 22.8 (*) 26.0 - 34.0 pg   MCHC 31.5  30.0 - 36.0 g/dL   RDW 16.6 (*) 11.5 - 15.5 %   Platelets 436 (*) 150 - 400 K/uL   Neutrophils Relative % 87 (*) 43 - 77 %   Neutro Abs 9.0 (*) 1.7 - 7.7 K/uL   Lymphocytes Relative 7 (*) 12 - 46 %   Lymphs Abs 0.7  0.7 - 4.0 K/uL   Monocytes Relative 6  3 - 12 %   Monocytes Absolute 0.6  0.1 - 1.0 K/uL   Eosinophils Relative 0  0 - 5 %   Eosinophils Absolute 0.0  0.0 - 0.7 K/uL   Basophils Relative 0  0 - 1 %   Basophils Absolute 0.0  0.0 - 0.1 K/uL  COMPREHENSIVE METABOLIC PANEL      Result Value Ref Range   Sodium 141  137 - 147 mEq/L  Potassium 4.0  3.7 - 5.3 mEq/L   Chloride 98  96 - 112 mEq/L   CO2 27  19 - 32 mEq/L   Glucose, Bld 206 (*) 70 - 99 mg/dL   BUN 14  6 - 23 mg/dL   Creatinine, Ser 1.18 (*) 0.50 - 1.10 mg/dL   Calcium 7.5 (*) 8.4 - 10.5 mg/dL   Total Protein 6.7  6.0 - 8.3 g/dL   Albumin 2.7 (*) 3.5 - 5.2 g/dL   AST 19  0 - 37 U/L   ALT 12  0 - 35 U/L   Alkaline Phosphatase 70  39 - 117 U/L   Total Bilirubin 0.3  0.3 - 1.2 mg/dL   GFR calc non Af Amer 44 (*) >90 mL/min   GFR calc Af Amer 51 (*) >90 mL/min  URINALYSIS, ROUTINE W REFLEX MICROSCOPIC      Result Value Ref Range   Color, Urine YELLOW  YELLOW   APPearance CLOUDY (*) CLEAR   Specific Gravity, Urine 1.018  1.005 - 1.030   pH 5.0  5.0 - 8.0   Glucose, UA NEGATIVE  NEGATIVE mg/dL   Hgb urine dipstick LARGE (*) NEGATIVE   Bilirubin Urine NEGATIVE  NEGATIVE   Ketones, ur 15 (*) NEGATIVE mg/dL   Protein, ur NEGATIVE  NEGATIVE mg/dL   Urobilinogen, UA 0.2  0.0 - 1.0 mg/dL   Nitrite NEGATIVE  NEGATIVE   Leukocytes, UA  NEGATIVE  NEGATIVE  PROTIME-INR      Result Value Ref Range   Prothrombin Time 48.4 (*) 11.6 - 15.2 seconds   INR 5.60 (*) 0.00 - 1.49  TROPONIN I      Result Value Ref Range   Troponin I <0.30  <0.30 ng/mL  LIPASE, BLOOD      Result Value Ref Range   Lipase 12  11 - 59 U/L  URINE MICROSCOPIC-ADD ON      Result Value Ref Range   Squamous Epithelial / LPF RARE  RARE   WBC, UA 0-2  <3 WBC/hpf   RBC / HPF 11-20  <3 RBC/hpf   Ct Abdomen Pelvis W Contrast  06/17/2013   CLINICAL DATA:  78 year old female abdominal pelvic pain, nausea, diarrhea and elevated white count.  EXAM: CT ABDOMEN AND PELVIS WITH CONTRAST  TECHNIQUE: Multidetector CT imaging of the abdomen and pelvis was performed using the standard protocol following bolus administration of intravenous contrast.  CONTRAST:  23mL OMNIPAQUE IOHEXOL 300 MG/ML  SOLN  COMPARISON:  04/17/2012 CT  FINDINGS: Cardiomegaly and pacemaker lead again noted.  The liver, spleen, adrenal glands and pancreas are unremarkable.  Moderate bilateral renal cortical atrophy identified. The kidneys are otherwise unremarkable.  A trace amount of free pelvic fluid is nonspecific.  Descending and sigmoid colonic diverticulosis noted without evidence of diverticulitis.  There is no evidence bowel obstruction, pneumoperitoneum or abscess.  No enlarged lymph nodes, biliary dilatation or abdominal aortic aneurysm identified.  No acute or suspicious bony abnormalities are present.  Left hip replacement identified.  IMPRESSION: Trace amount of free pelvic fluid which is nonspecific.  No other acute abnormalities identified.  Colonic diverticulosis without diverticulitis.  Moderate bilateral renal cortical thinning.   Electronically Signed   By: Hassan Rowan M.D.   On: 06/17/2013 16:12    3:50 PM patient states nausea improved after treatment with Zofran transiently however she continues to complain of nausea and back pain but she suffers from chronically. Morphine and Reglan  ordered. Additional intravenous fluids were ordered MDM  Final diagnoses:  None   patient is clinically dehydrated. I spoke with Dr. Nicki Reaper will come to ED to evaluate patient. Diagnosis #1 dehydration #2 Coumadin toxicity #3 rectal bleeding     Orlie Dakin, MD 06/23/13 [phone]

## 2013-06-24 ENCOUNTER — Other Ambulatory Visit: Payer: Medicare Other

## 2013-06-24 LAB — BASIC METABOLIC PANEL
BUN: 12 mg/dL (ref 6–23)
CALCIUM: 6.4 mg/dL — AB (ref 8.4–10.5)
CO2: 26 mEq/L (ref 19–32)
Chloride: 107 mEq/L (ref 96–112)
Creatinine, Ser: 1.12 mg/dL — ABNORMAL HIGH (ref 0.50–1.10)
GFR calc Af Amer: 54 mL/min — ABNORMAL LOW (ref >90)
GFR calc non Af Amer: 47 mL/min — ABNORMAL LOW (ref >90)
GLUCOSE: 122 mg/dL — AB (ref 70–99)
Potassium: 3.5 mEq/L — ABNORMAL LOW (ref 3.7–5.3)
Sodium: 146 mEq/L (ref 137–147)

## 2013-06-24 LAB — GLUCOSE, CAPILLARY
Glucose-Capillary: 117 mg/dL — ABNORMAL HIGH (ref 70–99)
Glucose-Capillary: 205 mg/dL — ABNORMAL HIGH (ref 70–99)
Glucose-Capillary: 99 mg/dL (ref 70–99)
Glucose-Capillary: 116 mg/dL — ABNORMAL HIGH (ref 70–99)

## 2013-06-24 LAB — CBC
HCT: 28.5 % — ABNORMAL LOW (ref 36.0–46.0)
HEMOGLOBIN: 8.8 g/dL — AB (ref 12.0–15.0)
MCH: 22.6 pg — AB (ref 26.0–34.0)
MCHC: 30.9 g/dL (ref 30.0–36.0)
MCV: 73.3 fL — ABNORMAL LOW (ref 78.0–100.0)
PLATELETS: 301 10*3/uL (ref 150–400)
RBC: 3.89 MIL/uL (ref 3.87–5.11)
RDW: 17 % — AB (ref 11.5–15.5)
WBC: 9.3 10*3/uL (ref 4.0–10.5)

## 2013-06-24 LAB — HEMOGLOBIN A1C
Hgb A1c MFr Bld: 7.2 % — ABNORMAL HIGH (ref <5.7)
MEAN PLASMA GLUCOSE: 160 mg/dL — AB (ref <117)

## 2013-06-24 LAB — PROTIME-INR
INR: 8.53 (ref 0.00–1.49)
PROTHROMBIN TIME: 68.4 s — AB (ref 11.6–15.2)

## 2013-06-24 MED ORDER — CALCIUM CARBONATE-VITAMIN D 500-200 MG-UNIT PO TABS
1.0000 | ORAL_TABLET | Freq: Every day | ORAL | Status: DC
  Filled 2013-06-24 (×2): qty 1

## 2013-06-24 MED ORDER — PHYTONADIONE 5 MG PO TABS
2.5000 mg | ORAL_TABLET | Freq: Once | ORAL | Status: AC
  Administered 2013-06-24: 2.5 mg via ORAL
  Filled 2013-06-24: qty 1

## 2013-06-24 MED ORDER — PHYTONADIONE 5 MG PO TABS
5.0000 mg | ORAL_TABLET | Freq: Once | ORAL | Status: DC
  Filled 2013-06-24: qty 1

## 2013-06-24 MED ORDER — POTASSIUM CHLORIDE 20 MEQ PO PACK
40.0000 meq | PACK | Freq: Two times a day (BID) | ORAL | Status: DC
Start: 2013-06-24 — End: 2013-06-24

## 2013-06-24 MED ORDER — LEVOFLOXACIN 500 MG PO TABS
500.0000 mg | ORAL_TABLET | Freq: Every day | ORAL | Status: AC
  Administered 2013-06-25: 500 mg via ORAL
  Filled 2013-06-24: qty 1

## 2013-06-24 MED ORDER — POTASSIUM CHLORIDE CRYS ER 20 MEQ PO TBCR
40.0000 meq | EXTENDED_RELEASE_TABLET | Freq: Two times a day (BID) | ORAL | Status: DC
  Administered 2013-06-24 – 2013-06-25 (×4): 40 meq via ORAL
  Filled 2013-06-24 (×7): qty 2

## 2013-06-24 MED ORDER — VITAMIN K1 1 MG/0.5ML IJ SOLN
1.0000 mg | Freq: Once | INTRAMUSCULAR | Status: AC
  Administered 2013-06-24: 1 mg via SUBCUTANEOUS
  Filled 2013-06-24: qty 0.5

## 2013-06-24 MED ORDER — POTASSIUM CHLORIDE CRYS ER 10 MEQ PO TBCR
10.0000 meq | EXTENDED_RELEASE_TABLET | Freq: Once | ORAL | Status: AC
Start: 2013-06-24 — End: 2013-06-24
  Administered 2013-06-24: 10 meq via ORAL
  Filled 2013-06-24 (×2): qty 1

## 2013-06-24 MED ORDER — FUROSEMIDE 20 MG PO TABS
20.0000 mg | ORAL_TABLET | Freq: Once | ORAL | Status: AC
  Administered 2013-06-24: 20 mg via ORAL
  Filled 2013-06-24 (×2): qty 1

## 2013-06-24 NOTE — Progress Notes (Signed)
ANTICOAGULATION CONSULT NOTE - Follow Up Consult  Pharmacy Consult:  Coumadin Indication: atrial fibrillation  Allergies  Allergen Reactions  . Demerol Nausea And Vomiting  . Penicillins Hives  . Tape Other (See Comments)    "surgical tape tears skin up; paper tape is ok"  . Morphine And Related Itching  . Codeine Nausea And Vomiting    Patient Measurements: Height: 5' 8.5" (174 cm) Weight: 223 lb 8 oz (101.379 kg) (rn notified of weight gain) IBW/kg (Calculated) : 65.05  Vital Signs: Temp: 98 F (36.7 C) (03/30 0632) Temp src: Oral (03/30 0632) BP: 148/62 mmHg (03/30 0632) Pulse Rate: 70 (03/30 0632)  Labs:  Recent Labs  06/23/13 1426 06/24/13 0417  HGB 10.4* 8.8*  HCT 33.0* 28.5*  PLT 436* 301  LABPROT 48.4* 68.4*  INR 5.60* 8.53*  CREATININE 1.18* 1.12*  TROPONINI <0.30  --     Estimated Creatinine Clearance: 54.5 ml/min (by C-G formula based on Cr of 1.12).     Assessment: 78 YOF admitted for possible CAP and dehydration continued on Levaquin from PTA.Marland Kitchen  She is also on Coumadin from PTA for history of Afib.  INR was supra-therapeutic on admit likely due to DDI with Levaquin.  INR has increased to 8.53 today despite Coumadin being on hold.  No bleeding documented.   Goal of Therapy:  INR 2-3 Monitor platelets by anticoagulation protocol: Yes    Plan:  - Continue to hold Coumadin - Daily PT / INR - F/U KCL / calcium supplementation and the need to reverse INR - F/U stop date for abx (recommend d/c on 3/31 post 7 days of therapy)    Tierre Netto D. Mina Marble, PharmD, BCPS Pager:  [phone] 06/24/2013, 8:43 AM

## 2013-06-24 NOTE — Progress Notes (Signed)
Physician Daily Progress Note  Subjective: Ate oatmeal this AM  Still mild nausea  No bleeding  Walking around room w/o issue   Objective: Vital signs in last 24 hours: Temp:  [97.7 F (36.5 C)-98.5 F (36.9 C)] 98 F (36.7 C) (03/30 4010) Pulse Rate:  [70-78] 70 (03/30 0632) Resp:  [17-24] 18 (03/30 2725) BP: (117-148)/(52-62) 148/62 mmHg (03/30 0632) SpO2:  [94 %-99 %] 97 % (03/30 3664) Weight:  [99.791 kg (220 lb)-101.379 kg (223 lb 8 oz)] 101.379 kg (223 lb 8 oz) (03/30 4034) Weight change:  Last BM Date: 06/24/13  CBG (last 3)   Recent Labs  06/23/13 1821 06/23/13 2227 06/24/13 0628  GLUCAP 107* 133* 117*    Intake/Output from previous day:  Intake/Output Summary (Last 24 hours) at 06/24/13 0957 Last data filed at 06/24/13 0839  Gross per 24 hour  Intake    240 ml  Output      0 ml  Net    240 ml      Physical Exam General appearance: WF in mild distress  Eyes: no scleral icterus Throat: oropharynx moist without erythema Resp: CTAB, no wheezes, rales  Cardio: RRR, no MRG  GI: soft, non-tender; bowel sounds normal; no masses,  no organomegaly Extremities: no clubbing, cyanosis or edema   Lab Results:  Recent Labs  06/23/13 1426 06/24/13 0417  NA 141 146  K 4.0 3.5*  CL 98 107  CO2 27 26  GLUCOSE 206* 122*  BUN 14 12  CREATININE 1.18* 1.12*  CALCIUM 7.5* 6.4*     Recent Labs  06/23/13 1426  AST 19  ALT 12  ALKPHOS 70  BILITOT 0.3  PROT 6.7  ALBUMIN 2.7*     Recent Labs  06/23/13 1426 06/24/13 0417  WBC 10.3 9.3  NEUTROABS 9.0*  --   HGB 10.4* 8.8*  HCT 33.0* 28.5*  MCV 72.4* 73.3*  PLT 436* 301    Lab Results  Component Value Date   INR 8.53* 06/24/2013   INR 5.60* 06/23/2013   INR 1.13 03/16/2012     Recent Labs  06/23/13 1426  TROPONINI <0.30    No results found for this basename: TSH, T4TOTAL, FREET3, T3FREE, THYROIDAB,  in the last 72 hours  No results found for this basename: VITAMINB12, FOLATE,  FERRITIN, TIBC, IRON, RETICCTPCT,  in the last 72 hours  Micro Results: No results found for this or any previous visit (from the past 240 hour(s)).  Studies/Results: Ct Chest Wo Contrast  06/23/2013   CLINICAL DATA:  Recent history of pneumonia, generalized weakness, on the lays nausea and intermittent body aches for 2 weeks  EXAM: CT CHEST WITHOUT CONTRAST  TECHNIQUE: Multidetector CT imaging of the chest was performed following the standard protocol without IV contrast.  COMPARISON:  CT abdomen/ pelvis 06/17/2013; prior chest x-ray 03/02/2011  FINDINGS: Mediastinum: Unremarkable CT appearance of the thyroid gland. No suspicious mediastinal or hilar adenopathy. No soft tissue mediastinal mass. The thoracic esophagus is unremarkable.  Heart/Vascular: Limited evaluation in the absence of intravenous contrast. There is a left subclavian approach cardiac rhythm maintenance device with the single lead terminating in the right ventricular apex. The heart is enlarged. There is both left ventricular dilatation and right ventricular and atrial dilatation. Atherosclerotic calcifications noted within the left anterior descending, circumflex and right coronary arteries. The intracardiac blood pool is hypodense relative to the adjacent myocardium consistent with anemia. Calcification of the mitral valve annulus and aortic valve. No pericardial effusion. Normal pulmonary  arterial size. Unremarkable 3 vessel aorta without aneurysmal dilatation.  Lungs/Pleura: Peribronchovascular distribution of ground-glass attenuation airspace opacities and more consolidative changes in the posterior aspect of the right upper lobe. Additionally, there are a few scattered foci of ground-glass attenuation opacity in anterior aspect and lateral aspect of the right upper lobe as well as within the left upper and lower lobes. Small foci of adjacent more solid nodular opacities noted in the left lower lobe measuring 4.4 and 3.7 mm on image 41  of series 3. No pleural effusion. No significant emphysematous change.  Bones/Soft Tissues: No acute fracture or aggressive appearing lytic or blastic osseous lesion. Multilevel degenerative change throughout the thoracic spine.  Upper Abdomen: Surgical changes of prior cholecystectomy. Otherwise, unremarkable visualized upper abdomen.  IMPRESSION: 1. Overall findings are most consistent with a multifocal infectious/inflammatory process such as multifocal bronchopneumonia, or atypical/viral pneumonia. The majority of the findings are in the posterior aspect of the right upper lobe with additional scattered areas of involvement elsewhere in the right upper lobe, left upper lobe and left lower lobe. Recommend follow-up chest CT in 4- 6 weeks following appropriate course of therapy to exclude the less likely possibility of multifocal low-grade adenocarcinoma. 2. Small, nonspecific pulmonary nodules in the left lower lobe measure up to 4 mm. Was these are likely the sequelae of old granulomatous disease, or part of the multifocal infectious/inflammatory process described above, they can also be further evaluated on follow-up CT. 3. Atherosclerosis including multivessel coronary artery disease. 4. Cardiomegaly including both left ventricular dilatation and right heart enlargement. 5. The intracardiac blood pool is hypodense relative to the adjacent myocardium consistent with anemia. 6. Aortic valve and mitral valve annulus calcification.   Electronically Signed   By: Jacqulynn Cadet M.D.   On: 06/23/2013 17:16     Medications: Scheduled: . [START ON 06/25/2013] calcium-vitamin D  1 tablet Oral Q breakfast  . carvedilol  3.125 mg Oral BID WC  . escitalopram  10 mg Oral Daily  . ezetimibe-simvastatin  1 tablet Oral QHS  . fentaNYL  50 mcg Transdermal Q72H  . glyBURIDE  10 mg Oral BID WC   And  . metFORMIN  1,000 mg Oral BID WC  . insulin aspart  0-15 Units Subcutaneous TID WC  . insulin aspart  0-5 Units  Subcutaneous QHS  . levofloxacin  500 mg Oral Daily  . losartan  25 mg Oral Daily  . ondansetron (ZOFRAN) IV  4 mg Intravenous 4 times per day  . pantoprazole  40 mg Oral BID  . phytonadione  2.5 mg Oral Once  . potassium chloride  40 mEq Oral BID  . sodium chloride  3 mL Intravenous Q12H  . Warfarin - Pharmacist Dosing Inpatient   Does not apply q1800   Continuous: . sodium chloride 125 mL/hr at 06/24/13 0943    Assessment/Plan:  CAP - completes 7 days of levaquin tomorrow. Stop date has been placed. Will check flu/legionella/strep to ensure no complicating factors . No fever or leukocytosis   Nausea/emesis - NS at 125cc/hr . zofran qid . Improving   Hypocalcemia - starting on daily supplements   Hypokalemia - repleting  A fib w/ supratherapeutic INR - no signs of bleed. Holding coumadin. Vit k given today. Daily INRs . On tele   DM - Placing on SSI given poor intake   DVT Prophylaxis - INR > 3   Dispo - home when stronger and tolerating PO    LOS: 1 day  Joyce Terry 06/24/2013, 9:57 AM

## 2013-06-24 NOTE — Progress Notes (Signed)
OT Cancellation Note  Patient Details Name: MERSADIE KAVANAUGH MRN: [id number] DOB: [birth year]   Cancelled Treatment:    Reason Eval/Treat Not Completed: Medical issues which prohibited therapy (INR 8.53)   Emmit Alexanders [place], [place] 06/24/2013, 12:13 PM

## 2013-06-24 NOTE — Progress Notes (Signed)
PT Cancellation Note  Patient Details Name: Joyce Terry MRN: [id number] DOB: 1935/12/16   Cancelled Treatment:    Reason Eval/Treat Not Completed: Medical issues which prohibited therapy (INR 8.53 and not appropriate for mobility at this time)   Melford Aase 06/24/2013, 9:19 AM Elwyn Reach, [place]

## 2013-06-24 NOTE — Progress Notes (Signed)
Patient evaluated for community based chronic disease management services with [facility] as a benefit of patient's [employer]. Spoke with patient at bedside to explain [facility] services.  Written consents with authorized contact obtained.  Patient will receive a post discharge transition of care call and will be evaluated for monthly home visits for assessments and CHF/CAD disease process education.  Patient was noted to be admitted with an elevated PT/INR level.  Will provide RN [facility] for medication management.  Left contact information and [facility] literature at bedside. Made Inpatient Case Manager aware that [facility] following. Of note, [facility] Care Management services does not replace or interfere with any services that are arranged by inpatient case management or social work.  For additional questions or referrals please contact Corliss Blacker BSN RN [facility] Liaison at [phone].

## 2013-06-24 NOTE — Progress Notes (Signed)
Patient complained of lower back pain. Orders obtained to PO Percocet. Given with relief.

## 2013-06-25 LAB — LEGIONELLA ANTIGEN, URINE: LEGIONELLA ANTIGEN, URINE: NEGATIVE

## 2013-06-25 LAB — RESPIRATORY VIRUS PANEL
Adenovirus: NOT DETECTED
INFLUENZA B 1: NOT DETECTED
Influenza A H1: NOT DETECTED
Influenza A H3: NOT DETECTED
Influenza A: NOT DETECTED
METAPNEUMOVIRUS: NOT DETECTED
PARAINFLUENZA 1 A: NOT DETECTED
PARAINFLUENZA 2 A: NOT DETECTED
Parainfluenza 3: NOT DETECTED
RESPIRATORY SYNCYTIAL VIRUS A: NOT DETECTED
RHINOVIRUS: NOT DETECTED
Respiratory Syncytial Virus B: NOT DETECTED

## 2013-06-25 LAB — BASIC METABOLIC PANEL
BUN: 11 mg/dL (ref 6–23)
CALCIUM: 6.3 mg/dL — AB (ref 8.4–10.5)
CO2: 23 meq/L (ref 19–32)
CREATININE: 1.23 mg/dL — AB (ref 0.50–1.10)
Chloride: 109 mEq/L (ref 96–112)
GFR calc Af Amer: 48 mL/min — ABNORMAL LOW (ref >90)
GFR, EST NON AFRICAN AMERICAN: 42 mL/min — AB (ref >90)
Glucose, Bld: 160 mg/dL — ABNORMAL HIGH (ref 70–99)
Potassium: 4.4 mEq/L (ref 3.7–5.3)
SODIUM: 144 meq/L (ref 137–147)

## 2013-06-25 LAB — CBC
HEMATOCRIT: 28.6 % — AB (ref 36.0–46.0)
Hemoglobin: 8.6 g/dL — ABNORMAL LOW (ref 12.0–15.0)
MCH: 22.3 pg — AB (ref 26.0–34.0)
MCHC: 30.1 g/dL (ref 30.0–36.0)
MCV: 74.1 fL — ABNORMAL LOW (ref 78.0–100.0)
Platelets: 288 10*3/uL (ref 150–400)
RBC: 3.86 MIL/uL — ABNORMAL LOW (ref 3.87–5.11)
RDW: 17.3 % — AB (ref 11.5–15.5)
WBC: 9.3 10*3/uL (ref 4.0–10.5)

## 2013-06-25 LAB — GLUCOSE, CAPILLARY
GLUCOSE-CAPILLARY: 155 mg/dL — AB (ref 70–99)
Glucose-Capillary: 161 mg/dL — ABNORMAL HIGH (ref 70–99)
Glucose-Capillary: 185 mg/dL — ABNORMAL HIGH (ref 70–99)
Glucose-Capillary: 70 mg/dL (ref 70–99)

## 2013-06-25 LAB — PROTIME-INR
INR: 2.09 — ABNORMAL HIGH (ref 0.00–1.49)
Prothrombin Time: 22.8 seconds — ABNORMAL HIGH (ref 11.6–15.2)

## 2013-06-25 LAB — STREP PNEUMONIAE URINARY ANTIGEN: STREP PNEUMO URINARY ANTIGEN: NEGATIVE

## 2013-06-25 MED ORDER — INSULIN GLARGINE 100 UNIT/ML ~~LOC~~ SOLN
10.0000 [IU] | Freq: Every day | SUBCUTANEOUS | Status: DC
  Administered 2013-06-25 – 2013-06-26 (×2): 10 [IU] via SUBCUTANEOUS
  Filled 2013-06-25 (×2): qty 0.1

## 2013-06-25 MED ORDER — CALCIUM CARBONATE-VITAMIN D 500-200 MG-UNIT PO TABS
1.0000 | ORAL_TABLET | Freq: Two times a day (BID) | ORAL | Status: DC
  Administered 2013-06-25 – 2013-06-26 (×3): 1 via ORAL
  Filled 2013-06-25 (×4): qty 1

## 2013-06-25 MED ORDER — WARFARIN SODIUM 5 MG PO TABS
5.0000 mg | ORAL_TABLET | Freq: Once | ORAL | Status: AC
  Administered 2013-06-25: 5 mg via ORAL
  Filled 2013-06-25: qty 1

## 2013-06-25 MED ORDER — ONDANSETRON HCL 4 MG PO TABS
4.0000 mg | ORAL_TABLET | Freq: Three times a day (TID) | ORAL | Status: DC
  Administered 2013-06-25 – 2013-06-26 (×3): 4 mg via ORAL
  Filled 2013-06-25 (×5): qty 1

## 2013-06-25 MED ORDER — TORSEMIDE 20 MG PO TABS
20.0000 mg | ORAL_TABLET | Freq: Every day | ORAL | Status: DC
  Administered 2013-06-25 – 2013-06-26 (×2): 20 mg via ORAL
  Filled 2013-06-25 (×2): qty 1

## 2013-06-25 NOTE — Progress Notes (Signed)
CRITICAL VALUE ALERT  Critical value received:  Calcium level - 6.3  Date of notification:  06/25/2013  Time of notification:  0657  Critical value read back:yes  Nurse who received alert:  Jannifer Rodney  MD notified (1st page):  Dr. Ardeth Perfect - notified MD verbally in person  Time of first page:  7:03 AM verbally notified at this time  MD notified (2nd page): n/a  Time of second page :n/a  Responding MD:  Dr. Ardeth Perfect  Time MD responded:  [phone]

## 2013-06-25 NOTE — Evaluation (Signed)
Occupational Therapy Evaluation Patient Details Name: Joyce Terry MRN: [id number] DOB: 27-Aug-1935 Today's Date: 06/25/2013    History of Present Illness Complains of generalized weakness and vomiting and dry heaves for the past several days. She developed diffuse abdominal. She admits to slight cough   Clinical Impression   Pt at sup/set up - min guard A level with ADLs due to balance deficits. No further acute OT indicated at this time. Recommend acute PT eval for functional mobility safety and balance    Follow Up Recommendations  No OT follow up    Equipment Recommendations  None recommended by OT    Recommendations for Other Services  PT consult     Precautions / Restrictions Precautions Precautions: Fall Restrictions Weight Bearing Restrictions: No      Mobility Bed Mobility Overal bed mobility: Modified Independent                Transfers Overall transfer level: Needs assistance Equipment used: None;1 person hand held assist Transfers: Sit to/from Stand Sit to Stand: Supervision;Min guard         General transfer comment: pt reaching for furniture during standing to ambualting transition    Balance Overall balance assessment: Needs assistance Sitting-balance support: No upper extremity supported;Feet supported Sitting balance-Leahy Scale: Good     Standing balance support: No upper extremity supported;Single extremity supported;During functional activity Standing balance-Leahy Scale: Fair                      ADL   Grooming: Wash/dry hands;Wash/dry face;Standing;Supervision/safety;Set up   Upper Body Dressing : Set up;Supervision/safety;Min guard;Standing Lower Body Bathing: Set up;Min guard;Sit to/from stand Lower Body Dressing: Min guard;Sit to/from stand Toilet Transfer: Supervision/safety;Min guard;Regular Toilet;Grab bars Toileting- Water quality scientist and Hygiene: Supervision/safety;Min guard;Sit to/from stand      General ADL Comments: min guard A for safety during ADL mobillty/standing during ADLs due to decreased balance. Pt stood at sink for grooming, ADLs and simulated ADLs     Vision  wears glasses                   Perception Perception Perception Tested?: No         Pertinent Vitals/Pain No c/o pain, VSS, edema B feet     Hand Dominance Right   Extremity/Trunk Assessment Upper Extremity Assessment Upper Extremity Assessment: Overall WFL for tasks assessed   Lower Extremity Assessment Lower Extremity Assessment: Defer to PT evaluation   Cervical / Trunk Assessment Cervical / Trunk Assessment: Normal   Communication Communication Communication: No difficulties   Cognition Arousal/Alertness: Awake/alert Behavior During Therapy: WFL for tasks assessed/performed Overall Cognitive Status: Within Functional Limits for tasks assessed                     General Comments   Pt pleasant and cooperative          Home Living Family/patient expects to be discharged to:: Private residence Living Arrangements: Alone Available Help at Discharge: Family Type of Home: House Home Access: Stairs to enter Technical brewer of Steps: 4 Entrance Stairs-Rails: Right;Left;Can reach both Home Layout: One level     Bathroom Shower/Tub: Teacher, early years/pre: Standard     Home Equipment: Shower seat;Cane - single point;Walker - 2 wheels;Walker - standard          Prior Functioning/Environment Level of Independence: Independent with assistive device(s)        Comments: uses cane when out in community.  Independent with ADls, home mgt, cooking and still drives    OT Diagnosis:     OT Problem List:     OT Treatment/Interventions:      OT Goals(Current goals can be found in the care plan section) Acute Rehab OT Goals Patient Stated Goal: go home OT Goal Formulation: With patient  OT Frequency:     Barriers to D/C:  none          End  of Session:    Activity Tolerance: Patient tolerated treatment well Patient left: in bed;with call bell/phone within reach   Time: 1202-1226 OT Time Calculation (min): 24 min Charges:  OT General Charges $OT Visit: 1 Procedure OT Evaluation $Initial OT Evaluation Tier I: 1 Procedure OT Treatments $Therapeutic Activity: 8-22 mins G-Codes: OT G-codes **NOT FOR INPATIENT CLASS** Functional Limitation: Self care Self Care Current Status (H1505): At least 1 percent but less than 20 percent impaired, limited or restricted Self Care Goal Status (W9794): At least 1 percent but less than 20 percent impaired, limited or restricted Self Care Discharge Status [phone]): At least 1 percent but less than 20 percent impaired, limited or restricted  Britt Bottom 06/25/2013, 1:10 PM

## 2013-06-25 NOTE — Progress Notes (Signed)
Physician Daily Progress Note  Subjective: Ate beef stew, eggs, etc yesterday Did have some progression of LE edema but no dyspnea  Sat in chair for 1 hour yest Having some nausea still   Objective: Vital signs in last 24 hours: Temp:  [97.4 F (36.3 C)-98 F (36.7 C)] 97.4 F (36.3 C) (03/31 0621) Pulse Rate:  [71-75] 71 (03/31 0621) Resp:  [18] 18 (03/30 2035) BP: (122-146)/(49-70) 146/61 mmHg (03/31 0621) SpO2:  [96 %-98 %] 97 % (03/31 0621) Weight:  [103.42 kg (228 lb)] 103.42 kg (228 lb) (03/31 7672) Weight change: 3.629 kg (8 lb) Last BM Date: 06/24/13  CBG (last 3)   Recent Labs  06/24/13 1618 06/24/13 2138 06/25/13 0558  GLUCAP 99 116* 161*    Intake/Output from previous day:  Intake/Output Summary (Last 24 hours) at 06/25/13 0709 Last data filed at 06/25/13 0253  Gross per 24 hour  Intake    840 ml  Output    775 ml  Net     65 ml   03/30 0701 - 03/31 0700 In: 840 [P.O.:840] Out: 775 [Urine:775]  Physical Exam General appearance: WF in mild distress  Eyes: no scleral icterus Throat: oropharynx moist without erythema Resp: CTAB, no wheezes, rales  Cardio: RRR, no MRG  GI: soft, non-tender; bowel sounds normal; no masses,  no organomegaly Extremities: no clubbing, cyanosis. Has 1+ edema in LE    Lab Results:  Recent Labs  06/24/13 0417 06/25/13 0530  NA 146 144  K 3.5* 4.4  CL 107 109  CO2 26 23  GLUCOSE 122* 160*  BUN 12 11  CREATININE 1.12* 1.23*  CALCIUM 6.4* 6.3*     Recent Labs  06/23/13 1426  AST 19  ALT 12  ALKPHOS 70  BILITOT 0.3  PROT 6.7  ALBUMIN 2.7*     Recent Labs  06/23/13 1426 06/24/13 0417 06/25/13 0530  WBC 10.3 9.3 9.3  NEUTROABS 9.0*  --   --   HGB 10.4* 8.8* 8.6*  HCT 33.0* 28.5* 28.6*  MCV 72.4* 73.3* 74.1*  PLT 436* 301 288    Lab Results  Component Value Date   INR 2.09* 06/25/2013   INR 8.53* 06/24/2013   INR 5.60* 06/23/2013     Recent Labs  06/23/13 1426  TROPONINI <0.30    No  results found for this basename: TSH, T4TOTAL, FREET3, T3FREE, THYROIDAB,  in the last 72 hours  No results found for this basename: VITAMINB12, FOLATE, FERRITIN, TIBC, IRON, RETICCTPCT,  in the last 72 hours  Micro Results: No results found for this or any previous visit (from the past 240 hour(s)).  Studies/Results: Ct Chest Wo Contrast  06/23/2013   CLINICAL DATA:  Recent history of pneumonia, generalized weakness, on the lays nausea and intermittent body aches for 2 weeks  EXAM: CT CHEST WITHOUT CONTRAST  TECHNIQUE: Multidetector CT imaging of the chest was performed following the standard protocol without IV contrast.  COMPARISON:  CT abdomen/ pelvis 06/17/2013; prior chest x-ray 03/02/2011  FINDINGS: Mediastinum: Unremarkable CT appearance of the thyroid gland. No suspicious mediastinal or hilar adenopathy. No soft tissue mediastinal mass. The thoracic esophagus is unremarkable.  Heart/Vascular: Limited evaluation in the absence of intravenous contrast. There is a left subclavian approach cardiac rhythm maintenance device with the single lead terminating in the right ventricular apex. The heart is enlarged. There is both left ventricular dilatation and right ventricular and atrial dilatation. Atherosclerotic calcifications noted within the left anterior descending, circumflex and right coronary  arteries. The intracardiac blood pool is hypodense relative to the adjacent myocardium consistent with anemia. Calcification of the mitral valve annulus and aortic valve. No pericardial effusion. Normal pulmonary arterial size. Unremarkable 3 vessel aorta without aneurysmal dilatation.  Lungs/Pleura: Peribronchovascular distribution of ground-glass attenuation airspace opacities and more consolidative changes in the posterior aspect of the right upper lobe. Additionally, there are a few scattered foci of ground-glass attenuation opacity in anterior aspect and lateral aspect of the right upper lobe as well as  within the left upper and lower lobes. Small foci of adjacent more solid nodular opacities noted in the left lower lobe measuring 4.4 and 3.7 mm on image 41 of series 3. No pleural effusion. No significant emphysematous change.  Bones/Soft Tissues: No acute fracture or aggressive appearing lytic or blastic osseous lesion. Multilevel degenerative change throughout the thoracic spine.  Upper Abdomen: Surgical changes of prior cholecystectomy. Otherwise, unremarkable visualized upper abdomen.  IMPRESSION: 1. Overall findings are most consistent with a multifocal infectious/inflammatory process such as multifocal bronchopneumonia, or atypical/viral pneumonia. The majority of the findings are in the posterior aspect of the right upper lobe with additional scattered areas of involvement elsewhere in the right upper lobe, left upper lobe and left lower lobe. Recommend follow-up chest CT in 4- 6 weeks following appropriate course of therapy to exclude the less likely possibility of multifocal low-grade adenocarcinoma. 2. Small, nonspecific pulmonary nodules in the left lower lobe measure up to 4 mm. Was these are likely the sequelae of old granulomatous disease, or part of the multifocal infectious/inflammatory process described above, they can also be further evaluated on follow-up CT. 3. Atherosclerosis including multivessel coronary artery disease. 4. Cardiomegaly including both left ventricular dilatation and right heart enlargement. 5. The intracardiac blood pool is hypodense relative to the adjacent myocardium consistent with anemia. 6. Aortic valve and mitral valve annulus calcification.   Electronically Signed   By: Jacqulynn Cadet M.D.   On: 06/23/2013 17:16     Medications: Scheduled: . calcium-vitamin D  1 tablet Oral BID  . carvedilol  3.125 mg Oral BID WC  . escitalopram  10 mg Oral Daily  . ezetimibe-simvastatin  1 tablet Oral QHS  . fentaNYL  50 mcg Transdermal Q72H  . glyBURIDE  10 mg Oral BID WC    And  . metFORMIN  1,000 mg Oral BID WC  . insulin aspart  0-15 Units Subcutaneous TID WC  . insulin aspart  0-5 Units Subcutaneous QHS  . levofloxacin  500 mg Oral Daily  . losartan  25 mg Oral Daily  . ondansetron (ZOFRAN) IV  4 mg Intravenous 4 times per day  . pantoprazole  40 mg Oral BID  . potassium chloride  40 mEq Oral BID  . sodium chloride  3 mL Intravenous Q12H  . Warfarin - Pharmacist Dosing Inpatient   Does not apply q1800   Continuous:    Assessment/Plan:  CAP - completes 7 days of levaquin today. Stop date has been placed.  checking flu/legionella  to ensure no complicating factors. Strep neg . No fever or leukocytosis   Nausea/emesis - now hydrating on her own. zofran qid . Improving   CHF - stopping IVF and resuming home diuretic given now hydrating herself  Hypocalcemia - w/ correction, only slight decrease. Supplementing PO   Hypokalemia - resolved   A fib w/ supratherapeutic INR - no signs of bleed. Coumadin per pharmacy. s/p Vit k . Daily INRs . On tele   DM - Placing  on SSI given poor intake . Resumed her home PO meds but holding home insulin. Currently only eating 25-50% of meals   DVT Prophylaxis - coumadin  Dispo - home when stronger and tolerating PO . Possibly tomorrow    LOS: 2 days   Maxum Cassarino 06/25/2013, 7:09 AM

## 2013-06-25 NOTE — Progress Notes (Signed)
Patient has a five pound weight gain most likely due to the fact that patient had IV fluids infusing during the night at 118ml/hr. On-call doctor was called this morning prior to weight taken and IV fluids were discontinued per doctor's order. Will continue to monitor to end of shift.

## 2013-06-25 NOTE — Progress Notes (Signed)
ANTICOAGULATION CONSULT NOTE - Follow Up Consult  Pharmacy Consult:  Coumadin Indication: atrial fibrillation  Allergies  Allergen Reactions  . Demerol Nausea And Vomiting  . Penicillins Hives  . Tape Other (See Comments)    "surgical tape tears skin up; paper tape is ok"  . Morphine And Related Itching  . Codeine Nausea And Vomiting    Patient Measurements: Height: 5' 8.5" (174 cm) Weight: 228 lb (103.42 kg) (b scale; rn notified of weight gain) IBW/kg (Calculated) : 65.05  Vital Signs: Temp: 97.4 F (36.3 C) (03/31 0621) Temp src: Oral (03/31 0621) BP: 146/61 mmHg (03/31 0621) Pulse Rate: 71 (03/31 0621)  Labs:  Recent Labs  06/23/13 1426 06/24/13 0417 06/25/13 0530  HGB 10.4* 8.8* 8.6*  HCT 33.0* 28.5* 28.6*  PLT 436* 301 288  LABPROT 48.4* 68.4* 22.8*  INR 5.60* 8.53* 2.09*  CREATININE 1.18* 1.12* 1.23*  TROPONINI <0.30  --   --     Estimated Creatinine Clearance: 50.2 ml/min (by C-G formula based on Cr of 1.23).    Assessment: 85 YOF admitted for possible CAP and dehydration continued on Levaquin from PTA.Marland Kitchen  She is also on Coumadin from [facility] for history of Afib.  INR was supra-therapeutic on admit likely due to DDI with Levaquin.  INR increased to 8.53 yesterday, but corrected to 2.09 today after receiving 1 mg SQ vitamin K and 2.5 mg po vitamin K.  No bleeding documented.  Hgb stable, platelet count declining.  No heparin products being administered.  Goal of Therapy:  INR 2-3 Monitor platelets by anticoagulation protocol: Yes    Plan:  - Resume Coumadin with 5 mg x 1 . - Daily PT / INR.  Uvaldo Rising, BCPS  Clinical Pharmacist Pager [phone]  06/25/2013 2:02 PM

## 2013-06-25 NOTE — Progress Notes (Signed)
UR completed. Patient changed to inpatient- Required IVF @125cc /hr and IV antiemetics scheduled.

## 2013-06-25 NOTE — Progress Notes (Signed)
PT Cancellation Note  Patient Details Name: Joyce Terry MRN: [id number] DOB: 09-Jul-1935   Cancelled Treatment:    Reason Eval/Treat Not Completed: Patient not medically ready: Pt initially refusing because "my blood is not right." Informed her that her INR was now 2.09 and safe for her to participate. Then refused because she was having back pain. Informed her she had pain medicine at 5:20 am and not due for another 5 hrs. Then refused because she was nauseous. (She had nausea medicine at 5:20 and not due until 11:20). She continued to refuse, stating she'd been up to chair yesterday and walking to bathroom and didn't want to get up right now.  Will return later today.   Dru Laurel 06/25/2013, 8:33 AM Pager [phone]

## 2013-06-25 NOTE — Progress Notes (Signed)
Notified on-call MD, Dr. Joylene Draft, of patient being very concerned of having fluid overload for her feet have begun to show some edema plus 2/3. Patient is now eating and drinking. Telephone orders given to discontinue fluids and saline lock peripheral IV. Will carry out orders and continue to monitor patient to end of shift.

## 2013-06-25 NOTE — Evaluation (Signed)
Physical Therapy Evaluation Patient Details Name: Joyce Terry MRN: [id number] DOB: 1935/04/20 Today's Date: 06/25/2013   History of Present Illness  Complains of generalized weakness and vomiting and dry heaves for the past several days. She developed diffuse abdominal. She admits to slight cough  Clinical Impression  Pt admitted with pneumonia and nausea. Pt lives alone with limited assistance from family members. She will need to be safe to be alone in her home environment. Anticipate this may take 1-2 days of increased activity. Pt currently with functional limitations due to the deficits listed below (see PT Problem List).  Pt will benefit from skilled PT to increase their independence and safety with mobility to allow discharge to the venue listed below.       Follow Up Recommendations Home health PT;Supervision - Intermittent    Equipment Recommendations  None recommended by PT  (she has DME)   Recommendations for Other Services       Precautions / Restrictions Precautions Precautions: Fall Restrictions Weight Bearing Restrictions: No      Mobility  Bed Mobility Overal bed mobility: Modified Independent                Transfers Overall transfer level: Needs assistance Equipment used: None Transfers: Sit to/from Stand Sit to Stand: Min guard         General transfer comment: pt reaching for furniture during standing to ambualting transition  Ambulation/Gait Ambulation/Gait assistance: Min guard Ambulation Distance (Feet): 40 Feet Assistive device: None Gait Pattern/deviations: Step-through pattern;Decreased stride length;Wide base of support;Antalgic   Gait velocity interpretation: Below normal speed for age/gender General Gait Details: reaching to hold onto counter/furniture; slightly antagic with reports of back pain; distance limited due to pt is on droplet precautions and not to leave room  Stairs            Wheelchair Mobility     Modified Rankin (Stroke Patients Only)       Balance Overall balance assessment: Needs assistance Sitting-balance support: No upper extremity supported;Feet supported Sitting balance-Leahy Scale: Good     Standing balance support: No upper extremity supported Standing balance-Leahy Scale: Fair       Tandem Stance - Right Leg: 20   Rhomberg - Eyes Opened:  (unable to obtain position)           Pertinent Vitals/Pain Reports chronic back pain; requesting pain medicine; RN made aware    Home Living Family/patient expects to be discharged to:: Private residence Living Arrangements: Alone Available Help at Discharge: Family;Available PRN/intermittently ("check-in" per pt; alone mostly) Type of Home: House Home Access: Stairs to enter Entrance Stairs-Rails: Right;Left;Can reach both Entrance Stairs-Number of Steps: 4 Home Layout: One level Home Equipment: Shower seat;Cane - single point;Walker - 2 wheels;Walker - standard      Prior Function Level of Independence: Independent with assistive device(s)         Comments: uses cane when out in community. Independent with ADls, home mgt, cooking and still drives     Hand Dominance   Dominant Hand: Right    Extremity/Trunk Assessment   Upper Extremity Assessment: Defer to OT evaluation           Lower Extremity Assessment: RLE deficits/detail;LLE deficits/detail RLE Deficits / Details: continues to have edema that makes her feet feel odd and makes her feel unsteady LLE Deficits / Details: continues to have edema that makes her feet feel odd and makes her feel unsteady  Cervical / Trunk Assessment: Normal  Communication  Communication: No difficulties  Cognition Arousal/Alertness: Awake/alert Behavior During Therapy: WFL for tasks assessed/performed Overall Cognitive Status: Within Functional Limits for tasks assessed                      General Comments General comments (skin integrity, edema,  etc.): Continues to be limtied by nausea    Exercises        Assessment/Plan    PT Assessment Patient needs continued PT services  PT Diagnosis Difficulty walking   PT Problem List Decreased activity tolerance;Decreased balance;Decreased mobility;Decreased knowledge of use of DME;Pain  PT Treatment Interventions DME instruction;Gait training;Stair training;Functional mobility training;Therapeutic activities;Balance training;Patient/family education   PT Goals (Current goals can be found in the Care Plan section) Acute Rehab PT Goals Patient Stated Goal: go home PT Goal Formulation: With patient Time For Goal Achievement: 06/27/13 Potential to Achieve Goals: Good    Frequency Min 3X/week   Barriers to discharge Decreased caregiver support lives alone    End of Session Equipment Utilized During Treatment: Gait belt Activity Tolerance: Patient limited by fatigue Patient left: in chair;with call bell/phone within reach         Time: 6834-1962 PT Time Calculation (min): 21 min   Charges:   PT Evaluation $Initial PT Evaluation Tier I: 1 Procedure PT Treatments $Gait Training: 8-22 mins   PT G Codes:          [id number] 07-Jul-2013, 2:14 PM Pager [phone]

## 2013-06-26 ENCOUNTER — Encounter: Payer: Self-pay | Admitting: *Deleted

## 2013-06-26 LAB — BASIC METABOLIC PANEL
BUN: 15 mg/dL (ref 6–23)
CALCIUM: 6.6 mg/dL — AB (ref 8.4–10.5)
CHLORIDE: 107 meq/L (ref 96–112)
CO2: 21 mEq/L (ref 19–32)
CREATININE: 1.29 mg/dL — AB (ref 0.50–1.10)
GFR, EST AFRICAN AMERICAN: 46 mL/min — AB (ref >90)
GFR, EST NON AFRICAN AMERICAN: 39 mL/min — AB (ref >90)
Glucose, Bld: 198 mg/dL — ABNORMAL HIGH (ref 70–99)
Potassium: 5.3 mEq/L (ref 3.7–5.3)
Sodium: 141 mEq/L (ref 137–147)

## 2013-06-26 LAB — GLUCOSE, CAPILLARY
GLUCOSE-CAPILLARY: 191 mg/dL — AB (ref 70–99)
Glucose-Capillary: 184 mg/dL — ABNORMAL HIGH (ref 70–99)

## 2013-06-26 LAB — CBC
HCT: 29.2 % — ABNORMAL LOW (ref 36.0–46.0)
Hemoglobin: 9 g/dL — ABNORMAL LOW (ref 12.0–15.0)
MCH: 22.7 pg — AB (ref 26.0–34.0)
MCHC: 30.8 g/dL (ref 30.0–36.0)
MCV: 73.7 fL — AB (ref 78.0–100.0)
PLATELETS: 305 10*3/uL (ref 150–400)
RBC: 3.96 MIL/uL (ref 3.87–5.11)
RDW: 17.5 % — ABNORMAL HIGH (ref 11.5–15.5)
WBC: 10.8 10*3/uL — ABNORMAL HIGH (ref 4.0–10.5)

## 2013-06-26 LAB — PROTIME-INR
INR: 1.57 — ABNORMAL HIGH (ref 0.00–1.49)
Prothrombin Time: 18.3 seconds — ABNORMAL HIGH (ref 11.6–15.2)

## 2013-06-26 MED ORDER — WARFARIN SODIUM 5 MG PO TABS
5.0000 mg | ORAL_TABLET | Freq: Once | ORAL | Status: DC
  Filled 2013-06-26: qty 1

## 2013-06-26 MED ORDER — CALCIUM CARBONATE-VITAMIN D 500-200 MG-UNIT PO TABS: 1.0000 | ORAL_TABLET | Freq: Two times a day (BID) | ORAL | Status: DC

## 2013-06-26 MED ORDER — INSULIN GLARGINE 100 UNIT/ML ~~LOC~~ SOLN: 15.0000 [IU] | Freq: Every day | SUBCUTANEOUS | Status: DC

## 2013-06-26 NOTE — Progress Notes (Signed)
PT Cancellation Note  Patient Details Name: COLEEN CARDIFF MRN: [id number] DOB: Jun 12, 1935   Cancelled Treatment:    Reason Eval/Treat Not Completed: pt declined to participate with therapy at this time. Explained to pt that she may d/c before PT gets back around to check again-pt acknowledged this.   Weston Anna, MPT Pager: [phone]

## 2013-06-26 NOTE — Progress Notes (Signed)
ANTICOAGULATION CONSULT NOTE - Follow Up Consult  Pharmacy Consult:  Coumadin Indication: atrial fibrillation  Allergies  Allergen Reactions  . Demerol Nausea And Vomiting  . Penicillins Hives  . Tape Other (See Comments)    "surgical tape tears skin up; paper tape is ok"  . Morphine And Related Itching  . Codeine Nausea And Vomiting    Patient Measurements: Height: 5' 8.5" (174 cm) Weight: 227 lb 9.6 oz (103.239 kg) (scale B) IBW/kg (Calculated) : 65.05  Vital Signs: Temp: 97.4 F (36.3 C) (04/01 0549) Temp src: Oral (04/01 0549) BP: 114/61 mmHg (04/01 0549) Pulse Rate: 71 (04/01 0549)  Labs:  Recent Labs  06/23/13 1426 06/24/13 0417 06/25/13 0530 06/26/13 0257  HGB 10.4* 8.8* 8.6* 9.0*  HCT 33.0* 28.5* 28.6* 29.2*  PLT 436* 301 288 305  LABPROT 48.4* 68.4* 22.8* 18.3*  INR 5.60* 8.53* 2.09* 1.57*  CREATININE 1.18* 1.12* 1.23* 1.29*  TROPONINI <0.30  --   --   --     Estimated Creatinine Clearance: 47.8 ml/min (by C-G formula based on Cr of 1.29).    Assessment: 57 YOF continues on Coumadin from PTA for history of Afib.  INR was supra-therapeutic on admit likely due to DDI with Levaquin and N/V.  S/p Vitamin K 1mg  SQ and 2.5mg  PO on 06/24/13.  INR decreased to sub-therapeutic level today and expect a slow rise in INR.  No bleeding reported   Goal of Therapy:  INR 2-3 Monitor platelets by anticoagulation protocol: Yes    Plan:  - Repeat Coumadin 5mg  PO today - Daily PT / INR - Watch WBC - Consider holding or decreasing KCL    Gerhard Rappaport D. Mina Marble, PharmD, BCPS Pager:  [phone] 06/26/2013, 10:18 AM

## 2013-06-26 NOTE — Discharge Summary (Addendum)
Physician Discharge Summary    JAMMIE TROUP  MR#: [id number]  DOB:[birth year]  Date of Admission: 06/23/2013 Date of Discharge: 06/26/2013  Attending Physician:Jhaniya Briski  Patient's VQM:[id number],JKDTOIZ A, MD  Discharge Diagnoses: Active Problems:   Dehydration   Discharge Medications:   Medication List    STOP taking these medications       levofloxacin 500 MG tablet  Commonly known as:  LEVAQUIN      TAKE these medications       calcium-vitamin D 500-200 MG-UNIT per tablet  Commonly known as:  OSCAL WITH D  Take 1 tablet by mouth 2 (two) times daily.     carvedilol 3.125 MG tablet  Commonly known as:  COREG  Take 3.125 mg by mouth 2 (two) times daily with a meal.     escitalopram 10 MG tablet  Commonly known as:  LEXAPRO  Take 10 mg by mouth daily.     ezetimibe-simvastatin 10-20 MG per tablet  Commonly known as:  VYTORIN  Take 1 tablet by mouth at bedtime.     fentaNYL 50 MCG/HR  Commonly known as:  DURAGESIC - dosed mcg/hr  Place 1 patch onto the skin every 3 (three) days.     Fluticasone-Salmeterol 250-50 MCG/DOSE Aepb  Commonly known as:  ADVAIR  Inhale 1 puff into the lungs every 12 (twelve) hours as needed. For shortness of breath     glyBURIDE-metformin 5-500 MG per tablet  Commonly known as:  GLUCOVANCE  Take 2 tablets by mouth 2 (two) times daily with a meal.     insulin aspart 100 UNIT/ML injection  Commonly known as:  novoLOG  Inject 4-8 Units into the skin 2 (two) times daily. If blood sugar>250, use 7-8 units. If if <200, use 4-5 units     insulin glargine 100 UNIT/ML injection  Commonly known as:  LANTUS  Inject 0.15 mLs (15 Units total) into the skin at bedtime.     losartan 25 MG tablet  Commonly known as:  COZAAR  Take 1 tablet (25 mg total) by mouth daily.     nitroGLYCERIN 0.4 MG SL tablet  Commonly known as:  NITROSTAT  Place 1 tablet (0.4 mg total) under the tongue every 5 (five) minutes as needed for chest pain.      ondansetron 4 MG disintegrating tablet  Commonly known as:  ZOFRAN-ODT  Take 4 mg by mouth every 4 (four) hours as needed for nausea.     OVER THE COUNTER MEDICATION  Place 1-2 drops into both eyes daily as needed. For dry/itchy eyes --saline drops     oxyCODONE-acetaminophen 10-325 MG per tablet  Commonly known as:  PERCOCET  Take 1 tablet by mouth every 4 (four) hours as needed. For pain     pantoprazole 40 MG tablet  Commonly known as:  PROTONIX  40 mg 2 (two) times daily.     torsemide 20 MG tablet  Commonly known as:  DEMADEX  Take 1 tablet (20 mg total) by mouth daily.     warfarin 5 MG tablet  Commonly known as:  COUMADIN  Take 2.5-5 mg by mouth daily. Take 2.5 mg Mon,Wed and Fri and all other days take 5 mg     zolpidem 10 MG tablet  Commonly known as:  AMBIEN  Take 5 mg by mouth at bedtime as needed. For sleep        Hospital Procedures: Ct Chest Wo Contrast  06/23/2013   CLINICAL DATA:  Recent history of pneumonia,  generalized weakness, on the lays nausea and intermittent body aches for 2 weeks  EXAM: CT CHEST WITHOUT CONTRAST  TECHNIQUE: Multidetector CT imaging of the chest was performed following the standard protocol without IV contrast.  COMPARISON:  CT abdomen/ pelvis 06/17/2013; prior chest x-ray 03/02/2011  FINDINGS: Mediastinum: Unremarkable CT appearance of the thyroid gland. No suspicious mediastinal or hilar adenopathy. No soft tissue mediastinal mass. The thoracic esophagus is unremarkable.  Heart/Vascular: Limited evaluation in the absence of intravenous contrast. There is a left subclavian approach cardiac rhythm maintenance device with the single lead terminating in the right ventricular apex. The heart is enlarged. There is both left ventricular dilatation and right ventricular and atrial dilatation. Atherosclerotic calcifications noted within the left anterior descending, circumflex and right coronary arteries. The intracardiac blood pool is hypodense  relative to the adjacent myocardium consistent with anemia. Calcification of the mitral valve annulus and aortic valve. No pericardial effusion. Normal pulmonary arterial size. Unremarkable 3 vessel aorta without aneurysmal dilatation.  Lungs/Pleura: Peribronchovascular distribution of ground-glass attenuation airspace opacities and more consolidative changes in the posterior aspect of the right upper lobe. Additionally, there are a few scattered foci of ground-glass attenuation opacity in anterior aspect and lateral aspect of the right upper lobe as well as within the left upper and lower lobes. Small foci of adjacent more solid nodular opacities noted in the left lower lobe measuring 4.4 and 3.7 mm on image 41 of series 3. No pleural effusion. No significant emphysematous change.  Bones/Soft Tissues: No acute fracture or aggressive appearing lytic or blastic osseous lesion. Multilevel degenerative change throughout the thoracic spine.  Upper Abdomen: Surgical changes of prior cholecystectomy. Otherwise, unremarkable visualized upper abdomen.  IMPRESSION: 1. Overall findings are most consistent with a multifocal infectious/inflammatory process such as multifocal bronchopneumonia, or atypical/viral pneumonia. The majority of the findings are in the posterior aspect of the right upper lobe with additional scattered areas of involvement elsewhere in the right upper lobe, left upper lobe and left lower lobe. Recommend follow-up chest CT in 4- 6 weeks following appropriate course of therapy to exclude the less likely possibility of multifocal low-grade adenocarcinoma. 2. Small, nonspecific pulmonary nodules in the left lower lobe measure up to 4 mm. Was these are likely the sequelae of old granulomatous disease, or part of the multifocal infectious/inflammatory process described above, they can also be further evaluated on follow-up CT. 3. Atherosclerosis including multivessel coronary artery disease. 4. Cardiomegaly  including both left ventricular dilatation and right heart enlargement. 5. The intracardiac blood pool is hypodense relative to the adjacent myocardium consistent with anemia. 6. Aortic valve and mitral valve annulus calcification.   Electronically Signed   By: Jacqulynn Cadet M.D.   On: 06/23/2013 17:16   Ct Abdomen Pelvis W Contrast  06/17/2013   CLINICAL DATA:  78 year old female abdominal pelvic pain, nausea, diarrhea and elevated white count.  EXAM: CT ABDOMEN AND PELVIS WITH CONTRAST  TECHNIQUE: Multidetector CT imaging of the abdomen and pelvis was performed using the standard protocol following bolus administration of intravenous contrast.  CONTRAST:  52mL OMNIPAQUE IOHEXOL 300 MG/ML  SOLN  COMPARISON:  04/17/2012 CT  FINDINGS: Cardiomegaly and pacemaker lead again noted.  The liver, spleen, adrenal glands and pancreas are unremarkable.  Moderate bilateral renal cortical atrophy identified. The kidneys are otherwise unremarkable.  A trace amount of free pelvic fluid is nonspecific.  Descending and sigmoid colonic diverticulosis noted without evidence of diverticulitis.  There is no evidence bowel obstruction, pneumoperitoneum or abscess.  No enlarged lymph nodes, biliary dilatation or abdominal aortic aneurysm identified.  No acute or suspicious bony abnormalities are present.  Left hip replacement identified.  IMPRESSION: Trace amount of free pelvic fluid which is nonspecific.  No other acute abnormalities identified.  Colonic diverticulosis without diverticulitis.  Moderate bilateral renal cortical thinning.   Electronically Signed   By: Laveda Abbe M.D.   On: 06/17/2013 16:12    History of Present Illness: Patient admitted for intol to PO 2/2 CAP   Hospital Course: CAP - CT confirmed the PNA. Legionella/strep/flu neg. She completed 7 days of levoquin and remained AF w/o increased O2 requirement.   A fib w/ supratherapeutic INR - INR peaked around 8 but trended back down to < 2 prior to  discharge. She will resume her prior controlled dosing of coumadin and we will follow INR in the office   Intractable emesis - this is now resolved w/ q8 hour zofran. She is no longer requiring IVF.   CHF - IVF did cause some weight gain but we will resume her home diuretic and allow her weights to stabilize. No increased O2 requirement   Hypocalcemia - she was started on supplements and numbers were improving. Recheck at f/u visit   DM - she was on lantus 30U daily, we will decrease this to 15U dialy while her PO intake is still not full strength. This can be increased back to 30U as outpt when appropriate    Day of Discharge Exam BP 114/61  Pulse 71  Temp(Src) 97.4 F (36.3 C) (Oral)  Resp 18  Ht 5' 8.5" (1.74 m)  Wt 103.239 kg (227 lb 9.6 oz)  BMI 34.10 kg/m2  SpO2 97%  Physical Exam: General appearance: WF in NAD  Eyes: no scleral icterus Throat: oropharynx moist without erythema Resp: CTAB, no wheezing, rales  Cardio: RRR, no MRG  GI: soft, non-tender; bowel sounds normal; no masses,  no organomegaly Extremities: no clubbing, cyanosis or edema  Discharge Labs:  Recent Labs  06/25/13 0530 06/26/13 0257  NA 144 141  K 4.4 5.3  CL 109 107  CO2 23 21  GLUCOSE 160* 198*  BUN 11 15  CREATININE 1.23* 1.29*  CALCIUM 6.3* 6.6*    Recent Labs  06/23/13 1426  AST 19  ALT 12  ALKPHOS 70  BILITOT 0.3  PROT 6.7  ALBUMIN 2.7*    Recent Labs  06/23/13 1426  06/25/13 0530 06/26/13 0257  WBC 10.3  < > 9.3 10.8*  NEUTROABS 9.0*  --   --   --   HGB 10.4*  < > 8.6* 9.0*  HCT 33.0*  < > 28.6* 29.2*  MCV 72.4*  < > 74.1* 73.7*  PLT 436*  < > 288 305  < > = values in this interval not displayed. Lab Results  Component Value Date   INR 1.57* 06/26/2013   INR 2.09* 06/25/2013   INR 8.53* 06/24/2013    Recent Labs  06/23/13 1426  TROPONINI <0.30   No results found for this basename: TSH, T4TOTAL, FREET3, T3FREE, THYROIDAB,  in the last 72 hours No results  found for this basename: VITAMINB12, FOLATE, FERRITIN, TIBC, IRON, RETICCTPCT,  in the last 72 hours  Discharge instructions:     Discharge Orders   Future Appointments Provider Department Dept Phone   08/12/2013 1:45 PM Vesta Mixer, MD [facility] [place] Office [phone]   09/09/2013 12:40 PM [facility] [place] Office [phone]  Future Orders Complete By Expires   Call MD for:  persistant nausea and vomiting  As directed    Diet - low sodium heart healthy  As directed    Increase activity slowly  As directed      01-Home or Self Care   Disposition: home, niece will be home tonight and tomorrow   Follow-up Appts: Follow-up with Dr. Reynaldo Minium at [facility] in 1 day. We will  Call for appointment.  Condition on Discharge: stable   Tests Needing Follow-up: INR , BMet ( calcium )   Time spent in discharge (includes decision making & examination of pt): 45 minutes    Signed: Zuhayr Deeney 06/26/2013, 7:30 AM

## 2013-06-26 NOTE — Progress Notes (Signed)
Lab result for the Flu is negative. Patient is now off Droplet Isolation.

## 2013-06-26 NOTE — Care Management Note (Signed)
    Page 1 of 1   06/26/2013     10:42:21 AM   CARE MANAGEMENT NOTE 06/26/2013  Patient:  JAMONICA, SCHOFF   Account Number:  [id number]  Date Initiated:  06/26/2013  Documentation initiated by:  [facility]  Subjective/Objective Assessment:   78 yo female Chief Complaint:  Dry heaves, "elevated WBC count", and progressive weakness //Home alone     Action/Plan:   Rehydration and antiemetics//Access for [facility] services   Anticipated DC Date:  06/26/2013   Anticipated DC Plan:  [place]         [facility] Choice  HOME HEALTH   Choice offered to / List presented to:  C-1 Patient        [place] arranged  HH-1 RN  [place].   Status of service:  Completed, signed off Medicare Important Message given?   (If response is "NO", the following Medicare IM given date fields will be blank) Date Medicare IM given:   Date Additional Medicare IM given:    Discharge Disposition:    Per UR Regulation:    If discussed at Long Length of Stay Meetings, dates discussed:    Comments:  06/26/13 Carmen, RN, BSN, NCM [phone] CM spoke with patient concerning discharge planning. Pt offered choice for [facility] for [facility] services upon discharge. Per pt choice [facility] to provide [facility] services.  [facility] rep Janae Sauce, RN contacted concerning new referral. Pt to discharge home alone. Pt request HHRN for disease management upon discharge. No DME needs stated.

## 2013-06-26 NOTE — Progress Notes (Signed)
Notified Dr. Ardeth Perfect of patient having bleeding from hemorrhoids. Patient was concerned for it was bright red blood. Orders to continue to monitor patient. Nurse signing off at this time. Oncoming nurse also made aware.

## 2013-06-26 NOTE — Progress Notes (Signed)
Pt a/o, pt being dc to home with family, pt given dc instructions, pt verbalized understanding, pt leaving via wheelchair with family, pt stable

## 2013-07-05 ENCOUNTER — Encounter: Payer: Self-pay | Admitting: Internal Medicine

## 2013-08-12 ENCOUNTER — Ambulatory Visit (INDEPENDENT_AMBULATORY_CARE_PROVIDER_SITE_OTHER): Payer: Medicare Other | Admitting: Cardiovascular Disease

## 2013-08-12 ENCOUNTER — Encounter: Payer: Self-pay | Admitting: Cardiovascular Disease

## 2013-08-12 VITALS — BP 114/62 | HR 90 | Ht 68.5 in | Wt 214.1 lb

## 2013-08-12 DIAGNOSIS — I2510 Atherosclerotic heart disease of native coronary artery without angina pectoris: Secondary | ICD-10-CM

## 2013-08-12 NOTE — Patient Instructions (Signed)
Your physician recommends that you continue on your current medications as directed. Please refer to the Current Medication list given to you today.  Your physician wants you to follow-up in: 6 months with Dr. Nahser.  You will receive a reminder letter in the mail two months in advance. If you don't receive a letter, please call our office to schedule the follow-up appointment.  

## 2013-08-12 NOTE — Assessment & Plan Note (Signed)
She continues to do very well. She's off Plavix. She thinks that her bleeding may have eased a little bit.  She remained stable and does not have any significant episodes of angina.  She may need to have some minor surgery - she is at low risk for her  Surgery.    Her coumadin is managed by [facility].

## 2013-08-12 NOTE — Progress Notes (Signed)
Joyce Terry Date of Birth  October 16, 1935       [facility] [address], [address], [place], [place]  [address]   [place], [place]  [address] [phone]     [phone]   Fax  [phone]    Fax [phone]  Problem List: 1. Coronary artery disease- s/p PCI of LAD 11/12 2. Status post AV node ablation-Pacemaker 3. Mild congestive heart failure 4. Anemia 5. Atrial fibrillation  6. Hyperlipidemia 7. Diabetes mellitus 8  anticoagullation - managed by [facility] - Reynaldo Minium.  History of Present Illness: Joyce Terry is a 78 year old female with a history of coronary artery disease and atrial fibrillation. She is status post  PTCA and stenting of her mid LAD in November 2012.   She also has a history of a GI bleed with a recent hemoglobin of 8.4. She had her pacer generator changed 2 weeks after her angioplasty/stent. She has not noticed any dark or tarry stools.   She has noticed worsening dyspnea recently  Dec. 16, 2013: [name] to have problems with exertional chest pain and shortness breath. She had a stress Myoview study which revealed an apical defect that I thought was due to apical thinning. Her SDS was 4.  She's been having more and more episodes of exertional chest pain. She had to take nitroglycerin last night. The pains are very similar to her previous episodes of angina last year but she states that it is not as severe yet.  She thinks that the pain is worse with cold weather.  April 16, 2012:  She was hospitalized in December for episodes of chest pain. The Myoview study had revealed a fixed defect in the anterior apical wall.  She was found to have a tight stenosis in her first diagonal artery at the origin. This area was "jailed" by her LAD stent. Dr. Julianne Handler performed balloon angioplasty to the ostium of the LAD through the stent struts. He was able to reduce the stenosis from 90 to about 30%. Her symptoms  improved slightly.  July 16, 2012:  Pt is doing well from a cardiac standpoint.  She is having some rectal bleeding .  We had noticed some abdominal tenderness and swelling.  CT of abdomin was normal.   her aspirin has been stopped.   Nov. 18, 2014:   Joyce Terry is doing OK.    Still having rectal bleeding - she thinks its hemerroids  Aug 09, 2013:  She has been having anemia.  We DC'd the plavix at her last visit ( and continued coumadin)  .  She has continued to have rectal bleeding from a hemorrhoid.    No significant CP,  Has occasional dyspnea.      Current Outpatient Prescriptions on File Prior to Visit  Medication Sig Dispense Refill  . calcium-vitamin D (OSCAL WITH D) 500-200 MG-UNIT per tablet Take 1 tablet by mouth 2 (two) times daily.      . carvedilol (COREG) 3.125 MG tablet Take 3.125 mg by mouth 2 (two) times daily with a meal.      . escitalopram (LEXAPRO) 10 MG tablet Take 10 mg by mouth daily.        Marland Kitchen ezetimibe-simvastatin (VYTORIN) 10-20 MG per tablet Take 1 tablet by mouth at bedtime.       . fentaNYL (DURAGESIC - DOSED MCG/HR) 50 MCG/HR Place 1 patch onto the skin every 3 (three) days.      Marland Kitchen  Fluticasone-Salmeterol (ADVAIR) 250-50 MCG/DOSE AEPB Inhale 1 puff into the lungs every 12 (twelve) hours as needed. For shortness of breath       . glyBURIDE-metformin (GLUCOVANCE) 5-500 MG per tablet Take 2 tablets by mouth 2 (two) times daily with a meal.      . insulin aspart (NOVOLOG) 100 UNIT/ML injection Inject 4-8 Units into the skin 2 (two) times daily. If blood sugar>250, use 7-8 units. If if <200, use 4-5 units       . insulin glargine (LANTUS) 100 UNIT/ML injection Inject 0.15 mLs (15 Units total) into the skin at bedtime.  10 mL  11  . losartan (COZAAR) 25 MG tablet Take 1 tablet (25 mg total) by mouth daily.  30 tablet  6  . ondansetron (ZOFRAN-ODT) 4 MG disintegrating tablet Take 4 mg by mouth every 4 (four) hours as needed for nausea.       Marland Kitchen OVER THE COUNTER  MEDICATION Place 1-2 drops into both eyes daily as needed. For dry/itchy eyes --saline drops       . oxyCODONE-acetaminophen (PERCOCET) 10-325 MG per tablet Take 1 tablet by mouth every 4 (four) hours as needed. For pain       . pantoprazole (PROTONIX) 40 MG tablet 40 mg 2 (two) times daily.       Marland Kitchen torsemide (DEMADEX) 20 MG tablet Take 1 tablet (20 mg total) by mouth daily.  30 tablet  5  . warfarin (COUMADIN) 5 MG tablet Take 2.5-5 mg by mouth daily. Take 2.5 mg Mon,Wed and Fri and all other days take 5 mg      . zolpidem (AMBIEN) 10 MG tablet Take 5 mg by mouth at bedtime as needed. For sleep      . nitroGLYCERIN (NITROSTAT) 0.4 MG SL tablet Place 1 tablet (0.4 mg total) under the tongue every 5 (five) minutes as needed for chest pain.  25 tablet  3   No current facility-administered medications on file prior to visit.    Allergies  Allergen Reactions  . Demerol Nausea And Vomiting  . Penicillins Hives  . Tape Other (See Comments)    "surgical tape tears skin up; paper tape is ok"  . Morphine And Related Itching  . Codeine Nausea And Vomiting    Past Medical History  Diagnosis Date  . CHF (congestive heart failure)     chronic systolic CHF  . Hypertension   . Atrial fibrillation   . Obesity   . Coronary artery disease     cardiac catheterization in 1997 -- Est. EF of 50%  . Fatigue   . Stress   . Chest pain   . Peptic ulcer 1985  . Angina   . GERD (gastroesophageal reflux disease)   . Depression   . Sinoatrial node dysfunction   . Gastric polyps   . Colon polyps 05/19/2009    Tubular Adenomous polyps  . Diverticulosis   . Endometrial cancer 1995  . Hypercholesteremia   . DVT (deep venous thrombosis) 1960's    ?RLE (03/16/2012)  . Pneumonia 1991  . Exertional dyspnea     "sometimes" (03/16/2012)  . Type II diabetes mellitus   . Anemia     "iron transfusions ?twice" (03/16/2012)  . Internal hemorrhoid, bleeding     "sometimes" (03/16/2012)  . Migraines      "when I was going thru menopause; none lately" (03/16/2012)  . Arthritis     "qwhere" (03/16/2012)  . Chronic back pain     Past  Surgical History  Procedure Laterality Date  . Vesicovaginal fistula closure w/ tah  1995  . Joint replacement      LEFT HIP   02/2008  . Cholecystectomy  fall 2001  . Cardiac catheterization  02/21/96; 03/16/2012    Est. EF of 50%; "just to look" (03/16/2012)  . Coronary angioplasty with stent placement  02/15/11    "one"  . Coronary angioplasty  03/16/2012    "couldn't put stent in cause of where it is" (03/16/2012)  . Tonsillectomy  ~ 1962  . Abdominal hysterectomy  1995    "endometrial cancer" (03/16/2012)  . Dilation and curettage of uterus  1950's    "had probably 5; several miscarriages when I was younger"  . Total hip arthroplasty  02/2008    "left" (03/16/2012)  . Insert / replace / remove pacemaker  07/23/99    atrial insertion of the AV node -- Medtronic single chamber pacemaker   . Insert / replace / remove pacemaker  2012    "replaced" (03/16/2012)  . Ptca  02/2012    Balloon angioplast of jailed diagonal branch by prior LAD stent    History  Smoking status  . Former Smoker -- 1.00 packs/day for 35 years  . Types: Cigarettes  Smokeless tobacco  . Never Used    Comment: quit smoking "11/1989"    History  Alcohol Use  . Yes    Comment: "glass of wine q once in awhile"    Family History  Problem Relation Age of Onset  . Heart disease Mother   . Hypertension Mother     Reviw of Systems:  Reviewed in the HPI.  All other systems are negative.  Physical Exam: Blood pressure 114/62, pulse 90, height 5' 8.5" (1.74 m), weight 214 lb 1.9 oz (97.124 kg). General: Well developed, well nourished, in no acute distress.  Head: Normocephalic, atraumatic, sclera non-icteric, mucus membranes are moist,   Neck: Supple. Carotids are 2 + without bruits. No JVD  Lungs: Clear bilaterally to auscultation.  Heart: regular rate.  normal   S1 S2. No murmurs, gallops or rubs.   Abdomen: nontender, the mass noticed at the previous visit has resolve.d  Msk:  Strength and tone are normal  Extremities: No clubbing or cyanosis. No edema.  Distal pedal pulses are 2+ and equal bilaterally.  Neuro: Alert and oriented X 3. Moves all extremities spontaneously.  Psych:  Responds to questions appropriately with a normal affect.  ECG: Nov. 18, 2014:  V pacig at 72.   Assessment / Plan:

## 2013-09-04 ENCOUNTER — Other Ambulatory Visit: Payer: Self-pay

## 2013-09-04 MED ORDER — TORSEMIDE 20 MG PO TABS: 20.0000 mg | ORAL_TABLET | Freq: Every day | ORAL | Status: DC

## 2013-09-09 ENCOUNTER — Telehealth: Payer: Self-pay | Admitting: Cardiology

## 2013-09-09 ENCOUNTER — Encounter: Payer: Medicare Other | Admitting: *Deleted

## 2013-09-09 NOTE — Telephone Encounter (Signed)
Spoke with pt and reminded pt of remote transmission that is due today. Pt verbalized understanding.   

## 2013-09-10 ENCOUNTER — Encounter: Payer: Self-pay | Admitting: Cardiology

## 2013-09-17 ENCOUNTER — Telehealth: Payer: Self-pay | Admitting: Internal Medicine

## 2013-09-17 NOTE — Telephone Encounter (Signed)
LMOVM for pt to return call Wednesday 09-18-13 after 8AM.

## 2013-09-17 NOTE — Telephone Encounter (Signed)
New message      Did we get the remote device report?

## 2013-09-18 NOTE — Telephone Encounter (Signed)
Attempt #2. LMOVM for pt to return call.

## 2013-09-20 NOTE — Telephone Encounter (Signed)
3rd attempt. LMOVM to inform pt that we did not receive transmission on 09-09-13

## 2013-09-24 ENCOUNTER — Encounter: Payer: Self-pay | Admitting: Cardiovascular Disease

## 2013-10-01 ENCOUNTER — Other Ambulatory Visit (HOSPITAL_COMMUNITY): Payer: Self-pay | Admitting: Internal Medicine

## 2013-10-02 ENCOUNTER — Other Ambulatory Visit: Payer: Self-pay | Admitting: *Deleted

## 2013-10-02 MED ORDER — CARVEDILOL 3.125 MG PO TABS: 3.1250 mg | ORAL_TABLET | Freq: Two times a day (BID) | ORAL | Status: DC

## 2013-10-03 ENCOUNTER — Encounter (HOSPITAL_COMMUNITY)
Admission: RE | Admit: 2013-10-03 | Discharge: 2013-10-03 | Disposition: A | Discharge status: Home / Self Care | Payer: Medicare Other | Source: Ambulatory Visit | Attending: Internal Medicine | Admitting: Internal Medicine

## 2013-10-03 ENCOUNTER — Encounter (HOSPITAL_COMMUNITY): Payer: Self-pay

## 2013-10-03 DIAGNOSIS — D649 Anemia, unspecified: Secondary | ICD-10-CM | POA: Diagnosis present

## 2013-10-03 MED ORDER — SODIUM CHLORIDE 0.9 % IV SOLN
1020.0000 mg | Freq: Once | INTRAVENOUS | Status: AC
  Administered 2013-10-03: 1020 mg via INTRAVENOUS
  Filled 2013-10-03: qty 34

## 2013-10-03 MED ORDER — SODIUM CHLORIDE 0.9 % IV SOLN
INTRAVENOUS | Status: AC
  Administered 2013-10-03: 250 mL via INTRAVENOUS

## 2013-10-03 NOTE — Discharge Instructions (Signed)
°  FERAHEME °Ferumoxytol injection °What is this medicine? °FERUMOXYTOL is an iron complex. Iron is used to make healthy red blood cells, which carry oxygen and nutrients throughout the body. This medicine is used to treat iron deficiency anemia in people with chronic kidney disease. °This medicine may be used for other purposes; ask your health care provider or pharmacist if you have questions. °COMMON BRAND NAME(S): Feraheme °What should I tell my health care provider before I take this medicine? °They need to know if you have any of these conditions: °-anemia not caused by low iron levels °-high levels of iron in the blood °-magnetic resonance imaging (MRI) test scheduled °-an unusual or allergic reaction to iron, other medicines, foods, dyes, or preservatives °-pregnant or trying to get pregnant °-breast-feeding °How should I use this medicine? °This medicine is for injection into a vein. It is given by a health care professional in a hospital or clinic setting. °Talk to your pediatrician regarding the use of this medicine in children. Special care may be needed. °Overdosage: If you think you've taken too much of this medicine contact a poison control center or emergency room at once. °Overdosage: If you think you have taken too much of this medicine contact a poison control center or emergency room at once. °NOTE: This medicine is only for you. Do not share this medicine with others. °What if I miss a dose? °It is important not to miss your dose. Call your doctor or health care professional if you are unable to keep an appointment. °What may interact with this medicine? °This medicine may interact with the following medications: °-other iron products °This list may not describe all possible interactions. Give your health care provider a list of all the medicines, herbs, non-prescription drugs, or dietary supplements you use. Also tell them if you smoke, drink alcohol, or use illegal drugs. Some items may  interact with your medicine. °What should I watch for while using this medicine? °Visit your doctor or healthcare professional regularly. Tell your doctor or healthcare professional if your symptoms do not start to get better or if they get worse. You may need blood work done while you are taking this medicine. °You may need to follow a special diet. Talk to your doctor. Foods that contain iron include: whole grains/cereals, dried fruits, beans, or peas, leafy green vegetables, and organ meats (liver, kidney). °What side effects may I notice from receiving this medicine? °Side effects that you should report to your doctor or health care professional as soon as possible: °-allergic reactions like skin rash, itching or hives, swelling of the face, lips, or tongue °-breathing problems °-changes in blood pressure °-feeling faint or lightheaded, falls °-fever or chills °-flushing, sweating, or hot feelings °-swelling of the ankles or feet °Side effects that usually do not require medical attention (Report these to your doctor or health care professional if they continue or are bothersome.): °-diarrhea °-headache °-nausea, vomiting °-stomach pain °This list may not describe all possible side effects. Call your doctor for medical advice about side effects. You may report side effects to FDA at 1-800-FDA-1088. °Where should I keep my medicine? °This drug is given in a hospital or clinic and will not be stored at home. °NOTE: This sheet is a summary. It may not cover all possible information. If you have questions about this medicine, talk to your doctor, pharmacist, or health care provider. °© 2015, Elsevier/Gold Standard. (2011-10-28 15:23:36) ° °

## 2013-10-30 ENCOUNTER — Other Ambulatory Visit: Payer: Self-pay

## 2013-10-30 MED ORDER — LOSARTAN POTASSIUM 25 MG PO TABS: 25.0000 mg | ORAL_TABLET | Freq: Every day | ORAL | Status: DC

## 2014-01-30 ENCOUNTER — Other Ambulatory Visit: Payer: Self-pay

## 2014-01-30 MED ORDER — CARVEDILOL 3.125 MG PO TABS: 3.1250 mg | ORAL_TABLET | Freq: Two times a day (BID) | ORAL | Status: DC

## 2014-02-11 ENCOUNTER — Ambulatory Visit (INDEPENDENT_AMBULATORY_CARE_PROVIDER_SITE_OTHER): Payer: Medicare Other | Admitting: Cardiovascular Disease

## 2014-02-11 ENCOUNTER — Encounter: Payer: Self-pay | Admitting: Cardiovascular Disease

## 2014-02-11 VITALS — BP 126/80 | HR 72 | Ht 68.0 in | Wt 214.0 lb

## 2014-02-11 DIAGNOSIS — I5022 Chronic systolic (congestive) heart failure: Secondary | ICD-10-CM

## 2014-02-11 DIAGNOSIS — I482 Chronic atrial fibrillation, unspecified: Secondary | ICD-10-CM

## 2014-02-11 DIAGNOSIS — I2510 Atherosclerotic heart disease of native coronary artery without angina pectoris: Secondary | ICD-10-CM

## 2014-02-11 NOTE — Assessment & Plan Note (Signed)
Joyce Terry  has been having some mild atypical chest pains that do not sound like a typical ischemia. She'll try taking some antacids were some Motrin to see for chest pain

## 2014-02-11 NOTE — Patient Instructions (Signed)
Your physician recommends that you continue on your current medications as directed. Please refer to the Current Medication list given to you today.  Your physician wants you to follow-up in: 6 months with Dr. Nahser.  You will receive a reminder letter in the mail two months in advance. If you don't receive a letter, please call our office to schedule the follow-up appointment.  

## 2014-02-11 NOTE — Assessment & Plan Note (Signed)
Joyce Terry  seems to be doing well. She has chronic systolic congestive heart failure. Continue current medications.

## 2014-02-11 NOTE — Assessment & Plan Note (Signed)
Joyce Terry has  chronic atrial fibrillation. She has a pacemaker placement. Continue with any coagulation. Rate is well controlled.

## 2014-02-11 NOTE — Progress Notes (Signed)
Joyce Terry Date of Birth  11/08/1935       [facility] [address], [place], [place]  [address]   [place], [place]  [address] [phone]     [phone]   Fax  [phone]    Fax [phone]  Problem List: 1. Coronary artery disease- s/p PCI of LAD 11/12 2. Status post AV node ablation-Pacemaker 3. Mild congestive heart failure 4. Anemia 5. Atrial fibrillation  6. Hyperlipidemia 7. Diabetes mellitus 8  anticoagullation - managed by [facility] - Joyce Terry.  History of Present Illness: Joyce Terry is a 78 year old female with a history of coronary artery disease and atrial fibrillation. She is status post  PTCA and stenting of her mid LAD in November 2012.   She also has a history of a GI bleed with a recent hemoglobin of 8.4. She had her pacer generator changed 2 weeks after her angioplasty/stent. She has not noticed any dark or tarry stools.   She has noticed worsening dyspnea recently  Dec. 16, 2013: Joyce Terry he continues to have problems with exertional chest pain and shortness breath. She had a stress Myoview study which revealed an apical defect that I thought was due to apical thinning. Her SDS was 4.  She's been having more and more episodes of exertional chest pain. She had to take nitroglycerin last night. The pains are very similar to her previous episodes of angina last year but she states that it is not as severe yet.  She thinks that the pain is worse with cold weather.  April 16, 2012:  She was hospitalized in December for episodes of chest pain. The Myoview study had revealed a fixed defect in the anterior apical wall.  She was found to have a tight stenosis in her first diagonal artery at the origin. This area was "jailed" by her LAD stent. Dr. Julianne Handler performed balloon angioplasty to the ostium of the LAD through the stent struts. He was able to reduce the stenosis from 90 to about 30%. Her symptoms  improved slightly.  July 16, 2012:  Pt is doing well from a cardiac standpoint.  She is having some rectal bleeding .  We had noticed some abdominal tenderness and swelling.  CT of abdomin was normal.   her aspirin has been stopped.   Nov. 18, 2014:   Joyce Terry is doing OK.    Still having rectal bleeding - she thinks its hemerroids  Aug 09, 2013:  She has been having anemia.  We DC'd the plavix at her last visit ( and continued coumadin)  .  She has continued to have rectal bleeding from a hemorrhoid.    No significant CP,  Has occasional dyspnea.    Nov. 17, 2015:  Joyce Terry is doing well.  No feeling well. Has occasional CP  - not severe , does not think she needs a stress test.  Is willing to try antiacids     Current Outpatient Prescriptions on File Prior to Visit  Medication Sig Dispense Refill  . calcium-vitamin D (OSCAL WITH D) 500-200 MG-UNIT per tablet Take 1 tablet by mouth 2 (two) times daily.    . carvedilol (COREG) 3.125 MG tablet Take 1 tablet (3.125 mg total) by mouth 2 (two) times daily with a meal. 60 tablet 3  . escitalopram (LEXAPRO) 10 MG tablet Take 10 mg by mouth daily.      Marland Kitchen ezetimibe-simvastatin (VYTORIN) 10-20 MG per  tablet Take 1 tablet by mouth at bedtime.     . fentaNYL (DURAGESIC - DOSED MCG/HR) 50 MCG/HR Place 1 patch onto the skin every 3 (three) days.    . ferrous sulfate 325 (65 FE) MG tablet Take 325 mg by mouth 2 (two) times daily with a meal.    . Fluticasone-Salmeterol (ADVAIR) 250-50 MCG/DOSE AEPB Inhale 1 puff into the lungs every 12 (twelve) hours as needed. For shortness of breath     . glyBURIDE-metformin (GLUCOVANCE) 5-500 MG per tablet Take 2 tablets by mouth 2 (two) times daily with a meal.    . insulin aspart (NOVOLOG) 100 UNIT/ML injection Inject 4-8 Units into the skin 2 (two) times daily. If blood sugar>250, use 7-8 units. If if <200, use 4-5 units     . insulin glargine (LANTUS) 100 UNIT/ML injection Inject 0.15 mLs (15 Units total)  into the skin at bedtime. 10 mL 11  . losartan (COZAAR) 25 MG tablet Take 1 tablet (25 mg total) by mouth daily. 30 tablet 6  . ondansetron (ZOFRAN-ODT) 4 MG disintegrating tablet Take 4 mg by mouth every 4 (four) hours as needed for nausea.     Marland Kitchen OVER THE COUNTER MEDICATION Place 1-2 drops into both eyes daily as needed. For dry/itchy eyes --saline drops     . oxyCODONE-acetaminophen (PERCOCET) 10-325 MG per tablet Take 1 tablet by mouth every 4 (four) hours as needed. For pain     . pantoprazole (PROTONIX) 40 MG tablet 40 mg 2 (two) times daily.     Marland Kitchen torsemide (DEMADEX) 20 MG tablet Take 1 tablet (20 mg total) by mouth daily. 30 tablet 5  . warfarin (COUMADIN) 5 MG tablet Take 2.5-5 mg by mouth daily. Take 2.5 mg Mon,Wed and Fri and all other days take 5 mg    . zolpidem (AMBIEN) 10 MG tablet Take 5 mg by mouth at bedtime as needed. For sleep    . nitroGLYCERIN (NITROSTAT) 0.4 MG SL tablet Place 1 tablet (0.4 mg total) under the tongue every 5 (five) minutes as needed for chest pain. 25 tablet 3   No current facility-administered medications on file prior to visit.    Allergies  Allergen Reactions  . Demerol Nausea And Vomiting  . Penicillins Hives  . Tape Other (See Comments)    "surgical tape tears skin up; paper tape is ok"  . Morphine And Related Itching  . Codeine Nausea And Vomiting    Past Medical History  Diagnosis Date  . CHF (congestive heart failure)     chronic systolic CHF  . Hypertension   . Atrial fibrillation   . Obesity   . Coronary artery disease     cardiac catheterization in 1997 -- Est. EF of 50%  . Fatigue   . Stress   . Chest pain   . Peptic ulcer 1985  . Angina   . GERD (gastroesophageal reflux disease)   . Depression   . Sinoatrial node dysfunction   . Gastric polyps   . Colon polyps 05/19/2009    Tubular Adenomous polyps  . Diverticulosis   . Endometrial cancer 1995  . Hypercholesteremia   . DVT (deep venous thrombosis) 1960's    ?RLE  (03/16/2012)  . Pneumonia 1991  . Exertional dyspnea     "sometimes" (03/16/2012)  . Type II diabetes mellitus   . Anemia     "iron transfusions ?twice" (03/16/2012)  . Internal hemorrhoid, bleeding     "sometimes" (03/16/2012)  . Migraines     "  when I was going thru menopause; none lately" (03/16/2012)  . Arthritis     "qwhere" (03/16/2012)  . Chronic back pain     Past Surgical History  Procedure Laterality Date  . Vesicovaginal fistula closure w/ tah  1995  . Joint replacement      LEFT HIP   02/2008  . Cholecystectomy  fall 2001  . Cardiac catheterization  02/21/96; 03/16/2012    Est. EF of 50%; "just to look" (03/16/2012)  . Coronary angioplasty with stent placement  02/15/11    "one"  . Coronary angioplasty  03/16/2012    "couldn't put stent in cause of where it is" (03/16/2012)  . Tonsillectomy  ~ 1962  . Abdominal hysterectomy  1995    "endometrial cancer" (03/16/2012)  . Dilation and curettage of uterus  1950's    "had probably 5; several miscarriages when I was younger"  . Total hip arthroplasty  02/2008    "left" (03/16/2012)  . Insert / replace / remove pacemaker  07/23/99    atrial insertion of the AV node -- Medtronic single chamber pacemaker   . Insert / replace / remove pacemaker  2012    "replaced" (03/16/2012)  . Ptca  02/2012    Balloon angioplast of jailed diagonal branch by prior LAD stent    History  Smoking status  . Former Smoker -- 1.00 packs/day for 35 years  . Types: Cigarettes  Smokeless tobacco  . Never Used    Comment: quit smoking "11/1989"    History  Alcohol Use  . Yes    Comment: "glass of wine q once in awhile"    Family History  Problem Relation Age of Onset  . Heart disease Mother   . Hypertension Mother     Reviw of Systems:  Reviewed in the HPI.  All other systems are negative.  Physical Exam: Blood pressure 126/80, pulse 72, height 5\' 8"  (1.727 m), weight 214 lb (97.07 kg). General: Well developed, well  nourished, in no acute distress.  Head: Normocephalic, atraumatic, sclera non-icteric, mucus membranes are moist,   Neck: Supple. Carotids are 2 + without bruits. No JVD  Lungs: Clear bilaterally to auscultation.  Heart: regular rate.  normal  S1 S2. No murmurs, gallops or rubs.   Abdomen: nontender, the mass noticed at the previous visit has resolve.d  Msk:  Strength and tone are normal  Extremities: No clubbing or cyanosis. No edema.  Distal pedal pulses are 2+ and equal bilaterally.  Neuro: Alert and oriented X 3. Moves all extremities spontaneously.  Psych:  Responds to questions appropriately with a normal affect.  ECG: Nov. 17, 2015:  Ventricular pacing.  Assessment / Plan:

## 2014-02-25 ENCOUNTER — Other Ambulatory Visit: Payer: Self-pay | Admitting: *Deleted

## 2014-02-25 MED ORDER — TORSEMIDE 20 MG PO TABS: 20.0000 mg | ORAL_TABLET | Freq: Every day | ORAL | Status: DC

## 2014-03-05 ENCOUNTER — Encounter (HOSPITAL_COMMUNITY): Payer: Self-pay | Admitting: Cardiovascular Disease

## 2014-03-06 ENCOUNTER — Encounter (HOSPITAL_COMMUNITY): Payer: Self-pay | Admitting: Cardiovascular Disease

## 2014-04-09 ENCOUNTER — Encounter: Payer: Self-pay | Admitting: Internal Medicine

## 2014-04-09 ENCOUNTER — Ambulatory Visit (INDEPENDENT_AMBULATORY_CARE_PROVIDER_SITE_OTHER): Payer: Medicare Other | Admitting: Internal Medicine

## 2014-04-09 VITALS — BP 132/64 | HR 74 | Ht 68.5 in | Wt 210.8 lb

## 2014-04-09 DIAGNOSIS — I5032 Chronic diastolic (congestive) heart failure: Secondary | ICD-10-CM

## 2014-04-09 DIAGNOSIS — I495 Sick sinus syndrome: Secondary | ICD-10-CM

## 2014-04-09 DIAGNOSIS — I951 Orthostatic hypotension: Secondary | ICD-10-CM

## 2014-04-09 DIAGNOSIS — I482 Chronic atrial fibrillation, unspecified: Secondary | ICD-10-CM

## 2014-04-09 DIAGNOSIS — Z950 Presence of cardiac pacemaker: Secondary | ICD-10-CM

## 2014-04-09 LAB — MDC_IDC_ENUM_SESS_TYPE_INCLINIC
Battery Remaining Longevity: 81 mo
Lead Channel Impedance Value: 591 Ohm
Lead Channel Pacing Threshold Amplitude: 0.5 V
Lead Channel Sensing Intrinsic Amplitude: 11.2 mV
Lead Channel Setting Pacing Amplitude: 2.5 V
Lead Channel Setting Pacing Pulse Width: 0.4 ms
Lead Channel Setting Sensing Sensitivity: 2.8 mV
MDC IDC MSMT BATTERY IMPEDANCE: 450 Ohm
MDC IDC MSMT BATTERY VOLTAGE: 2.78 V
MDC IDC MSMT LEADCHNL RA IMPEDANCE VALUE: 0 Ohm
MDC IDC MSMT LEADCHNL RV PACING THRESHOLD PULSEWIDTH: 0.4 ms
MDC IDC SESS DTM: 20160113152412
MDC IDC STAT BRADY RV PERCENT PACED: 100 %

## 2014-04-09 MED ORDER — TORSEMIDE 20 MG PO TABS: ORAL_TABLET | ORAL | Status: DC

## 2014-04-09 NOTE — Assessment & Plan Note (Signed)
Her medtronic PM is working normally. Will recheck in several months. 

## 2014-04-09 NOTE — Patient Instructions (Addendum)
Your physician has recommended you make the following change in your medication:  1) HOLD Torsemide for 2 days; Start back on Saturday, Jan. 16th taking a half a tablet a day.  My take a whole table if having swelling in hands an feet and/or shortness of breath  Your physician wants you to follow-up in: 1 year with Dr. Lovena Le. You will receive a reminder letter in the mail two months in advance. If you don't receive a letter, please call our office to schedule the follow-up appointment.  Remote monitoring is used to monitor your Pacemaker or ICD from home. This monitoring reduces the number of office visits required to check your device to one time per year. It allows [place] to keep an eye on the functioning of your device to ensure it is working properly. You are scheduled for a device check from home on 07/09/2014. You may send your transmission at any time that day. If you have a wireless device, the transmission will be sent automatically. After your physician reviews your transmission, you will receive a postcard with your next transmission date.

## 2014-04-09 NOTE — Assessment & Plan Note (Addendum)
Her chronic diastolic heart failure is well compensated. If anything she is over diuresed and I have asked her to reduce her diuretic.

## 2014-04-09 NOTE — Progress Notes (Signed)
HPI Joyce Terry returns today for followup. She is a 79 year old woman with known coronary disease status post catheterizations and stents, symptomatic bradycardia with complete heart block, status post permanent pacemaker insertion, and a h/o GI bleeding.  She has not had chest pain or sob . No syncope. Her main complaint today is related to dizziness when standing. No peripheral edema. She has previously had problems with lower extremity swelling.  Allergies  Allergen Reactions  . Demerol Nausea And Vomiting  . Penicillins Hives  . Tape Other (See Comments)    "surgical tape tears skin up; paper tape is ok"  . Morphine And Related Itching  . Codeine Nausea And Vomiting     Current Outpatient Prescriptions  Medication Sig Dispense Refill  . B-D UF III MINI PEN NEEDLES 31G X 5 MM MISC   0  . calcium-vitamin D (OSCAL WITH D) 500-200 MG-UNIT per tablet Take 1 tablet by mouth 2 (two) times daily.    . carvedilol (COREG) 3.125 MG tablet Take 1 tablet (3.125 mg total) by mouth 2 (two) times daily with a meal. 60 tablet 3  . escitalopram (LEXAPRO) 10 MG tablet Take 10 mg by mouth daily.      Marland Kitchen ezetimibe-simvastatin (VYTORIN) 10-20 MG per tablet Take 1 tablet by mouth at bedtime.     . fentaNYL (DURAGESIC - DOSED MCG/HR) 50 MCG/HR Place 1 patch onto the skin every 3 (three) days.    . Fluticasone-Salmeterol (ADVAIR) 250-50 MCG/DOSE AEPB Inhale 1 puff into the lungs every 12 (twelve) hours as needed. For shortness of breath     . glyBURIDE-metformin (GLUCOVANCE) 5-500 MG per tablet Take 2 tablets by mouth 2 (two) times daily with a meal. (Patient taking differently: Take 1 tablet by mouth 2 (two) times daily with a meal. )    . insulin aspart (NOVOLOG) 100 UNIT/ML injection Inject 4-8 Units into the skin 2 (two) times daily. If blood sugar>250, use 7-8 units. If if <200, use 4-5 units     . insulin glargine (LANTUS) 100 UNIT/ML injection Inject 0.15 mLs (15 Units total) into the skin at bedtime.  (Patient taking differently: Inject into the skin at bedtime. 20/25 unit injection in the skin at bedtime.Marland Kitchen) 10 mL 11  . losartan (COZAAR) 25 MG tablet Take 1 tablet (25 mg total) by mouth daily. 30 tablet 6  . meclizine (ANTIVERT) 25 MG tablet Take 12.5 mg by mouth daily as needed. Take every 6 hours by mouth as needed for dizziness.    Marland Kitchen OVER THE COUNTER MEDICATION Place 1-2 drops into both eyes daily as needed. For dry/itchy eyes --saline drops     . oxyCODONE-acetaminophen (PERCOCET) 10-325 MG per tablet Take 1 tablet by mouth every 4 (four) hours as needed. For pain     . pantoprazole (PROTONIX) 40 MG tablet 40 mg 2 (two) times daily.     Marland Kitchen torsemide (DEMADEX) 20 MG tablet Take 1 tablet (20 mg total) by mouth daily. 30 tablet 5  . warfarin (COUMADIN) 5 MG tablet Take 2.5-5 mg by mouth daily. Take 2.5 mg Mon,Wed and Fri and all other days take 5 mg    . zolpidem (AMBIEN) 10 MG tablet Take 5 mg by mouth at bedtime as needed. For sleep    . ferrous sulfate 325 (65 FE) MG tablet Take 325 mg by mouth 2 (two) times daily with a meal.    . nitroGLYCERIN (NITROSTAT) 0.4 MG SL tablet Place 1 tablet (0.4 mg total) under the tongue  every 5 (five) minutes as needed for chest pain. 25 tablet 3   No current facility-administered medications for this visit.     Past Medical History  Diagnosis Date  . CHF (congestive heart failure)     chronic systolic CHF  . Hypertension   . Atrial fibrillation   . Obesity   . Coronary artery disease     cardiac catheterization in 1997 -- Est. EF of 50%  . Fatigue   . Stress   . Chest pain   . Peptic ulcer 1985  . Angina   . GERD (gastroesophageal reflux disease)   . Depression   . Sinoatrial node dysfunction   . Gastric polyps   . Colon polyps 05/19/2009    Tubular Adenomous polyps  . Diverticulosis   . Endometrial cancer 1995  . Hypercholesteremia   . DVT (deep venous thrombosis) 1960's    ?RLE (03/16/2012)  . Pneumonia 1991  . Exertional dyspnea      "sometimes" (03/16/2012)  . Type II diabetes mellitus   . Anemia     "iron transfusions ?twice" (03/16/2012)  . Internal hemorrhoid, bleeding     "sometimes" (03/16/2012)  . Migraines     "when I was going thru menopause; none lately" (03/16/2012)  . Arthritis     "qwhere" (03/16/2012)  . Chronic back pain     ROS:   All systems reviewed and negative except as noted in the HPI.   Past Surgical History  Procedure Laterality Date  . Vesicovaginal fistula closure w/ tah  1995  . Joint replacement      LEFT HIP   02/2008  . Cholecystectomy  fall 2001  . Cardiac catheterization  02/21/96; 03/16/2012    Est. EF of 50%; "just to look" (03/16/2012)  . Coronary angioplasty with stent placement  02/15/11    "one"  . Coronary angioplasty  03/16/2012    "couldn't put stent in cause of where it is" (03/16/2012)  . Tonsillectomy  ~ 1962  . Abdominal hysterectomy  1995    "endometrial cancer" (03/16/2012)  . Dilation and curettage of uterus  1950's    "had probably 5; several miscarriages when I was younger"  . Total hip arthroplasty  02/2008    "left" (03/16/2012)  . Insert / replace / remove pacemaker  07/23/99    atrial insertion of the AV node -- Medtronic single chamber pacemaker   . Insert / replace / remove pacemaker  2012    "replaced" (03/16/2012)  . Ptca  02/2012    Balloon angioplast of jailed diagonal branch by prior LAD stent  . Percutaneous coronary stent intervention (pci-s) N/A 02/15/2011    Procedure: PERCUTANEOUS CORONARY STENT INTERVENTION (PCI-S);  Surgeon: Burnell Blanks, MD;  Location: [facility] CATH LAB;  Service: Cardiovascular;  Laterality: N/A;  . Pacemaker generator change N/A 03/02/2011    Procedure: PACEMAKER GENERATOR CHANGE;  Surgeon: Evans Lance, MD;  Location[facility] CATH LAB;  Service: Cardiovascular;  Laterality: N/A;  . Left heart catheterization with coronary angiogram N/A 03/16/2012    Procedure: LEFT HEART CATHETERIZATION WITH CORONARY  ANGIOGRAM;  Surgeon: Thayer Headings, MD;  Location: [facility] CATH LAB;  Service: Cardiovascular;  Laterality: N/A;  . Percutaneous coronary stent intervention (pci-s) N/A 03/16/2012    Procedure: PERCUTANEOUS CORONARY STENT INTERVENTION (PCI-S);  Surgeon: Burnell Blanks, MD;  Location: [facility] CATH LAB;  Service: Cardiovascular;  Laterality: N/A;     Family History  Problem Relation Age of Onset  . Heart disease Mother   .  Hypertension Mother      History   Social History  . Marital Status: Divorced    Spouse Name: N/A    Number of Children: N/A  . Years of Education: N/A   Occupational History  . Retired    Social History Main Topics  . Smoking status: Former Smoker -- 1.00 packs/day for 35 years    Types: Cigarettes  . Smokeless tobacco: Never Used     Comment: quit smoking "11/1989"  . Alcohol Use: Yes     Comment: "glass of wine q once in awhile"  . Drug Use: No  . Sexual Activity: No   Other Topics Concern  . Not on file   Social History Narrative     BP 132/64 mmHg  Pulse 74  Ht 5' 8.5" (1.74 m)  Wt 210 lb 12.8 oz (95.618 kg)  BMI 31.58 kg/m2  Physical Exam:  Well appearing 79 year old woman, NAD HEENT: Unremarkable Neck:  No JVD, no thyromegally Lungs:  Clear with no wheezes, rales, or rhonchi. HEART:  Regular rate rhythm, no murmurs, no rubs, no clicks Abd:  soft, positive bowel sounds, no organomegally, no rebound, no guarding Ext:  2 plus pulses, no edema, no cyanosis, no clubbing Skin:  No rashes no nodules Neuro:  CN II through XII intact, motor grossly intact   DEVICE  Normal device function.  See PaceArt for details.   Assess/Plan:

## 2014-04-09 NOTE — Assessment & Plan Note (Signed)
Her ventricular rate is well controlled. She will continue her current meds. 

## 2014-04-09 NOTE — Assessment & Plan Note (Signed)
Her symptoms of dizziness when she stands up are related to her diuretic therapy. I have asked her to hold her Torsemide for 2-3 days and restart at half dose. She may need to stop her diuretic altogether.

## 2014-04-11 ENCOUNTER — Ambulatory Visit: Payer: Medicare Other | Admitting: Internal Medicine

## 2014-04-29 ENCOUNTER — Ambulatory Visit: Payer: Medicare Other | Admitting: Internal Medicine

## 2014-05-18 ENCOUNTER — Other Ambulatory Visit: Payer: Self-pay | Admitting: Internal Medicine

## 2014-05-19 ENCOUNTER — Other Ambulatory Visit: Payer: Self-pay

## 2014-05-19 MED ORDER — CARVEDILOL 3.125 MG PO TABS
3.1250 mg | ORAL_TABLET | Freq: Two times a day (BID) | ORAL | Status: DC
Start: 2014-05-19 — End: 2014-09-15

## 2014-07-09 ENCOUNTER — Encounter: Payer: Medicare Other | Admitting: *Deleted

## 2014-07-10 ENCOUNTER — Telehealth: Payer: Self-pay | Admitting: Cardiology

## 2014-07-10 NOTE — Telephone Encounter (Signed)
LMOVM reminding pt to send remote transmission.   

## 2014-07-11 ENCOUNTER — Encounter: Payer: Self-pay | Admitting: Cardiology

## 2014-08-11 ENCOUNTER — Encounter: Payer: Self-pay | Admitting: Cardiovascular Disease

## 2014-08-11 ENCOUNTER — Ambulatory Visit (INDEPENDENT_AMBULATORY_CARE_PROVIDER_SITE_OTHER): Payer: Medicare Other | Admitting: Cardiovascular Disease

## 2014-08-11 VITALS — BP 136/70 | HR 84 | Ht 68.5 in | Wt 210.8 lb

## 2014-08-11 DIAGNOSIS — I482 Chronic atrial fibrillation, unspecified: Secondary | ICD-10-CM

## 2014-08-11 DIAGNOSIS — I5032 Chronic diastolic (congestive) heart failure: Secondary | ICD-10-CM

## 2014-08-11 DIAGNOSIS — I2510 Atherosclerotic heart disease of native coronary artery without angina pectoris: Secondary | ICD-10-CM

## 2014-08-11 DIAGNOSIS — Z7901 Long term (current) use of anticoagulants: Secondary | ICD-10-CM | POA: Diagnosis not present

## 2014-08-11 NOTE — Patient Instructions (Signed)
Medication Instructions:  Your physician recommends that you continue on your current medications as directed. Please refer to the Current Medication list given to you today.   Labwork: Your Coumadin level (PT/INR) was checked today and it was 3.2  Testing/Procedures: None Ordered  Follow-Up: Your physician wants you to follow-up in: 6 months with Dr. Acie Fredrickson.  You will receive a reminder letter in the mail two months in advance. If you don't receive a letter, please call our office to schedule the follow-up appointment.

## 2014-08-11 NOTE — Progress Notes (Signed)
Cardiology Office Note   Date:  08/11/2014   ID:  Joyce Terry, DOB Feb 04, 1936, MRN [id number]  PCP:  Geoffery Lyons, MD  Cardiologist:   Thayer Headings, MD   Chief Complaint  Patient presents with  . Atrial Fibrillation   1. Coronary artery disease- s/p PCI of LAD 11/12 2. Status post AV node ablation-Pacemaker 3. Mild congestive heart failure 4. Anemia 5. Atrial fibrillation  6. Hyperlipidemia 7. Diabetes mellitus 8 anticoagullation - managed by [facility] - [facility]do [facility]ium.  History of Present Illness: Joyce Terry is a 79 year old female with a history of coronary artery disease and atrial fibrillation. She is status post PTCA and stenting of her mid LAD in November 2012. She also has a history of a GI bleed with a recent hemoglobin of 8.4. She had her pacer generator changed 2 weeks after her angioplasty/stent. She has not noticed any dark or tarry stools.   She has noticed worsening dyspnea recently  Dec. 16, 2013: [name] to have problems with exertional chest pain and shortness breath. She had a stress Myoview study which revealed an apical defect that I thought was due to apical thinning. Her SDS was 4.  She's been having more and more episodes of exertional chest pain. She had to take nitroglycerin last night. The pains are very similar to her previous episodes of angina last year but she states that it is not as severe yet. She thinks that the pain is worse with cold weather.  April 16, 2012:  She was hospitalized in December for episodes of chest pain. The Myoview study had revealed a fixed defect in the anterior apical wall. She was found to have a tight stenosis in her first diagonal artery at the origin. This area was "jailed" by her LAD stent. Dr. Julianne Handler performed balloon angioplasty to the ostium of the LAD through the stent struts. He was able to reduce the stenosis from 90 to about 30%. Her symptoms improved slightly.  July 16, 2012:  Pt is doing  well from a cardiac standpoint. She is having some rectal bleeding . We had noticed some abdominal tenderness and swelling. CT of abdomin was normal.  her aspirin has been stopped.   Nov. 18, 2014:  Joyce Terry is doing OK. Still having rectal bleeding - she thinks its hemerroids  Aug 09, 2013:  She has been having anemia. We DC'd the plavix at her last visit ( and continued coumadin) . She has continued to have rectal bleeding from a hemorrhoid. No significant CP, Has occasional dyspnea.   Nov. 17, 2015:  Joyce Terry is doing well. No feeling well. Has occasional CP - not severe , does not think she needs a stress test.  Is willing to try antiacids    Aug 11, 2014:  Joyce Terry is a 79 y.o. female who presents for follow up of her atrial fib, CAD. She has had lots of hematura - she was started on Cipro and the hematura has worsened since that time . She has not had any cardiac complaints.  Her INR levels are followed by [facility] at [place]    Past Medical History  Diagnosis Date  . CHF (congestive heart failure)     chronic systolic CHF  . Hypertension   . Atrial fibrillation   . Obesity   . Coronary artery disease     cardiac catheterization in 1997 -- Est. EF of 50%  . Fatigue   . Stress   . Chest  pain   . Peptic ulcer 1985  . Angina   . GERD (gastroesophageal reflux disease)   . Depression   . Sinoatrial node dysfunction   . Gastric polyps   . Colon polyps 05/19/2009    Tubular Adenomous polyps  . Diverticulosis   . Endometrial cancer 1995  . Hypercholesteremia   . DVT (deep venous thrombosis) 1960's    ?RLE (03/16/2012)  . Pneumonia 1991  . Exertional dyspnea     "sometimes" (03/16/2012)  . Type II diabetes mellitus   . Anemia     "iron transfusions ?twice" (03/16/2012)  . Internal hemorrhoid, bleeding     "sometimes" (03/16/2012)  . Migraines     "when I was going thru menopause; none lately" (03/16/2012)  . Arthritis      "qwhere" (03/16/2012)  . Chronic back pain     Past Surgical History  Procedure Laterality Date  . Vesicovaginal fistula closure w/ tah  1995  . Joint replacement      LEFT HIP   02/2008  . Cholecystectomy  fall 2001  . Cardiac catheterization  02/21/96; 03/16/2012    Est. EF of 50%; "just to look" (03/16/2012)  . Coronary angioplasty with stent placement  02/15/11    "one"  . Coronary angioplasty  03/16/2012    "couldn't put stent in cause of where it is" (03/16/2012)  . Tonsillectomy  ~ 1962  . Abdominal hysterectomy  1995    "endometrial cancer" (03/16/2012)  . Dilation and curettage of uterus  1950's    "had probably 5; several miscarriages when I was younger"  . Total hip arthroplasty  02/2008    "left" (03/16/2012)  . Insert / replace / remove pacemaker  07/23/99    atrial insertion of the AV node -- Medtronic single chamber pacemaker   . Insert / replace / remove pacemaker  2012    "replaced" (03/16/2012)  . Ptca  02/2012    Balloon angioplast of jailed diagonal branch by prior LAD stent  . Percutaneous coronary stent intervention (pci-s) N/A 02/15/2011    Procedure: PERCUTANEOUS CORONARY STENT INTERVENTION (PCI-S);  Surgeon: Burnell Blanks, MD;  Location: [facility] CATH LAB;  Service: Cardiovascular;  Laterality: N/A;  . Pacemaker generator change N/A 03/02/2011    Procedure: PACEMAKER GENERATOR CHANGE;  Surgeon: Evans Lance, MD;  Location: [facility] CATH LAB;  Service: Cardiovascular;  Laterality: N/A;  . Left heart catheterization with coronary angiogram N/A 03/16/2012    Procedure: LEFT HEART CATHETERIZATION WITH CORONARY ANGIOGRAM;  Surgeon: Thayer Headings, MD;  Location[facility] C[facility] LAB;  Service: Cardiovascular;  Laterality: N/A;  . Percutaneous coronary stent intervention (pci-s) N/A 03/16/2012    Procedure: PERCUTANEOUS CORONARY STENT INTERVENTION (PCI-S);  Surgeon: Burnell Blanks, MD;  Location: [facility] CATH LAB;  Service: Cardiovascular;  Laterality: N/A;      Current Outpatient Prescriptions  Medication Sig Dispense Refill  . B-D UF III MINI PEN NEEDLES 31G X 5 MM MISC   0  . calcium-vitamin D (OSCAL WITH D) 500-200 MG-UNIT per tablet Take 1 tablet by mouth 2 (two) times daily.    . carvedilol (COREG) 3.125 MG tablet Take 1 tablet (3.125 mg total) by mouth 2 (two) times daily with a meal. 60 tablet 3  . escitalopram (LEXAPRO) 10 MG tablet Take 10 mg by mouth daily.      Marland Kitchen ezetimibe-simvastatin (VYTORIN) 10-20 MG per tablet Take 1 tablet by mouth at bedtime.     . fentaNYL (DURAGESIC - DOSED MCG/HR) 50 MCG/HR  Place 1 patch onto the skin every 3 (three) days.    . ferrous sulfate 325 (65 FE) MG tablet Take 325 mg by mouth 2 (two) times daily with a meal.    . Fluticasone-Salmeterol (ADVAIR) 250-50 MCG/DOSE AEPB Inhale 1 puff into the lungs every 12 (twelve) hours as needed. For shortness of breath     . glyBURIDE-metformin (GLUCOVANCE) 5-500 MG per tablet Take 2 tablets by mouth 2 (two) times daily with a meal. (Patient taking differently: Take 1 tablet by mouth 2 (two) times daily with a meal. )    . insulin aspart (NOVOLOG) 100 UNIT/ML injection Inject 4-8 Units into the skin 2 (two) times daily. If blood sugar>250, use 7-8 units. If if <200, use 4-5 units     . insulin glargine (LANTUS) 100 UNIT/ML injection Inject 0.15 mLs (15 Units total) into the skin at bedtime. (Patient taking differently: Inject into the skin at bedtime. 20/25 unit injection in the skin at bedtime.Marland Kitchen) 10 mL 11  . losartan (COZAAR) 25 MG tablet take 1 tablet by mouth once daily 30 tablet 10  . meclizine (ANTIVERT) 25 MG tablet Take 12.5 mg by mouth daily as needed. Take every 6 hours by mouth as needed for dizziness.    . nitroGLYCERIN (NITROSTAT) 0.4 MG SL tablet Place 1 tablet (0.4 mg total) under the tongue every 5 (five) minutes as needed for chest pain. 25 tablet 3  . OVER THE COUNTER MEDICATION Place 1-2 drops into both eyes daily as needed. For dry/itchy eyes --saline  drops     . oxyCODONE-acetaminophen (PERCOCET) 10-325 MG per tablet Take 1 tablet by mouth every 4 (four) hours as needed. For pain     . pantoprazole (PROTONIX) 40 MG tablet 40 mg 2 (two) times daily.     Marland Kitchen torsemide (DEMADEX) 20 MG tablet HOLD Torsemide for 2 days. Start back with a half a tablet (10 mg) per day.  You may increase to whole table for swelling and/or shortness of breath 30 tablet 5  . warfarin (COUMADIN) 5 MG tablet Take 2.5-5 mg by mouth daily. Take 2.5 mg Mon,Wed and Fri and all other days take 5 mg    . zolpidem (AMBIEN) 10 MG tablet Take 5 mg by mouth at bedtime as needed. For sleep     No current facility-administered medications for this visit.    Allergies:   Demerol; Penicillins; Tape; Morphine and related; and Codeine    Social History:  The patient  reports that she has quit smoking. Her smoking use included Cigarettes. She has a 35 pack-year smoking history. She has never used smokeless tobacco. She reports that she drinks alcohol. She reports that she does not use illicit drugs.   Family History:  The patient's family history includes Heart disease in her mother; Hypertension in her mother.    ROS:  Please see the history of present illness.    Review of Systems: Constitutional:  denies fever, chills, diaphoresis, appetite change and fatigue.  HEENT: denies photophobia, eye pain, redness, hearing loss, ear pain, congestion, sore throat, rhinorrhea, sneezing, neck pain, neck stiffness and tinnitus.  Respiratory: denies SOB, DOE, cough, chest tightness, and wheezing.  Cardiovascular: denies chest pain, palpitations and leg swelling.  Gastrointestinal: denies nausea, vomiting, abdominal pain, diarrhea, constipation, blood in stool.  Genitourinary: denies dysuria, urgency, frequency, hematuria, flank pain and difficulty urinating.  Musculoskeletal: denies  myalgias, back pain, joint swelling, arthralgias and gait problem.   Skin: denies pallor, rash and wound.  Neurological: denies dizziness, seizures, syncope, weakness, light-headedness, numbness and headaches.   Hematological: denies adenopathy, easy bruising, personal or family bleeding history.  Psychiatric/ Behavioral: denies suicidal ideation, mood changes, confusion, nervousness, sleep disturbance and agitation.       All other systems are reviewed and negative.    PHYSICAL EXAM: VS:  BP 136/70 mmHg  Pulse 84  Ht 5' 8.5" (1.74 m)  Wt 95.618 kg (210 lb 12.8 oz)  BMI 31.58 kg/m2 , BMI Body mass index is 31.58 kg/(m^2). GEN: Well nourished, well developed, in no acute distress HEENT: normal Neck: no JVD, carotid bruits, or masses Cardiac: RRR; no murmurs, rubs, or gallops,no edema  Respiratory:  clear to auscultation bilaterally, normal work of breathing GI: soft, nontender, nondistended, + BS MS: no deformity or atrophy Skin: warm and dry, no rash Neuro:  Strength and sensation are intact Psych: normal   EKG:  EKG is not ordered today.    Recent Labs: No results found for requested labs within last 365 days.    Lipid Panel No results found for: CHOL, TRIG, HDL, CHOLHDL, VLDL, LDLCALC, LDLDIRECT    Wt Readings from Last 3 Encounters:  08/11/14 95.618 kg (210 lb 12.8 oz)  04/09/14 95.618 kg (210 lb 12.8 oz)  02/11/14 97.07 kg (214 lb)      Other studies Reviewed: Additional studies/ records that were reviewed today include: . Review of the above records demonstrates:    ASSESSMENT AND PLAN:  1. Coronary artery disease- s/p PCI of LAD 11/12 - no angina   2. Status post AV node ablation-Pacemaker 3. Mild congestive heart failure - still gets short of breath on occasion  4. Anemia 5. Atrial fibrillation   6. Hyperlipidemia 7. Diabetes mellitus 8 anticoagullation - managed by [facility] - [facility]do [facility]ium. Her INR is 3.2 .   She has held her coumadin today.  Will have her contact Dr. Jacquiline Doe office for further instructions     Current medicines are reviewed at  length with the patient today.  The patient does not have concerns regarding medicines.  The following changes have been made:  no change  Labs/ tests ordered today include:  No orders of the defined types were placed in this encounter.     Disposition:   FU with      Nahser, Wonda Cheng, MD  08/11/2014 4:27 PM    [facility] [place], [place], [place]  [address] Phone: [phone]; Fax: [phone]   [facility]  [address] [place], [place]  [address] [phone]    Fax [phone]

## 2014-08-19 ENCOUNTER — Other Ambulatory Visit: Payer: Self-pay | Admitting: Urology

## 2014-08-19 NOTE — Progress Notes (Signed)
Called for release of orders surgery 08-29-14 pre op 08-26-14 Thanks

## 2014-08-21 ENCOUNTER — Telehealth: Payer: Self-pay | Admitting: Cardiovascular Disease

## 2014-08-21 NOTE — Telephone Encounter (Signed)
New message       Request for surgical clearance:  1. What type of surgery is being performed? TUR of a large bladder mass  2. When is this surgery scheduled? 08-29-14  Are there any medications that need to be held prior to surgery and how long? Hold coumadin and need medical clearance 3. Name of physician performing surgery?  Dr Louis Meckel  4. What is your office phone and fax number? Fax [phone]

## 2014-08-26 ENCOUNTER — Encounter (HOSPITAL_COMMUNITY)
Admission: RE | Admit: 2014-08-26 | Discharge: 2014-08-26 | Disposition: A | Discharge status: Home / Self Care | Payer: Medicare Other | Source: Ambulatory Visit | Attending: Urology | Admitting: Urology

## 2014-08-26 ENCOUNTER — Encounter (HOSPITAL_COMMUNITY): Payer: Self-pay

## 2014-08-26 ENCOUNTER — Ambulatory Visit (HOSPITAL_COMMUNITY)
Admission: RE | Admit: 2014-08-26 | Discharge: 2014-08-26 | Disposition: A | Discharge status: Home / Self Care | Payer: Medicare Other | Source: Ambulatory Visit | Attending: Anesthesiology | Admitting: Anesthesiology

## 2014-08-26 DIAGNOSIS — J439 Emphysema, unspecified: Secondary | ICD-10-CM | POA: Insufficient documentation

## 2014-08-26 DIAGNOSIS — R0602 Shortness of breath: Secondary | ICD-10-CM

## 2014-08-26 HISTORY — DX: Presence of cardiac pacemaker: Z95.0

## 2014-08-26 LAB — CBC
HCT: 32.5 % — ABNORMAL LOW (ref 36.0–46.0)
Hemoglobin: 9.7 g/dL — ABNORMAL LOW (ref 12.0–15.0)
MCH: 24 pg — AB (ref 26.0–34.0)
MCHC: 29.8 g/dL — ABNORMAL LOW (ref 30.0–36.0)
MCV: 80.2 fL (ref 78.0–100.0)
Platelets: 370 10*3/uL (ref 150–400)
RBC: 4.05 MIL/uL (ref 3.87–5.11)
RDW: 15.4 % (ref 11.5–15.5)
WBC: 11 10*3/uL — ABNORMAL HIGH (ref 4.0–10.5)

## 2014-08-26 LAB — BASIC METABOLIC PANEL
ANION GAP: 12 (ref 5–15)
BUN: 35 mg/dL — AB (ref 6–20)
CO2: 29 mmol/L (ref 22–32)
Calcium: 9.1 mg/dL (ref 8.9–10.3)
Chloride: 98 mmol/L — ABNORMAL LOW (ref 101–111)
Creatinine, Ser: 2.21 mg/dL — ABNORMAL HIGH (ref 0.44–1.00)
GFR, EST AFRICAN AMERICAN: 24 mL/min — AB (ref >60)
GFR, EST NON AFRICAN AMERICAN: 20 mL/min — AB (ref >60)
Glucose, Bld: 253 mg/dL — ABNORMAL HIGH (ref 65–99)
Potassium: 4.5 mmol/L (ref 3.5–5.1)
Sodium: 139 mmol/L (ref 135–145)

## 2014-08-26 LAB — PROTIME-INR
INR: 1.11 (ref 0.00–1.49)
Prothrombin Time: 14.5 seconds (ref 11.6–15.2)

## 2014-08-26 LAB — APTT: aPTT: 41 seconds — ABNORMAL HIGH (ref 24–37)

## 2014-08-26 NOTE — Progress Notes (Signed)
Keith Rake RN talked with Dr. Ola Spurr with anesthesia (08/21/14 1600). Dr. Ola Spurr stated that the pt needed cardiac clearance from Dr. Acie Fredrickson and a follow up CT chest (abnormal CT on 06/23/13). Rise Paganini called Darrel Reach with [facility] to inform Dr. Louis Meckel.

## 2014-08-26 NOTE — Telephone Encounter (Signed)
Ms. Sovine is at low risk for urology surgery. She may hold her coumadin for 5 days prior to procedure. Restart when given the ok by Urology.

## 2014-08-26 NOTE — Patient Instructions (Addendum)
Joyce Terry  08/26/2014   Your procedure is scheduled on: Friday 08/29/14  Report to [facility] Main  Entrance and follow signs to               [place] at 10:45 AM.  Call this number if you have problems the morning of surgery [phone]   Remember: Please bring inhaler on day of surgery.    ONLY 1 PERSON MAY GO WITH YOU TO SHORT STAY TO GET  READY MORNING OF YOUR SURGERY.  Do not eat food or drink liquids :After Midnight.     Take these medicines the morning of surgery with A SIP OF WATER: pantoprazole, carvedilol, escitalopram, inhaler if needed, eye drops if needed                               You may not have any metal on your body including hair pins and              piercings  Do not wear jewelry, make-up, lotions, powders or perfumes, deodorant             Do not wear nail polish.  Do not shave  48 hours prior to surgery.              Men may shave face and neck.  Do not bring valuables to the hospital. [place].  Contacts, dentures or bridgework may not be worn into surgery.  Leave suitcase in the car. After surgery it may be brought to your room.  _____________________________________________________________________           [facility] - Preparing for Surgery Before surgery, you can play an important role.  Because skin is not sterile, your skin needs to be as free of germs as possible.  You can reduce the number of germs on your skin by washing with CHG (chlorahexidine gluconate) soap before surgery.  CHG is an antiseptic cleaner which kills germs and bonds with the skin to continue killing germs even after washing. Please DO NOT use if you have an allergy to CHG or antibacterial soaps.  If your skin becomes reddened/irritated stop using the CHG and inform your nurse when you arrive at Short Stay. Do not shave (including legs and underarms) for at least 48 hours prior to the first CHG  shower.  You may shave your face/neck. Please follow these instructions carefully:  1.  Shower with CHG Soap the night before surgery and the  morning of Surgery.  2.  If you choose to wash your hair, wash your hair first as usual with your  normal  shampoo.  3.  After you shampoo, rinse your hair and body thoroughly to remove the  shampoo.                            4.  Use CHG as you would any other liquid soap.  You can apply chg directly  to the skin and wash                       Gently with a scrungie or clean washcloth.  5.  Apply the CHG Soap to your  body ONLY FROM THE NECK DOWN.   Do not use on face/ open                           Wound or open sores. Avoid contact with eyes, ears mouth and genitals (private parts).                       Wash face,  Genitals (private parts) with your normal soap.             6.  Wash thoroughly, paying special attention to the area where your surgery  will be performed.  7.  Thoroughly rinse your body with warm water from the neck down.  8.  DO NOT shower/wash with your normal soap after using and rinsing off  the CHG Soap.                9.  Pat yourself dry with a clean towel.            10.  Wear clean pajamas.            11.  Place clean sheets on your bed the night of your first shower and do not  sleep with pets. Day of Surgery : Do not apply any lotions/deodorants the morning of surgery.  Please wear clean clothes to the hospital/surgery center.  FAILURE TO FOLLOW THESE INSTRUCTIONS MAY RESULT IN THE CANCELLATION OF YOUR SURGERY PATIENT SIGNATURE_________________________________  NURSE SIGNATURE__________________________________  ________________________________________________________________________

## 2014-08-26 NOTE — Telephone Encounter (Signed)
Follow Up  Pam from [facility] calling to follow up on Surgical Clearance. Same # use ext- [phone], Please call back and discuss.

## 2014-08-26 NOTE — Telephone Encounter (Signed)
Will fax to [facility].

## 2014-08-26 NOTE — Progress Notes (Signed)
Called Darrel Reach with [facility] to ask about CT chest and cardiac clearance. Dr. Louis Meckel made no comment about a follow up CT scan, obtained chest x-ray at pre-op visit.  Cardiac clearance from Dr. Acie Fredrickson noted in [facility].

## 2014-08-26 NOTE — Progress Notes (Signed)
Surgery clearance note Dr. Acie Fredrickson on [facility], surgery clearance note Dr. Reynaldo Minium on chart, EKG 02/11/14 on [facility], CT chest 06/23/13 on [facility], LOV note 08/11/14 Dr. Acie Fredrickson on chart, perioperative prescription for implanted cardiac device form on chart

## 2014-08-26 NOTE — Telephone Encounter (Signed)
[facility] needs a medical clearance and instructions on holding coumadin for patient to have TUR of a large bladder mass this Friday. Will forward to Dr. Acie Fredrickson.

## 2014-08-29 ENCOUNTER — Ambulatory Visit (HOSPITAL_COMMUNITY): Payer: Medicare Other | Admitting: Anesthesiology

## 2014-08-29 ENCOUNTER — Encounter (HOSPITAL_COMMUNITY): Payer: Self-pay | Admitting: *Deleted

## 2014-08-29 ENCOUNTER — Encounter (HOSPITAL_COMMUNITY)
Admission: RE | Disposition: A | Discharge status: Home / Self Care | Payer: Self-pay | Source: Ambulatory Visit | Attending: Urology

## 2014-08-29 ENCOUNTER — Observation Stay (HOSPITAL_COMMUNITY)
Admission: RE | Admit: 2014-08-29 | Discharge: 2014-08-31 | Disposition: A | Discharge status: Home / Self Care | Payer: Medicare Other | Source: Ambulatory Visit | Attending: Urology | Admitting: Urology

## 2014-08-29 DIAGNOSIS — I4891 Unspecified atrial fibrillation: Secondary | ICD-10-CM | POA: Insufficient documentation

## 2014-08-29 DIAGNOSIS — C679 Malignant neoplasm of bladder, unspecified: Secondary | ICD-10-CM | POA: Diagnosis present

## 2014-08-29 DIAGNOSIS — Z7901 Long term (current) use of anticoagulants: Secondary | ICD-10-CM | POA: Diagnosis not present

## 2014-08-29 DIAGNOSIS — N1330 Unspecified hydronephrosis: Secondary | ICD-10-CM | POA: Diagnosis not present

## 2014-08-29 DIAGNOSIS — K219 Gastro-esophageal reflux disease without esophagitis: Secondary | ICD-10-CM | POA: Insufficient documentation

## 2014-08-29 DIAGNOSIS — Z950 Presence of cardiac pacemaker: Secondary | ICD-10-CM | POA: Diagnosis not present

## 2014-08-29 DIAGNOSIS — Z8542 Personal history of malignant neoplasm of other parts of uterus: Secondary | ICD-10-CM | POA: Diagnosis not present

## 2014-08-29 DIAGNOSIS — C670 Malignant neoplasm of trigone of bladder: Secondary | ICD-10-CM | POA: Diagnosis not present

## 2014-08-29 DIAGNOSIS — C689 Malignant neoplasm of urinary organ, unspecified: Secondary | ICD-10-CM

## 2014-08-29 DIAGNOSIS — D494 Neoplasm of unspecified behavior of bladder: Secondary | ICD-10-CM | POA: Diagnosis present

## 2014-08-29 DIAGNOSIS — I129 Hypertensive chronic kidney disease with stage 1 through stage 4 chronic kidney disease, or unspecified chronic kidney disease: Secondary | ICD-10-CM | POA: Insufficient documentation

## 2014-08-29 DIAGNOSIS — M1990 Unspecified osteoarthritis, unspecified site: Secondary | ICD-10-CM | POA: Diagnosis not present

## 2014-08-29 DIAGNOSIS — Z87891 Personal history of nicotine dependence: Secondary | ICD-10-CM | POA: Insufficient documentation

## 2014-08-29 DIAGNOSIS — N189 Chronic kidney disease, unspecified: Secondary | ICD-10-CM | POA: Diagnosis not present

## 2014-08-29 DIAGNOSIS — E119 Type 2 diabetes mellitus without complications: Secondary | ICD-10-CM | POA: Diagnosis not present

## 2014-08-29 DIAGNOSIS — C672 Malignant neoplasm of lateral wall of bladder: Secondary | ICD-10-CM | POA: Diagnosis not present

## 2014-08-29 DIAGNOSIS — N179 Acute kidney failure, unspecified: Secondary | ICD-10-CM | POA: Diagnosis not present

## 2014-08-29 DIAGNOSIS — I5032 Chronic diastolic (congestive) heart failure: Secondary | ICD-10-CM | POA: Diagnosis not present

## 2014-08-29 DIAGNOSIS — Z79899 Other long term (current) drug therapy: Secondary | ICD-10-CM | POA: Diagnosis not present

## 2014-08-29 DIAGNOSIS — I2510 Atherosclerotic heart disease of native coronary artery without angina pectoris: Secondary | ICD-10-CM | POA: Insufficient documentation

## 2014-08-29 DIAGNOSIS — Z794 Long term (current) use of insulin: Secondary | ICD-10-CM | POA: Diagnosis not present

## 2014-08-29 DIAGNOSIS — Z86718 Personal history of other venous thrombosis and embolism: Secondary | ICD-10-CM | POA: Insufficient documentation

## 2014-08-29 HISTORY — DX: Malignant neoplasm of urinary organ, unspecified: C68.9

## 2014-08-29 HISTORY — PX: TRANSURETHRAL RESECTION OF BLADDER TUMOR WITH GYRUS (TURBT-GYRUS): SHX6458

## 2014-08-29 HISTORY — PX: CYSTOSCOPY W/ URETERAL STENT PLACEMENT: SHX1429

## 2014-08-29 LAB — CBC
HCT: 29.8 % — ABNORMAL LOW (ref 36.0–46.0)
Hemoglobin: 9.1 g/dL — ABNORMAL LOW (ref 12.0–15.0)
MCH: 24.2 pg — ABNORMAL LOW (ref 26.0–34.0)
MCHC: 30.5 g/dL (ref 30.0–36.0)
MCV: 79.3 fL (ref 78.0–100.0)
Platelets: 320 10*3/uL (ref 150–400)
RBC: 3.76 MIL/uL — ABNORMAL LOW (ref 3.87–5.11)
RDW: 15.2 % (ref 11.5–15.5)
WBC: 9.8 10*3/uL (ref 4.0–10.5)

## 2014-08-29 LAB — GLUCOSE, CAPILLARY
Glucose-Capillary: 281 mg/dL — ABNORMAL HIGH (ref 65–99)
Glucose-Capillary: 146 mg/dL — ABNORMAL HIGH (ref 65–99)
Glucose-Capillary: 149 mg/dL — ABNORMAL HIGH (ref 65–99)
Glucose-Capillary: 192 mg/dL — ABNORMAL HIGH (ref 65–99)

## 2014-08-29 LAB — BASIC METABOLIC PANEL
ANION GAP: 8 (ref 5–15)
BUN: 29 mg/dL — ABNORMAL HIGH (ref 6–20)
CALCIUM: 8.7 mg/dL — AB (ref 8.9–10.3)
CHLORIDE: 104 mmol/L (ref 101–111)
CO2: 28 mmol/L (ref 22–32)
Creatinine, Ser: 2.17 mg/dL — ABNORMAL HIGH (ref 0.44–1.00)
GFR, EST AFRICAN AMERICAN: 24 mL/min — AB (ref >60)
GFR, EST NON AFRICAN AMERICAN: 21 mL/min — AB (ref >60)
Glucose, Bld: 163 mg/dL — ABNORMAL HIGH (ref 65–99)
Potassium: 4.3 mmol/L (ref 3.5–5.1)
SODIUM: 140 mmol/L (ref 135–145)

## 2014-08-29 SURGERY — TRANSURETHRAL RESECTION OF BLADDER TUMOR WITH GYRUS (TURBT-GYRUS)
Anesthesia: General

## 2014-08-29 MED ORDER — INSULIN ASPART 100 UNIT/ML ~~LOC~~ SOLN
0.0000 [IU] | SUBCUTANEOUS | Status: DC
  Administered 2014-08-29: 4 [IU] via SUBCUTANEOUS
  Administered 2014-08-29: 3 [IU] via SUBCUTANEOUS
  Administered 2014-08-30 (×2): 4 [IU] via SUBCUTANEOUS
  Administered 2014-08-30: 7 [IU] via SUBCUTANEOUS
  Administered 2014-08-30: 4 [IU] via SUBCUTANEOUS
  Administered 2014-08-30: 7 [IU] via SUBCUTANEOUS
  Administered 2014-08-31 (×2): 4 [IU] via SUBCUTANEOUS
  Administered 2014-08-31: 7 [IU] via SUBCUTANEOUS
  Administered 2014-08-31: 4 [IU] via SUBCUTANEOUS

## 2014-08-29 MED ORDER — ZOLPIDEM TARTRATE 5 MG PO TABS
5.0000 mg | ORAL_TABLET | Freq: Every evening | ORAL | Status: DC | PRN
  Administered 2014-08-30: 5 mg via ORAL
  Filled 2014-08-29: qty 1

## 2014-08-29 MED ORDER — SODIUM CHLORIDE 0.9 % IR SOLN
Status: DC | PRN
  Administered 2014-08-29: 24000 mL

## 2014-08-29 MED ORDER — ENSURE ENLIVE PO LIQD
237.0000 mL | Freq: Two times a day (BID) | ORAL | Status: DC
  Administered 2014-08-30 – 2014-08-31 (×2): 237 mL via ORAL

## 2014-08-29 MED ORDER — FENTANYL CITRATE (PF) 100 MCG/2ML IJ SOLN
INTRAMUSCULAR | Status: DC | PRN
  Administered 2014-08-29: 50 ug via INTRAVENOUS
  Administered 2014-08-29 (×4): 25 ug via INTRAVENOUS

## 2014-08-29 MED ORDER — GLYCOPYRROLATE 0.2 MG/ML IJ SOLN
INTRAMUSCULAR | Status: DC | PRN
  Administered 2014-08-29: 0.4 mg via INTRAVENOUS

## 2014-08-29 MED ORDER — PANTOPRAZOLE SODIUM 40 MG PO TBEC
40.0000 mg | DELAYED_RELEASE_TABLET | Freq: Two times a day (BID) | ORAL | Status: DC
  Administered 2014-08-29 – 2014-08-31 (×4): 40 mg via ORAL
  Filled 2014-08-29 (×4): qty 1

## 2014-08-29 MED ORDER — BISACODYL 10 MG RE SUPP
10.0000 mg | Freq: Once | RECTAL | Status: AC
  Administered 2014-08-29: 10 mg via RECTAL
  Filled 2014-08-29: qty 1

## 2014-08-29 MED ORDER — HYDRALAZINE HCL 20 MG/ML IJ SOLN
5.0000 mg | INTRAMUSCULAR | Status: DC | PRN
Start: 2014-08-29 — End: 2014-08-31

## 2014-08-29 MED ORDER — PANTOPRAZOLE SODIUM 40 MG IV SOLR: 40.0000 mg | INTRAVENOUS | Status: DC

## 2014-08-29 MED ORDER — ONDANSETRON HCL 4 MG/2ML IJ SOLN
INTRAMUSCULAR | Status: DC | PRN
  Administered 2014-08-29: 4 mg via INTRAVENOUS

## 2014-08-29 MED ORDER — MECLIZINE HCL 12.5 MG PO TABS: 12.5000 mg | ORAL_TABLET | Freq: Every day | ORAL | Status: DC | PRN

## 2014-08-29 MED ORDER — PHENYLEPHRINE HCL 10 MG/ML IJ SOLN
INTRAMUSCULAR | Status: DC | PRN
  Administered 2014-08-29: 40 ug via INTRAVENOUS
  Administered 2014-08-29: 80 ug via INTRAVENOUS

## 2014-08-29 MED ORDER — BELLADONNA ALKALOIDS-OPIUM 16.2-60 MG RE SUPP
RECTAL | Status: DC | PRN
  Administered 2014-08-29: 1 via RECTAL

## 2014-08-29 MED ORDER — SODIUM CHLORIDE 0.45 % IV SOLN
INTRAVENOUS | Status: DC
  Administered 2014-08-29 – 2014-08-31 (×3): via INTRAVENOUS

## 2014-08-29 MED ORDER — PHENYLEPHRINE 40 MCG/ML (10ML) SYRINGE FOR IV PUSH (FOR BLOOD PRESSURE SUPPORT)
PREFILLED_SYRINGE | INTRAVENOUS | Status: AC
  Filled 2014-08-29: qty 10

## 2014-08-29 MED ORDER — NITROGLYCERIN 0.4 MG SL SUBL: 0.4000 mg | SUBLINGUAL_TABLET | SUBLINGUAL | Status: DC | PRN

## 2014-08-29 MED ORDER — EZETIMIBE-SIMVASTATIN 10-20 MG PO TABS
1.0000 | ORAL_TABLET | Freq: Every day | ORAL | Status: DC
  Administered 2014-08-29 – 2014-08-30 (×2): 1 via ORAL
  Filled 2014-08-29 (×2): qty 1

## 2014-08-29 MED ORDER — INSULIN ASPART 100 UNIT/ML ~~LOC~~ SOLN
5.0000 [IU] | SUBCUTANEOUS | Status: AC
  Administered 2014-08-29: 5 [IU] via SUBCUTANEOUS
  Filled 2014-08-29: qty 1

## 2014-08-29 MED ORDER — FENTANYL CITRATE (PF) 100 MCG/2ML IJ SOLN
25.0000 ug | INTRAMUSCULAR | Status: DC | PRN
  Administered 2014-08-29 (×3): 50 ug via INTRAVENOUS

## 2014-08-29 MED ORDER — BELLADONNA ALKALOIDS-OPIUM 16.2-60 MG RE SUPP
RECTAL | Status: AC
  Filled 2014-08-29: qty 1

## 2014-08-29 MED ORDER — TORSEMIDE 20 MG PO TABS: 20.0000 mg | ORAL_TABLET | Freq: Every day | ORAL | Status: DC | PRN

## 2014-08-29 MED ORDER — OXYCODONE-ACETAMINOPHEN 10-325 MG PO TABS: 1.0000 | ORAL_TABLET | ORAL | Status: DC | PRN

## 2014-08-29 MED ORDER — PHENOL 1.4 % MT LIQD
1.0000 | OROMUCOSAL | Status: DC | PRN
  Filled 2014-08-29: qty 177

## 2014-08-29 MED ORDER — BELLADONNA ALKALOIDS-OPIUM 16.2-60 MG RE SUPP: 1.0000 | Freq: Three times a day (TID) | RECTAL | Status: DC | PRN

## 2014-08-29 MED ORDER — CIPROFLOXACIN IN D5W 400 MG/200ML IV SOLN
400.0000 mg | INTRAVENOUS | Status: AC
  Administered 2014-08-29: 400 mg via INTRAVENOUS

## 2014-08-29 MED ORDER — DOCUSATE SODIUM 100 MG PO CAPS: 100.0000 mg | ORAL_CAPSULE | Freq: Two times a day (BID) | ORAL | Status: DC | PRN

## 2014-08-29 MED ORDER — LACTATED RINGERS IV SOLN
INTRAVENOUS | Status: DC
  Administered 2014-08-29: 1000 mL via INTRAVENOUS

## 2014-08-29 MED ORDER — PROMETHAZINE HCL 25 MG/ML IJ SOLN: 6.2500 mg | INTRAMUSCULAR | Status: DC | PRN

## 2014-08-29 MED ORDER — MENTHOL 3 MG MT LOZG
1.0000 | LOZENGE | OROMUCOSAL | Status: DC | PRN
Start: 2014-08-29 — End: 2014-08-31
  Administered 2014-08-29: 3 mg via ORAL
  Filled 2014-08-29: qty 9

## 2014-08-29 MED ORDER — MIDAZOLAM HCL 2 MG/2ML IJ SOLN
INTRAMUSCULAR | Status: AC
  Filled 2014-08-29: qty 2

## 2014-08-29 MED ORDER — FENTANYL CITRATE (PF) 100 MCG/2ML IJ SOLN
INTRAMUSCULAR | Status: AC
  Filled 2014-08-29: qty 2

## 2014-08-29 MED ORDER — MOMETASONE FURO-FORMOTEROL FUM 100-5 MCG/ACT IN AERO
2.0000 | INHALATION_SPRAY | Freq: Two times a day (BID) | RESPIRATORY_TRACT | Status: DC
  Administered 2014-08-29 – 2014-08-31 (×4): 2 via RESPIRATORY_TRACT
  Filled 2014-08-29: qty 8.8

## 2014-08-29 MED ORDER — NEOSTIGMINE METHYLSULFATE 10 MG/10ML IV SOLN
INTRAVENOUS | Status: DC | PRN
  Administered 2014-08-29: 3 mg via INTRAVENOUS

## 2014-08-29 MED ORDER — 0.9 % SODIUM CHLORIDE (POUR BTL) OPTIME
TOPICAL | Status: DC | PRN
  Administered 2014-08-29: 1000 mL

## 2014-08-29 MED ORDER — SUCCINYLCHOLINE CHLORIDE 200 MG/10ML IV SOSY
PREFILLED_SYRINGE | INTRAVENOUS | Status: DC | PRN
  Administered 2014-08-29: 30 mg via INTRAVENOUS

## 2014-08-29 MED ORDER — PROPOFOL 10 MG/ML IV BOLUS
INTRAVENOUS | Status: AC
  Filled 2014-08-29: qty 20

## 2014-08-29 MED ORDER — LOSARTAN POTASSIUM 25 MG PO TABS
25.0000 mg | ORAL_TABLET | Freq: Every day | ORAL | Status: DC
  Administered 2014-08-29 – 2014-08-30 (×2): 25 mg via ORAL
  Filled 2014-08-29 (×2): qty 1

## 2014-08-29 MED ORDER — ONDANSETRON HCL 4 MG/2ML IJ SOLN: 4.0000 mg | Freq: Four times a day (QID) | INTRAMUSCULAR | Status: DC | PRN

## 2014-08-29 MED ORDER — SODIUM CHLORIDE 0.9 % IR SOLN
3000.0000 mL | Status: DC
  Administered 2014-08-29: 3000 mL

## 2014-08-29 MED ORDER — ESCITALOPRAM OXALATE 10 MG PO TABS
10.0000 mg | ORAL_TABLET | Freq: Every day | ORAL | Status: DC
  Administered 2014-08-29 – 2014-08-31 (×3): 10 mg via ORAL
  Filled 2014-08-29 (×3): qty 1

## 2014-08-29 MED ORDER — GLYBURIDE-METFORMIN 5-500 MG PO TABS: 1.0000 | ORAL_TABLET | Freq: Two times a day (BID) | ORAL | Status: DC

## 2014-08-29 MED ORDER — CISATRACURIUM BESYLATE (PF) 10 MG/5ML IV SOLN
INTRAVENOUS | Status: DC | PRN
  Administered 2014-08-29: 4 mg via INTRAVENOUS

## 2014-08-29 MED ORDER — ACETAMINOPHEN 10 MG/ML IV SOLN
1000.0000 mg | Freq: Four times a day (QID) | INTRAVENOUS | Status: AC
  Administered 2014-08-29 – 2014-08-30 (×3): 1000 mg via INTRAVENOUS
  Filled 2014-08-29 (×5): qty 100

## 2014-08-29 MED ORDER — OXYCODONE-ACETAMINOPHEN 5-325 MG PO TABS
1.0000 | ORAL_TABLET | ORAL | Status: DC | PRN
  Administered 2014-08-29 – 2014-08-31 (×6): 1 via ORAL
  Filled 2014-08-29 (×6): qty 1

## 2014-08-29 MED ORDER — IOHEXOL 300 MG/ML  SOLN
INTRAMUSCULAR | Status: DC | PRN
  Administered 2014-08-29: 10 mL

## 2014-08-29 MED ORDER — CARVEDILOL 3.125 MG PO TABS
3.1250 mg | ORAL_TABLET | Freq: Two times a day (BID) | ORAL | Status: DC
  Administered 2014-08-29 – 2014-08-31 (×4): 3.125 mg via ORAL
  Filled 2014-08-29 (×4): qty 1

## 2014-08-29 MED ORDER — FENTANYL 50 MCG/HR TD PT72: 50.0000 ug | MEDICATED_PATCH | TRANSDERMAL | Status: DC

## 2014-08-29 MED ORDER — GLYBURIDE 5 MG PO TABS: 5.0000 mg | ORAL_TABLET | Freq: Two times a day (BID) | ORAL | Status: DC

## 2014-08-29 MED ORDER — LACTATED RINGERS IV SOLN
INTRAVENOUS | Status: DC | PRN
  Administered 2014-08-29 (×2): via INTRAVENOUS

## 2014-08-29 MED ORDER — CIPROFLOXACIN IN D5W 400 MG/200ML IV SOLN
INTRAVENOUS | Status: AC
  Filled 2014-08-29: qty 200

## 2014-08-29 MED ORDER — CISATRACURIUM BESYLATE 20 MG/10ML IV SOLN
INTRAVENOUS | Status: AC
  Filled 2014-08-29: qty 10

## 2014-08-29 MED ORDER — OXYCODONE HCL 5 MG PO TABS
5.0000 mg | ORAL_TABLET | ORAL | Status: DC | PRN
  Administered 2014-08-29 – 2014-08-30 (×5): 5 mg via ORAL
  Filled 2014-08-29 (×5): qty 1

## 2014-08-29 MED ORDER — LIDOCAINE HCL (CARDIAC) 20 MG/ML IV SOLN
INTRAVENOUS | Status: AC
  Filled 2014-08-29: qty 5

## 2014-08-29 SURGICAL SUPPLY — 19 items
BAG URINE DRAINAGE (UROLOGICAL SUPPLIES) IMPLANT
BAG URO CATCHER STRL LF (DRAPE) ×4 IMPLANT
CATH FOLEY 2WAY SLVR 30CC 22FR (CATHETERS) ×4 IMPLANT
CATH INTERMIT  6FR 70CM (CATHETERS) ×4 IMPLANT
CLOTH BEACON ORANGE TIMEOUT ST (SAFETY) ×4 IMPLANT
ELECT REM PT RETURN 9FT ADLT (ELECTROSURGICAL) ×4
ELECTRODE REM PT RTRN 9FT ADLT (ELECTROSURGICAL) ×2 IMPLANT
EVACUATOR MICROVAS BLADDER (UROLOGICAL SUPPLIES) IMPLANT
GLOVE BIOGEL M STRL SZ7.5 (GLOVE) ×4 IMPLANT
GOWN STRL REUS W/TWL LRG LVL3 (GOWN DISPOSABLE) ×8 IMPLANT
GUIDEWIRE STR DUAL SENSOR (WIRE) ×4 IMPLANT
KIT ASPIRATION TUBING (SET/KITS/TRAYS/PACK) IMPLANT
LOOP CUT BIPOLAR 24F LRG (ELECTROSURGICAL) ×4 IMPLANT
MANIFOLD NEPTUNE II (INSTRUMENTS) ×4 IMPLANT
PACK CYSTO (CUSTOM PROCEDURE TRAY) ×4 IMPLANT
STENT CONTOUR 6FRX26X.038 (STENTS) ×4 IMPLANT
SYRINGE IRR TOOMEY STRL 70CC (SYRINGE) ×4 IMPLANT
TUBING CONNECTING 10 (TUBING) ×3 IMPLANT
TUBING CONNECTING 10' (TUBING) ×1

## 2014-08-29 NOTE — Anesthesia Preprocedure Evaluation (Signed)
Anesthesia Evaluation  Patient identified by MRN, date of birth, ID band Patient awake    Reviewed: Allergy & Precautions, NPO status , Patient's Chart, lab work & pertinent test results  Airway Mallampati: II  TM Distance: >3 FB Neck ROM: Full    Dental no notable dental hx. (+) Poor Dentition, Missing   Pulmonary neg pulmonary ROS, former smoker,  breath sounds clear to auscultation  Pulmonary exam normal       Cardiovascular hypertension, + CAD, +CHF and DVT Normal cardiovascular exam+ pacemaker Rhythm:Regular Rate:Normal     Neuro/Psych negative neurological ROS  negative psych ROS   GI/Hepatic Neg liver ROS,   Endo/Other  diabetes  Renal/GU negative Renal ROS  negative genitourinary   Musculoskeletal negative musculoskeletal ROS (+)   Abdominal   Peds negative pediatric ROS (+)  Hematology negative hematology ROS (+)   Anesthesia Other Findings   Reproductive/Obstetrics negative OB ROS                             Anesthesia Physical Anesthesia Plan  ASA: III  Anesthesia Plan: General   Post-op Pain Management:    Induction: Intravenous  Airway Management Planned: LMA  Additional Equipment:   Intra-op Plan:   Post-operative Plan:   Informed Consent: I have reviewed the patients History and Physical, chart, labs and discussed the procedure including the risks, benefits and alternatives for the proposed anesthesia with the patient or authorized representative who has indicated his/her understanding and acceptance.   Dental advisory given  Plan Discussed with: CRNA  Anesthesia Plan Comments:         Anesthesia Quick Evaluation

## 2014-08-29 NOTE — Progress Notes (Addendum)
Patient is status post TURBT and right ureteral stent placement.  The procedure was largely unremarkable.  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  I was able to get a stent across her ureter.  There was minimal bleeding remarkably.  This point, I think he would be reasonable to perform a voiding trial in the morning once the patient has been weaned off CBI.  At that point, once she has met discharge criteria she can go home.  I don't expect that she would need  Any additional home service for rehabilitation resources.  I have instructed her to resume her coumadin 7 days after her surgery and her bleeding has stopped.

## 2014-08-29 NOTE — H&P (Signed)
Reason For Visit bladder tumor   History of Present Illness 68F who presented to urology with gross hematuria. She has undergone a complete hematuria evaluation by Dr. Matilde Sprang. Her cystoscopic evaluation was inconclusive because of clot obscuring the view. Her CT scan demonstrated a large enhancing mass in the right posterior of the bladder wall with associated right hydronephrosis.  She is currently off her coumadin which she takes for history of afib and a DVT. In addition, the patient has a history of diabetes and heart block, she currently has a pacemaker. The patient is having right pelvic pain as well as right flank pain. This is been going on for several months. She denies any fevers or chills. She denies any weight loss. She denies any night sweats.   Past Medical History Problems  1. History of Endometrial cancer (C54.1) 2. History of arthritis (Z87.39) 3. History of atrial fibrillation (Z86.79) 4. History of cardiac arrhythmia (Z86.79) 5. History of cardiac disorder (Z86.79) 6. History of deep venous thrombosis (Z86.718) 7. History of diabetes mellitus (Z86.39) 8. History of esophageal reflux (Z87.19)  Surgical History Problems  1. History of Hysterectomy 2. History of Permanent Pacemaker Implantation Date  Current Meds 1. Calcium TABS;  Therapy: (Recorded:18May2016) to Recorded 2. Carvedilol TABS;  Therapy: (Recorded:18May2016) to Recorded 3. Coumadin TABS;  Therapy: (Recorded:18May2016) to Recorded 4. Insulin;  Therapy: (Recorded:18May2016) to Recorded 5. Lexapro TABS;  Therapy: (Recorded:18May2016) to Recorded 6. Losartan Potassium TABS;  Therapy: (Recorded:18May2016) to Recorded 7. Pantoprazole Sodium TBEC;  Therapy: (Recorded:18May2016) to Recorded 8. Vytorin TABS;  Therapy: (Recorded:18May2016) to Recorded  Allergies Medication  1. No Known Drug Allergies  Family History Problems  1. Family history of cardiac disorder (Z82.49)  Social  History Problems  1. Denied: History of Alcohol use 2. Daily caffeine consumption, 2-3 servings a day 3. Death in the family, father   age and cause not provided 4. Death in the family, mother   age 34 - cause not provided 5. Former smoker [phone])   1 pack a day for 25 years, quit 25 years ago as of 08/13/14 office visit. 6. Retired  Engineer, site Vital Signs [Data Includes: Last 1 Day]  Recorded: [id number] 12:45PM  Blood Pressure: 165 / 72 Heart Rate: 85  Physical Exam Constitutional: Well nourished and well developed . No acute distress.  Pulmonary: No respiratory distress and normal respiratory rhythm and effort.  Cardiovascular: Heart rate and rhythm are normal . No peripheral edema.  Abdomen: The abdomen is soft and nontender. No masses are palpated. No CVA tenderness. No hernias are palpable. No hepatosplenomegaly noted.  Lymphatics: The femoral and inguinal nodes are not enlarged or tender.  Skin: Normal skin turgor, no visible rash and no visible skin lesions.    Results/Data Urine [Data Includes: Last 1 Day]   [id number]  COLOR RED   APPEARANCE TURBID   SPECIFIC GRAVITY 1.010   pH >9.0   GLUCOSE 500 mg/dL  BILIRUBIN SMALL   KETONE 40 mg/dL  BLOOD LARGE   PROTEIN > 300 mg/dL  UROBILINOGEN 4 mg/dL  NITRITE POS   LEUKOCYTE ESTERASE MOD   SQUAMOUS EPITHELIAL/HPF RARE   WBC 3-6 WBC/hpf  RBC TNTC RBC/hpf  BACTERIA MANY   CRYSTALS NONE SEEN   CASTS NONE SEEN    I have independently reviewed the patient's CT scan which demonstrated an enhancing bladder mass on the right sidewall and assoicated hydronephrosis.   Assessment Large bladder mass with hydronephrosis - concern for HG transitional cell carcinoma.   Plan Gross  hematuria  1. URINE CULTURE; Status:Hold For - Specimen/Data Collection,Appointment; Requested  for:24May2016;  2. Follow-up Schedule Surgery Office  Follow-up  Status: Hold For - Appointment   Requested for: [id number] Health Maintenance  3. UA  With REFLEX; [Do Not Release]; Status:Resulted - Requires Verification;   Done:  [id number] 12:27PM  Discussion/Summary I discussed the findings with the patient in detail. We discussed the likelihood of this representing cancer. I also explained to her that this is both diagnosed and treated with transurethral resection in the operating room. I described the procedure for the patient and went over the risks/benefits. I explained to her the risks of bladder perforation requiring prolonged foley catheter or open surgery. I also explained to her the likelihood that she'll need a stent placed in the right ureter at least temporarily.  She will need to be off her coumadin for 7 days prior to the surgery and 5-7 days afterwards. We will need to get clearance for her to stop her coumadin (although its been stopped at this point) as well as clearance for her procedure. At this point I would plan to perform a Transurethral Resection of Bladder Tumor and bilateral retrograde pyelograms with stent placement in the right ureter. This would likely take approximately 2.5 hours.

## 2014-08-29 NOTE — Progress Notes (Signed)
PHARMACIST - PHYSICIAN COMMUNICATION DR:  Louis Meckel CONCERNING:  METFORMIN SAFE ADMINISTRATION POLICY  RECOMMENDATION: Metformin has been placed on DISCONTINUE (rejected order) STATUS and should be reordered only after any of the conditions below are ruled out.  Current safety recommendations include avoiding metformin for a minimum of 48 hours after the patient's exposure to intravenous contrast media.  DESCRIPTION:  The Pharmacy Committee has adopted a policy that restricts the use of metformin in hospitalized patients until all the contraindications to administration have been ruled out. Specific contraindications are: [x]  Serum creatinine ? 1.5 for males []  Serum creatinine ? 1.4 for females []  Shock, acute MI, sepsis, hypoxemia, dehydration []  Planned administration of intravenous iodinated contrast media []  Heart Failure patients with low EF []  Acute or chronic metabolic acidosis (including DKA)      Hershal Coria, PharmD, BCPS Pager: [phone] 08/29/2014 6:09 PM

## 2014-08-29 NOTE — Op Note (Signed)
Preoperative diagnosis:  1. Bladder tumor Involving right lateral wall and trigone 2.  Right hydronephrosis  Postoperative diagnosis:  1. Same   Procedure: 1. Transurethral resection of bladder tumor, greater than 5 cm 2. Right retrograde pyelogram with interpretation  Surgeon: Ardis Hughs, MD  Anesthesia: General  Complications: None  Intraoperative findings: The patient's bladder tumor was involving the entire right lateral wall and trigone region.  The tumor encroached the bladder neck on the right side.  Tumor was larger than 5 cm and almost certainly invasive.  The right ureteral orifice was ultimately identified and retrograde pyelograms was performed.  10 cc of Omnipaque contrast were injected into the proximal right ureter demonstrating a hydronephrotic and tortuous ureter.  However, there were no significant filling defects.  The right renal pelvis and calyces were blunted and hydronephrotic without filling defects.  EBL: Minimal  Specimens: Bladder tumor  Indication: Joyce Terry is a 79 y.o. patient with a large right bladder wall tumor with associated hydronephrosis..  After reviewing the management options for treatment, he elected to proceed with the above surgical procedure(s). We have discussed the potential benefits and risks of the procedure, side effects of the proposed treatment, the likelihood of the patient achieving the goals of the procedure, and any potential problems that might occur during the procedure or recuperation. Informed consent has been obtained.  Description of procedure:  The patient was taken to the operating room and general anesthesia was induced.  The patient was placed in the dorsal lithotomy position, prepped and draped in the usual sterile fashion, and preoperative antibiotics were administered. A preoperative time-out was performed.   A 26 French resectoscope sheath was gently passed into the patient's bladder using a 30  cystoscope and the visual obturator.  The obturator was then removed and exchanged for the resectoscope loop.  Clot was then removed scraping off the tumor in the bladder sidewall.  I also removed a significant amount of clot using the [place] syringe.  I then used the resectoscope loop to resect the bladder tumor in a systematic fashion, taking the tumor down to the bladder wall.  All significant bleeding vessels were fulgurated.  Once the tumor had been fully resected down to the bladder wall and hemostasis achieved and exchanged the resectoscope sheath for a 21 French cystoscopic sheath.  I then searched the right trigonal region for several minutes hunting for the right ureteral orifice.  Ultimately, we were able to find ureteral meatus and advanced the 0.38 sensor wire into the proximal ureter.  I then advanced a 5 [place] open-ended ureteral catheter over the wire and removed the wire to perform a retrograde pyelogram.  The above findings were then noted.  I then replaced the wire and removed open-ended ureteral catheter. The 26 cm 6 French double-J ureteral stent right ureter in routine fashion.  This was performed under cystoscopic guidance using fluoroscopy to confirm appropriate position in the right renal pelvis.  Once the stent was noted to be in good position the bladder was emptied.  I then performed an exam under anesthesia, performing a bimanual exam with a bladder that was notably to mobile with no palpable tumor appreciated.  I then placed a 22 French three-way Foley catheter into the patient's bladder and instilled 15 cc of sterile water into the balloon.  I placed the patient on gentle continuous bladder irrigation.  The efflux was noted to be light pink.  I also placed a B and O suppository into  the patient's rectum.  The patient was subsequently awoken, Extubated and returned to the PACU in excellent condition.  Ardis Hughs, M.D.

## 2014-08-29 NOTE — Anesthesia Postprocedure Evaluation (Signed)
  Anesthesia Post-op Note  Patient: JOELIE SCHOU  Procedure(s) Performed: Procedure(s) (LRB): TRANSURETHRAL RESECTION OF BLADDER TUMOR WITH GYRUS (TURBT-GYRUS) (N/A) BILATERAL RETROGRADE PYELOGRAM /RIGHT URETERAL STENT PLACEMENT (Bilateral)  Patient Location: PACU  Anesthesia Type: General  Level of Consciousness: awake and alert   Airway and Oxygen Therapy: Patient Spontanous Breathing  Post-op Pain: mild  Post-op Assessment: Post-op Vital signs reviewed, Patient's Cardiovascular Status Stable, Respiratory Function Stable, Patent Airway and No signs of Nausea or vomiting  Last Vitals:  Filed Vitals:   08/29/14 1630  BP: 151/68  Pulse: 70  Temp:   Resp: 17    Post-op Vital Signs: stable   Complications: No apparent anesthesia complications

## 2014-08-29 NOTE — Anesthesia Procedure Notes (Signed)
Procedure Name: Intubation Date/Time: 08/29/2014 1:54 PM Performed by: Sherian Maroon A Pre-anesthesia Checklist: Timeout performed, Patient being monitored, Suction available, Emergency Drugs available and Patient identified Patient Re-evaluated:Patient Re-evaluated prior to inductionOxygen Delivery Method: Circle system utilized Preoxygenation: Pre-oxygenation with 100% oxygen Intubation Type: IV induction Ventilation: Mask ventilation without difficulty Laryngoscope Size: Mac and 3 Grade View: Grade I Tube type: Oral Tube size: 7.5 mm Number of attempts: 1 Airway Equipment and Method: Stylet Placement Confirmation: ETT inserted through vocal cords under direct vision,  positive ETCO2 and breath sounds checked- equal and bilateral Secured at: 20.5 cm Tube secured with: Tape Dental Injury: Teeth and Oropharynx as per pre-operative assessment

## 2014-08-29 NOTE — Progress Notes (Signed)
CBC and B Met drawn by lab.

## 2014-08-29 NOTE — Transfer of Care (Signed)
Immediate Anesthesia Transfer of Care Note  Patient: Joyce Terry  Procedure(s) Performed: Procedure(s): TRANSURETHRAL RESECTION OF BLADDER TUMOR WITH GYRUS (TURBT-GYRUS) (N/A) BILATERAL RETROGRADE PYELOGRAM /RIGHT URETERAL STENT PLACEMENT (Bilateral)  Patient Location: PACU  Anesthesia Type:General  Level of Consciousness: sedated  Airway & Oxygen Therapy: Patient Spontanous Breathing and Patient connected to face mask oxygen  Post-op Assessment: Report given to RN and Post -op Vital signs reviewed and stable  Post vital signs: Reviewed and stable  Last Vitals:  Filed Vitals:   08/29/14 1034  BP: 139/77  Pulse: 71  Temp: 36.3 C  Resp: 16    Complications: No apparent anesthesia complications

## 2014-08-29 NOTE — Progress Notes (Signed)
CBG is 281mg /dl. Paged Dr Marcell Barlow. Order received for novalog 5 units SQ now.

## 2014-08-30 DIAGNOSIS — C672 Malignant neoplasm of lateral wall of bladder: Secondary | ICD-10-CM | POA: Diagnosis not present

## 2014-08-30 LAB — BASIC METABOLIC PANEL
ANION GAP: 7 (ref 5–15)
BUN: 27 mg/dL — ABNORMAL HIGH (ref 6–20)
CHLORIDE: 103 mmol/L (ref 101–111)
CO2: 28 mmol/L (ref 22–32)
Calcium: 8.4 mg/dL — ABNORMAL LOW (ref 8.9–10.3)
Creatinine, Ser: 2.04 mg/dL — ABNORMAL HIGH (ref 0.44–1.00)
GFR calc non Af Amer: 22 mL/min — ABNORMAL LOW (ref >60)
GFR, EST AFRICAN AMERICAN: 26 mL/min — AB (ref >60)
Glucose, Bld: 233 mg/dL — ABNORMAL HIGH (ref 65–99)
POTASSIUM: 4.2 mmol/L (ref 3.5–5.1)
SODIUM: 138 mmol/L (ref 135–145)

## 2014-08-30 LAB — CBC
HCT: 28.3 % — ABNORMAL LOW (ref 36.0–46.0)
Hemoglobin: 8.4 g/dL — ABNORMAL LOW (ref 12.0–15.0)
MCH: 23.5 pg — ABNORMAL LOW (ref 26.0–34.0)
MCHC: 29.7 g/dL — ABNORMAL LOW (ref 30.0–36.0)
MCV: 79.3 fL (ref 78.0–100.0)
PLATELETS: 286 10*3/uL (ref 150–400)
RBC: 3.57 MIL/uL — ABNORMAL LOW (ref 3.87–5.11)
RDW: 15.2 % (ref 11.5–15.5)
WBC: 9.7 10*3/uL (ref 4.0–10.5)

## 2014-08-30 LAB — GLUCOSE, CAPILLARY
GLUCOSE-CAPILLARY: 106 mg/dL — AB (ref 65–99)
GLUCOSE-CAPILLARY: 201 mg/dL — AB (ref 65–99)
Glucose-Capillary: 214 mg/dL — ABNORMAL HIGH (ref 65–99)
Glucose-Capillary: 213 mg/dL — ABNORMAL HIGH (ref 65–99)
Glucose-Capillary: 158 mg/dL — ABNORMAL HIGH (ref 65–99)
Glucose-Capillary: 174 mg/dL — ABNORMAL HIGH (ref 65–99)
Glucose-Capillary: 154 mg/dL — ABNORMAL HIGH (ref 65–99)

## 2014-08-30 NOTE — Progress Notes (Signed)
Urology Progress Note  1 Day Post-Op   Subjective: TURBT and JJ stent.      No acute urologic events overnight. Ambulation:   negative Flatus:    positive Bowel movement  negative  Pain: some relief. No fentanyl patch because of elevated Cr.   Objective:  Blood pressure 149/65, pulse 70, temperature 98.1 F (36.7 C), temperature source Oral, resp. rate 15, height 5' 8.5" (1.74 m), weight 90.8 kg (200 lb 2.8 oz), SpO2 100 %.  Physical Exam:  General:  No acute distress, awake  Genitourinary:  nml Foley: clearing with CBI    I/O last 3 completed shifts: In: 8638 [I.V.:1735; Other:5100; IV Piggyback:300] Out: 5195 [TRRNH:6579]  Recent Labs     08/29/14  1630  08/30/14  0500  HGB  9.1*  8.4*  WBC  9.8  9.7  PLT  320  286    Recent Labs     08/29/14  1630  08/30/14  0500  NA  140  138  K  4.3  4.2  CL  104  103  CO2  28  28  BUN  29*  27*  CREATININE  2.17*  2.04*  CALCIUM  8.7*  8.4*  GFRNONAA  21*  22*  GFRAA  24*  26*     No results for input(s): INR, APTT in the last 72 hours.  Invalid input(s): PT   Invalid input(s): ABG  Assessment/Plan:  Continue any current medications. CBI d/c ? D/c today

## 2014-08-30 NOTE — Care Management Note (Signed)
Case Management Note  Patient Details  Name: Joyce Terry MRN: [id number] Date of Birth: 1935-06-18  Subjective/Objective:  79 y/o f admitted w/Bladder Ca.POD#1 TURBT, JJ stent.                  Action/Plan:PT recc HHPT.Patient pleasantly decline HHC.SHe says "she will be fine"   Expected Discharge Date:                  Expected Discharge Plan:  Home/Self Care (Patient pleasantly decline HHPT.)  In-House Referral:     Discharge planning Services  CM Consult  Post Acute Care Choice:    Choice offered to:     DME Arranged:    DME Agency:     HH Arranged:    [place] Agency:     Status of Service:  In process, will continue to follow  Medicare Important Message Given:    Date Medicare IM Given:    Medicare IM give by:    Date Additional Medicare IM Given:    Additional Medicare Important Message give by:     If discussed at [place] of Stay Meetings, dates discussed:    Additional Comments:  Dessa Phi, RN 08/30/2014, 3:34 PM

## 2014-08-30 NOTE — Evaluation (Signed)
Physical Therapy Evaluation Patient Details Name: Joyce Terry MRN: [id number] DOB: December 06, 1935 Today's Date: 08/30/2014   History of Present Illness  Pt is a 79 year old female s/p TURBT and JJ stent due to bladder tumor involving right lateral wall and trigone and right hydronephrosis with hx of CHF, chronic back pain, pacemaker, arthritis, DM II   Clinical Impression  Pt admitted with above diagnosis. Pt currently with functional limitations due to the deficits listed below (see PT Problem List).  Pt will benefit from skilled PT to increase their independence and safety with mobility to allow discharge to the venue listed below.  Pt slightly unsteady with ambulation in hallway using SPC and IV pole for support.  Recommended pt use RW upon d/c if remains unsteady and feeling weak.  Pt declines HHPT and reports she will have assist to get home and then intermittently thereafter.  Will continue to work with pt if remains in acute.     Follow Up Recommendations Home health PT;Supervision for mobility/OOB (pt declines HHPT)    Equipment Recommendations  None recommended by PT    Recommendations for Other Services       Precautions / Restrictions Precautions Precautions: Fall      Mobility  Bed Mobility Overal bed mobility: Needs Assistance Bed Mobility: Rolling;Sidelying to Sit Rolling: Supervision Sidelying to sit: Supervision       General bed mobility comments: no physical assist required  Transfers Overall transfer level: Needs assistance   Transfers: Sit to/from Stand;Stand Pivot Transfers Sit to Stand: Min guard Stand pivot transfers: Min guard       General transfer comment: min/guard for safety  Ambulation/Gait Ambulation/Gait assistance: Min guard Ambulation Distance (Feet): 120 Feet Assistive device: Straight cane (and IV pole) Gait Pattern/deviations: Step-through pattern;Decreased stride length;Trunk flexed Gait velocity: decr   General Gait Details:  reaching for objects with free hand so utilized IV pole for more support today (pt declined RW), pt encouraged to use RW upon d/c if remains unsteady, pt reports weakness  Stairs            Wheelchair Mobility    Modified Rankin (Stroke Patients Only)       Balance                                             Pertinent Vitals/Pain Pain Assessment: No/denies pain    Home Living Family/patient expects to be discharged to:: Private residence Living Arrangements: Alone Available Help at Discharge: Family;Available PRN/intermittently Type of Home: House Home Access: Stairs to enter Entrance Stairs-Rails: Can reach both Entrance Stairs-Number of Steps: 4 Home Layout: One level Home Equipment: Walker - 2 wheels;Cane - single point      Prior Function Level of Independence: Independent with assistive device(s)         Comments: typically independent however occasionally uses SPC in community per pt     Hand Dominance        Extremity/Trunk Assessment               Lower Extremity Assessment: Generalized weakness         Communication   Communication: No difficulties  Cognition Arousal/Alertness: Awake/alert Behavior During Therapy: WFL for tasks assessed/performed Overall Cognitive Status: Within Functional Limits for tasks assessed  General Comments      Exercises        Assessment/Plan    PT Assessment Patient needs continued PT services  PT Diagnosis Difficulty walking;Generalized weakness   PT Problem List Decreased strength;Decreased activity tolerance;Decreased mobility;Decreased knowledge of use of DME  PT Treatment Interventions DME instruction;Gait training;Patient/family education;Functional mobility training;Therapeutic activities;Therapeutic exercise   PT Goals (Current goals can be found in the Care Plan section) Acute Rehab PT Goals PT Goal Formulation: With patient Time For Goal  Achievement: 09/06/14 Potential to Achieve Goals: Good    Frequency Min 3X/week   Barriers to discharge        Co-evaluation               End of Session   Activity Tolerance: Patient tolerated treatment well Patient left: in chair;with call bell/phone within reach;with chair alarm set Nurse Communication: Mobility status    Functional Assessment Tool Used: clinical judgement Functional Limitation: Mobility: Walking and moving around Mobility: Walking and Moving Around Current Status (Y0511): At least 1 percent but less than 20 percent impaired, limited or restricted Mobility: Walking and Moving Around Goal Status [phone]): 0 percent impaired, limited or restricted    Time: 1002-1017 PT Time Calculation (min) (ACUTE ONLY): 15 min   Charges:   PT Evaluation $Initial PT Evaluation Tier I: 1 Procedure     PT G Codes:   PT G-Codes **NOT FOR INPATIENT CLASS** Functional Assessment Tool Used: clinical judgement Functional Limitation: Mobility: Walking and moving around Mobility: Walking and Moving Around Current Status (N3567): At least 1 percent but less than 20 percent impaired, limited or restricted Mobility: Walking and Moving Around Goal Status [phone]): 0 percent impaired, limited or restricted    Jonessa Triplett,KATHrine E 08/30/2014, 11:38 AM Carmelia Bake, PT, DPT 08/30/2014 Pager: [phone]

## 2014-08-31 DIAGNOSIS — C672 Malignant neoplasm of lateral wall of bladder: Secondary | ICD-10-CM | POA: Diagnosis not present

## 2014-08-31 LAB — GLUCOSE, CAPILLARY
GLUCOSE-CAPILLARY: 153 mg/dL — AB (ref 65–99)
GLUCOSE-CAPILLARY: 200 mg/dL — AB (ref 65–99)
GLUCOSE-CAPILLARY: 231 mg/dL — AB (ref 65–99)
Glucose-Capillary: 176 mg/dL — ABNORMAL HIGH (ref 65–99)

## 2014-08-31 MED ORDER — TRIMETHOPRIM 100 MG PO TABS: 100.0000 mg | ORAL_TABLET | ORAL | Status: DC

## 2014-08-31 MED ORDER — OXYCODONE-ACETAMINOPHEN 5-325 MG PO TABS: 1.0000 | ORAL_TABLET | ORAL | Status: DC | PRN

## 2014-08-31 MED ORDER — PHENAZOPYRIDINE HCL 200 MG PO TABS
200.0000 mg | ORAL_TABLET | Freq: Three times a day (TID) | ORAL | Status: DC | PRN
Start: 2014-08-31 — End: 2015-01-26

## 2014-08-31 NOTE — Progress Notes (Signed)
Urology Progress Note  2 Days Post-Op   Subjective:  Af, VSS. Pt desires d/c, but does not want catheter at home. Urine clear this AM.     No acute urologic events overnight. Ambulation:   negative Flatus:    positive Bowel movement  positive  Pain: complete resolution  Objective:  Blood pressure 151/65, pulse 73, temperature 98.4 F (36.9 C), temperature source Oral, resp. rate 17, height 5' 8.5" (1.74 m), weight 90.8 kg (200 lb 2.8 oz), SpO2 95 %.  Physical Exam:  General:  No acute distress, awake  Genitourinary:  neg Foley: clear urine    I/O last 3 completed shifts: In: 8110 [P.O.:840; I.V.:1800; Other:3000; IV Piggyback:200] Out: 8695 [Urine:8695]  Recent Labs     08/29/14  1630  08/30/14  0500  HGB  9.1*  8.4*  WBC  9.8  9.7  PLT  320  286    Recent Labs     08/29/14  1630  08/30/14  0500  NA  140  138  K  4.3  4.2  CL  104  103  CO2  28  28  BUN  29*  27*  CREATININE  2.17*  2.04*  CALCIUM  8.7*  8.4*  GFRNONAA  21*  22*  GFRAA  24*  26*     No results for input(s): INR, APTT in the last 72 hours.  Invalid input(s): PT   Invalid input(s): ABG  Assessment/Plan:  Catheter removed. D/c after s[pontaneous void today RTC Dr. Louis Meckel  D/c Cr=2.04 ( resolving Acute on chronic renal failure).

## 2014-08-31 NOTE — Discharge Instructions (Signed)
Transurethral Resection of Bladder Tumor (TURBT) or Bladder Biopsy   Definition:  Transurethral Resection of the Bladder Tumor is a surgical procedure used to diagnose and remove tumors within the bladder. TURBT is the most common treatment for early stage bladder cancer.  General instructions:     Your recent bladder surgery requires very little post hospital care but some definite precautions.  Despite the fact that no skin incisions were used, the area around the bladder incisions are raw and covered with scabs to promote healing and prevent bleeding. Certain precautions are needed to insure that the scabs are not disturbed over the next 2-4 weeks while the healing proceeds.  Because the raw surface inside your bladder and the irritating effects of urine you may expect frequency of urination and/or urgency (a stronger desire to urinate) and perhaps even getting up at night more often. This will usually resolve or improve slowly over the healing period. You may see some blood in your urine over the first 6 weeks. Do not be alarmed, even if the urine was clear for a while. Get off your feet and drink lots of fluids until clearing occurs. If you start to pass clots or don't improve call us.  Diet:  You may return to your normal diet immediately. Because of the raw surface of your bladder, alcohol, spicy foods, foods high in acid and drinks with caffeine may cause irritation or frequency and should be used in moderation. To keep your urine flowing freely and avoid constipation, drink plenty of fluids during the day (8-10 glasses). Tip: Avoid cranberry juice because it is very acidic.  Activity:  Your physical activity doesn't need to be restricted. However, if you are very active, you may see some blood in the urine. We suggest that you reduce your activity under the circumstances until the bleeding has stopped.  Bowels:  It is important to keep your bowels regular during the postoperative  period. Straining with bowel movements can cause bleeding. A bowel movement every other day is reasonable. Use a mild laxative if needed, such as milk of magnesia 2-3 tablespoons, or 2 Dulcolax tablets. Call if you continue to have problems. If you had been taking narcotics for pain, before, during or after your surgery, you may be constipated. Take a laxative if necessary.    Medication:  You should resume your pre-surgery medications unless told not to. In addition you may be given an antibiotic to prevent or treat infection. Antibiotics are not always necessary. All medication should be taken as prescribed until the bottles are finished unless you are having an unusual reaction to one of the drugs.  You should resume your coumadin 7 days after her surgery and the  bleeding has stopped.  Foley Catheter Care, Adult A soft, flexible tube (Foley catheter) has been placed in your bladder. This may be done to temporarily help with urine drainage after an operation or to relieve blockage from an enlarged prostate gland. HOME CARE INSTRUCTIONS  If you are going home with a Foley catheter in place, follow these instructions: Taking Care of the Catheter:  Keep the area where the catheter leaves your body clean.  Attach the catheter to the leg so there is no tension on the catheter.  Keep the drainage bag below the level of the bladder, but keep it OFF the floor.  Do not take long soaking baths.   Wash your hands before touching ANYTHING related to the catheter or bag.  Using mild soap and  warm water on a washcloth:  Clean the area closest to the catheter insertion site using a circular motion around the catheter.  Clean the catheter itself by wiping AWAY from the insertion site for several inches down the tube.  NEVER wipe upward as this could sweep bacteria up into the urethra (tube in your body that normally drains the bladder) and cause infection. Taking Care of the Drainage Bags:  Two  drainage bags will be taken home: a large overnight drainage bag, and a smaller leg bag which fits underneath clothing.  It is okay to wear the overnight bag at any time, but NEVER wear the smaller leg bag at night.  Keep the drainage bag well below the level of your bladder. This prevents backflow of urine into the bladder and allows the urine to drain freely.  Anchor the tubing to your leg to prevent pulling or tension on the catheter. Use tape or a leg strap provided by the hospital.  Empty the drainage bag when it is  to  full. Wash your hands before and after touching the bag.  Periodically check the tubing for kinks to make sure there is no pressure on the tubing which could restrict the flow of urine. Changing the Drainage Bags:  Cleanse both ends of the clean bag with alcohol before changing.  Pinch off the rubber catheter to avoid urine spillage during the disconnection.  Disconnect the dirty bag and connect the clean one.  Empty the dirty bag carefully to avoid a urine spill.  Attach the new bag to the leg with tape or a leg strap. Cleaning the Drainage Bags:  Whenever a drainage bag is disconnected, it must be cleaned quickly so it is ready for the next use.  Wash the bag in warm, soapy water.  Rinse the bag thoroughly with warm water.  Soak the bag for 30 minutes in a solution of white vinegar and water (1 cup vinegar to 1 quart warm water).  Rinse with warm water. SEEK MEDICAL CARE IF:   Some pain develops in the kidney (lower back) area.  The urine is cloudy or smells bad.  There is some blood in the urine.  The catheter becomes clogged and/or there is no urine drainage. SEEK IMMEDIATE MEDICAL CARE IF:   You have moderate or severe pain in the kidney region.  You start to throw up (vomit).  Blood fills the tube.  Worsening belly (abdominal) pain develops.  You have a fever. MAKE SURE YOU:   Understand these instructions.  Will watch your  condition.  Will get help right away if you are not doing well or get worse.  Call [facility] if you have any questions or concerns: [phone]     Bladder Cancer Bladder cancer is an abnormal growth of tissue in your bladder. Your bladder is the balloon-like sac in your pelvis. It collects and stores urine that comes from the kidneys through the ureters. The bladder wall is made of layers. If cancer spreads into these layers and through the wall of the bladder, it becomes more difficult to treat.  There are four stages of bladder cancer:  Stage I. Cancer at this stage occurs in the bladder's inner lining but has not invaded the muscular bladder wall.  Stage II. At this stage, cancer has invaded the bladder wall but is still confined to the bladder.  Stage III. By this stage, the cancer cells have spread through the bladder wall to surrounding tissue. They may  also have spread to the prostate in men or the uterus or vagina in women.  Stage IV. By this stage, cancer cells may have spread to the lymph nodes and other organs, such as your lungs, bones, or liver. RISK FACTORS Although the cause of bladder cancer is not known, the following risk factors can increase your chances of getting bladder cancer:   Smoking.   Occupational exposures, such as rubber, leather, textile, dyes, chemicals, and paint.  Being white.  Age.   Being female.   Having chronic bladder inflammation.   Having a bladder cancer history.   Having a family history of bladder cancer (heredity).   Having had chemotherapy or radiation therapy to the pelvis.   Being exposed to arsenic.  SYMPTOMS   Blood in the urine.   Pain with urination.   Frequent bladder or urine infections.  Increase in urgency and frequency of urination. DIAGNOSIS  Your health care provider may suspect bladder cancer based on your description of urinary symptoms or based on the finding of blood or infection in  the urine (especially if this has recurred several times). Other tests or procedures that may be performed include:   A narrow tube being inserted into your bladder through your urethra (cystoscopy) in order to view the lining of your bladder for tumors.   A biopsy to sample the tumor to see if cancer is present.  If cancer is present, it will then be staged to determine its severity and extent. It is important to know how deeply into the bladder wall the cancer has grown and whether the cancer has spread to any other parts of your body. Staging may require blood tests or special scans such as a CT scan, MRI, bone scan, or chest X-ray.  TREATMENT  Once your cancer has been diagnosed and staged, you should discuss a treatment plan with your health care provider. Based on the stage of the cancer, one treatment or a combination of treatments may be recommended. The most common forms of treatment are:   Surgery. Procedures that may be done include transurethral resection and cystectomy.  Radiation therapy. This is infrequently used to treat bladder cancer.   Chemotherapy. During this treatment, drugs are used to kill cancer cells.  Immunotherapy. This is usually administered directly into the bladder. HOME CARE INSTRUCTIONS  Take medicines only as directed by your health care provider.   Maintain a healthy diet.   Consider joining a support group. This may help you learn to cope with the stress of having bladder cancer.   Seek advice to help you manage treatment side effects.   Keep all follow-up visits as directed by your health care provider.   Inform your cancer specialist if you are admitted to the hospital.  [place] IF:  There is blood in your urine.  You have symptoms of a urinary tract infection. These include:  Tiredness.  Shakiness.  Weakness.  Muscle aches.  Abdominal pain.  Frequent and intense urge to urinate (in young women).  Burning  feeling in the bladder or urethra during urination (in young women). SEEK IMMEDIATE MEDICAL CARE IF:  You are unable to urinate. Document Released: 03/17/2003 Document Revised: 07/29/2013 Document Reviewed: 09/04/2012 [facility] Patient Information 2015 [place], [place]. This information is not intended to replace advice given to you by your health care provider. Make sure you discuss any questions you have with your health care provider.

## 2014-08-31 NOTE — Progress Notes (Signed)
Foley removed per Dr verbal orders. Pt tolerated it well

## 2014-09-01 ENCOUNTER — Encounter (HOSPITAL_COMMUNITY): Payer: Self-pay | Admitting: Urology

## 2014-09-01 NOTE — Discharge Summary (Signed)
Date of admission: 08/29/2014  Date of discharge: 09/01/2014  Admission diagnosis: large bladder tumor involving the right lateral wall  Discharge diagnosis: same, hydronephrosis, acute on chronic renal failure  Secondary diagnoses:  Patient Active Problem List   Diagnosis Date Noted  . Bladder cancer 08/29/2014  . Orthostasis 04/09/2014  . Dehydration 06/23/2013  . Angina, class III 03/17/2012  . Angina decubitus 02/28/2012  . Cough due to ACE inhibitor 06/01/2011  . Sinoatrial node dysfunction   . CAD (coronary artery disease) 02/15/2011  . Dyspnea 01/26/2011  . Chronic diastolic CHF (congestive heart failure) 09/20/2010  . OBESITY, MORBID 03/03/2010  . Atrial fibrillation 03/03/2010  . PPM-Medtronic 03/03/2010  . DM 03/04/2009  . ANEMIA-IRON DEFICIENCY 03/04/2009  . OTHER AND UNSPECIFIED COAGULATION DEFECTS 03/04/2009  . GERD 03/04/2009  . PERSONAL HX COLONIC POLYPS 03/04/2009  . RECTAL BLEEDING 08/22/2007  . DYSPHAGIA 08/22/2007    History and Physical: For full details, please see admission history and physical. Briefly, Joyce Terry is a 79 y.o. year old patient with gross hematuria found to have a large apparently muscle invasive.  She presents for resection of the bladder tumor.   Hospital Course: Patient tolerated the procedure well.  She was then transferred to the floor after an uneventful PACU stay.  Her hospital course was uncomplicated.  On POD# 2  she had met discharge criteria: was eating a regular diet, was up and ambulating independently,  pain was well controlled, was voiding without a catheter, and was ready to for discharge.   Laboratory values:   Recent Labs  08/29/14 1630 08/30/14 0500  WBC 9.8 9.7  HGB 9.1* 8.4*  HCT 29.8* 28.3*    Recent Labs  08/29/14 1630 08/30/14 0500  NA 140 138  K 4.3 4.2  CL 104 103  CO2 28 28  GLUCOSE 163* 233*  BUN 29* 27*  CREATININE 2.17* 2.04*  CALCIUM 8.7* 8.4*   No results for input(s): LABPT, INR in  the last 72 hours. No results for input(s): LABURIN in the last 72 hours. Results for orders placed or performed during the hospital encounter of 06/23/13  Respiratory virus panel (routine influenza)     Status: None   Collection Time: 06/24/13 10:53 AM  Result Value Ref Range Status   Source - RVPAN NASAL SWAB  Corrected    Comment: CORRECTED ON 03/31 AT 1826: PREVIOUSLY REPORTED AS NASAL SWAB   Respiratory Syncytial Virus A NOT DETECTED  Final   Respiratory Syncytial Virus B NOT DETECTED  Final   Influenza A NOT DETECTED  Final   Influenza B NOT DETECTED  Final   Parainfluenza 1 NOT DETECTED  Final   Parainfluenza 2 NOT DETECTED  Final   Parainfluenza 3 NOT DETECTED  Final   Metapneumovirus NOT DETECTED  Final   Rhinovirus NOT DETECTED  Final   Adenovirus NOT DETECTED  Final   Influenza A H1 NOT DETECTED  Final   Influenza A H3 NOT DETECTED  Final    Comment: (NOTE)       Normal Reference Range for each Analyte: NOT DETECTED Testing performed using the Luminex xTAG Respiratory Viral Panel test kit. This test was developed and its performance characteristics determined by [facility]. It has not been cleared or approved by the [place] Food and Drug Administration. This test is used for clinical purposes. It should not be regarded as investigational or for research. This laboratory is certified under the [place] (CLIA) as  qualified to perform high complexity clinical laboratory testing. Performed at [facility]    Disposition: Home  Discharge instruction: The patient was instructed to be ambulatory but told to refrain from heavy lifting, strenuous activity, or driving.   Discharge medications:   Medication List    STOP taking these medications        nitroGLYCERIN 0.4 MG SL tablet  Commonly known as:  NITROSTAT     traMADol 50 MG tablet  Commonly known as:  ULTRAM     warfarin 5 MG tablet  Commonly known as:   COUMADIN      TAKE these medications        B-D UF III MINI PEN NEEDLES 31G X 5 MM Misc  Generic drug:  Insulin Pen Needle     calcium-vitamin D 500-200 MG-UNIT per tablet  Commonly known as:  OSCAL WITH D  Take 1 tablet by mouth 2 (two) times daily.     carvedilol 3.125 MG tablet  Commonly known as:  COREG  Take 1 tablet (3.125 mg total) by mouth 2 (two) times daily with a meal.     docusate sodium 100 MG capsule  Commonly known as:  COLACE  Take 1 capsule (100 mg total) by mouth 2 (two) times daily as needed (take to keep stool soft.).     escitalopram 10 MG tablet  Commonly known as:  LEXAPRO  Take 10 mg by mouth daily.     ezetimibe-simvastatin 10-20 MG per tablet  Commonly known as:  VYTORIN  Take 1 tablet by mouth at bedtime.     fentaNYL 50 MCG/HR  Commonly known as:  DURAGESIC - dosed mcg/hr  Place 1 patch onto the skin every 3 (three) days.     Fluticasone-Salmeterol 250-50 MCG/DOSE Aepb  Commonly known as:  ADVAIR  Inhale 1 puff into the lungs every 12 (twelve) hours as needed. For shortness of breath     glyBURIDE-metformin 5-500 MG per tablet  Commonly known as:  GLUCOVANCE  Take 2 tablets by mouth 2 (two) times daily with a meal.     insulin aspart 100 UNIT/ML injection  Commonly known as:  novoLOG  Inject 4-8 Units into the skin 2 (two) times daily. If blood sugar>250, use 7-8 units. If if <200, use 4-5 units     insulin glargine 100 UNIT/ML injection  Commonly known as:  LANTUS  Inject 0.15 mLs (15 Units total) into the skin at bedtime.     losartan 25 MG tablet  Commonly known as:  COZAAR  take 1 tablet by mouth once daily     meclizine 25 MG tablet  Commonly known as:  ANTIVERT  Take 12.5 mg by mouth daily as needed.     OVER THE COUNTER MEDICATION  Place 1-2 drops into both eyes daily as needed. For dry/itchy eyes --saline drops     oxyCODONE-acetaminophen 10-325 MG per tablet  Commonly known as:  PERCOCET  Take 1 tablet by mouth every 4  (four) hours as needed. For pain     oxyCODONE-acetaminophen 5-325 MG per tablet  Commonly known as:  ROXICET  Take 1 tablet by mouth every 4 (four) hours as needed for severe pain.     pantoprazole 40 MG tablet  Commonly known as:  PROTONIX  Take 40 mg by mouth 2 (two) times daily.     phenazopyridine 200 MG tablet  Commonly known as:  PYRIDIUM  - Take 1 tablet (200 mg total) by mouth 3 (three)  times daily as needed for pain. For urinary burning  - Note: will stain clothing     torsemide 20 MG tablet  Commonly known as:  DEMADEX  HOLD Torsemide for 2 days. Start back with a half a tablet (10 mg) per day.  You may increase to whole table for swelling and/or shortness of breath     trimethoprim 100 MG tablet  Commonly known as:  TRIMPEX  Take 1 tablet (100 mg total) by mouth 1 day or 1 dose.     zolpidem 10 MG tablet  Commonly known as:  AMBIEN  Take 5 mg by mouth at bedtime as needed. For sleep        Followup:      Follow-up Information    Follow up with Ardis Hughs, MD On 09/12/2014.   Specialty:  Urology   Why:  3:15pm   Contact information:   [place] [address] [phone]       Schedule an appointment as soon as possible for a visit with Ardis Hughs, MD.   Specialty:  Urology   Contact information:   [place] [address] [phone]

## 2014-09-02 ENCOUNTER — Other Ambulatory Visit: Payer: Self-pay

## 2014-09-02 DIAGNOSIS — I482 Chronic atrial fibrillation, unspecified: Secondary | ICD-10-CM

## 2014-09-02 DIAGNOSIS — I495 Sick sinus syndrome: Secondary | ICD-10-CM

## 2014-09-03 ENCOUNTER — Other Ambulatory Visit: Payer: Self-pay

## 2014-09-03 DIAGNOSIS — I482 Chronic atrial fibrillation, unspecified: Secondary | ICD-10-CM

## 2014-09-03 DIAGNOSIS — I495 Sick sinus syndrome: Secondary | ICD-10-CM

## 2014-09-03 MED ORDER — TORSEMIDE 20 MG PO TABS: 20.0000 mg | ORAL_TABLET | Freq: Every day | ORAL | Status: DC | PRN

## 2014-09-03 NOTE — Telephone Encounter (Signed)
Per note 5.16.16

## 2014-09-15 ENCOUNTER — Other Ambulatory Visit: Payer: Self-pay

## 2014-09-15 MED ORDER — CARVEDILOL 3.125 MG PO TABS: 3.1250 mg | ORAL_TABLET | Freq: Two times a day (BID) | ORAL | Status: AC

## 2014-09-22 ENCOUNTER — Encounter: Payer: Self-pay | Admitting: Radiation Oncology

## 2014-09-22 NOTE — Progress Notes (Signed)
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 - MUSCULARIS PROPRIA IS PRESENT AND EXTENSIVELY INVOLVED.  Joyce Terry presented to Dr. Louis Meckel with a presentation of gross hematuria.  CT Scan demonstrated a large enhancing mass in the right posterior of the bladder wall with associated right hyronephrosis.  Past/Anticipated interventions by urology, if any: Dr. Chrystine Oiler - Transurethral Resection of the Bladder  Past/Anticipated interventions by medical oncology, if any: Unknown  Weight changes, if any:   Bowel/Bladder complaints, if any: Hematuria  Nausea/Vomiting, if any:   Pain issues, if any:    SAFETY ISSUES:  Prior radiation : Pelvis 12/07/93 - 01/10/94 [place] - 10MV- 180 cGy x 25 fractions - Total dose 4500 cGy,        Vaginal cuff implant -01/25/14 -01/25/14 -60 cGy/hr - Total dose 2031 cGy       ( Total Treatment Dose 6531cGy)   Pacemaker/ICD? Yes  Possible current pregnancy? No  Is the patient on methotrexate? No  Current Complaints / other details:    Pacemaker - Dr. Alfonzo Beers

## 2014-09-23 ENCOUNTER — Ambulatory Visit: Payer: Medicare Other

## 2014-09-23 ENCOUNTER — Ambulatory Visit
Admission: RE | Admit: 2014-09-23 | Discharge: 2014-09-23 | Disposition: A | Discharge status: Home / Self Care | Payer: Medicare Other | Source: Ambulatory Visit | Attending: Radiation Oncology | Admitting: Radiation Oncology

## 2014-09-23 HISTORY — DX: Personal history of other diseases of the digestive system: Z87.19

## 2014-09-23 HISTORY — DX: Unspecified hydronephrosis: N13.30

## 2014-09-23 HISTORY — DX: Personal history of other diseases of the circulatory system: Z86.79

## 2014-09-23 HISTORY — DX: Malignant neoplasm of urinary organ, unspecified: C68.9

## 2014-10-01 ENCOUNTER — Other Ambulatory Visit: Payer: Self-pay

## 2014-10-01 DIAGNOSIS — I495 Sick sinus syndrome: Secondary | ICD-10-CM

## 2014-10-01 DIAGNOSIS — I482 Chronic atrial fibrillation, unspecified: Secondary | ICD-10-CM

## 2014-10-01 MED ORDER — TORSEMIDE 20 MG PO TABS: 20.0000 mg | ORAL_TABLET | Freq: Every day | ORAL | Status: DC | PRN

## 2014-10-10 ENCOUNTER — Telehealth: Payer: Self-pay | Admitting: Oncology

## 2014-10-10 NOTE — Telephone Encounter (Signed)
new patient appt-s/w patient and gave np appt for 07/20 @ 10:30 w/Dr. Alen Blew Referring Dr. Louis Meckel Dx-Bladder Ca

## 2014-10-15 ENCOUNTER — Encounter: Payer: Self-pay | Admitting: Oncology

## 2014-10-15 ENCOUNTER — Ambulatory Visit (HOSPITAL_BASED_OUTPATIENT_CLINIC_OR_DEPARTMENT_OTHER): Payer: Medicare Other | Admitting: Oncology

## 2014-10-15 ENCOUNTER — Ambulatory Visit: Payer: Medicare Other

## 2014-10-15 ENCOUNTER — Other Ambulatory Visit: Payer: Medicare Other

## 2014-10-15 ENCOUNTER — Ambulatory Visit: Payer: Medicare Other | Admitting: Radiation Oncology

## 2014-10-15 VITALS — BP 119/80 | HR 74 | Temp 97.5°F | Resp 18 | Ht 68.5 in | Wt 194.5 lb

## 2014-10-15 DIAGNOSIS — R634 Abnormal weight loss: Secondary | ICD-10-CM

## 2014-10-15 DIAGNOSIS — R52 Pain, unspecified: Secondary | ICD-10-CM | POA: Diagnosis not present

## 2014-10-15 DIAGNOSIS — C679 Malignant neoplasm of bladder, unspecified: Secondary | ICD-10-CM

## 2014-10-15 NOTE — Progress Notes (Signed)
Please see consult note.  

## 2014-10-15 NOTE — Consult Note (Signed)
Reason for Referral: Bladder cancer.   HPI: 79 year old woman currently of [place] where she lived the majority of her life. She had been living independently and attends to activities of daily living up to recently. She have noticed functional decline in the last few months coinciding with hematuria. After her course of antibiotics which did not help her symptoms, she was referred to urology for an evaluation. She underwent a cystoscopy which was inconclusive but her CT scan demonstrated a large enhancing mass in the right posterior bladder wall associated with right hydronephrosis. On 08/29/2014 she underwent cystoscopy intraoperatively and transurethral resection of bladder tumor and stent placement under the care of Dr. Louis Meckel. The pathology showed urothelial carcinoma with bladder wall invasion and squamous differentiation. Patient tolerated the procedure well and no longer reports hematuria. She declined surgery given her age and comorbid conditions and she is not a candidate for radiation therapy given her previous radiation history. I was asked to comment about her presentation and treatment options. Clinically, she has been doing poorly. She continues to have increase in her back pain, pelvic pain and lower extremities. Living with a walker but with some difficulties. Her nieces are check in on her periodically and her appetite is down. She has not lost any weight last few weeks but overall lost close to 60 pounds last 2 years. Her CT scan on 08/15/2014 did not show any clear evidence of stage IV disease at that time. She does not report any headaches, blurry vision, syncope or seizures. She does not report any fevers, chills, sweats or early satiety. He does not report any chest pain, palpitation, orthopnea or leg edema. She does not report any cough, wheezing or difficulty breathing. She does not report any nausea, vomiting, abdominal pain she does report occasional constipation no diarrhea. She  does not report any hematuria or dysuria. She does report skeletal complaints going back pain and hip pain and cramps. She does not report any petechiae or bruising. Remainder review of systems unremarkable.   Past Medical History  Diagnosis Date  . Hypertension   . Atrial fibrillation   . Obesity   . Coronary artery disease     cardiac catheterization in 1997 -- Est. EF of 50%  . Fatigue   . Stress   . Chest pain   . Peptic ulcer 1985  . GERD (gastroesophageal reflux disease)   . Depression   . Sinoatrial node dysfunction   . Gastric polyps   . Colon polyps 05/19/2009    Tubular Adenomous polyps  . Diverticulosis   . Endometrial cancer 1995  . Hypercholesteremia   . DVT (deep venous thrombosis) 1960's    ?RLE (03/16/2012)  . Exertional dyspnea     "sometimes" (03/16/2012)  . Type II diabetes mellitus   . Anemia     "iron transfusions ?twice" (03/16/2012)  . Internal hemorrhoid, bleeding     "sometimes" (03/16/2012)  . Migraines     "when I was going thru menopause; none lately" (03/16/2012)  . Arthritis     "qwhere" (03/16/2012)  . Chronic back pain   . Angina     occasional, none recent  . Presence of permanent cardiac pacemaker   . CHF (congestive heart failure)     chronic systolic CHF  . Pneumonia 1991, 2012    x2  . Urothelial carcinoma 08/29/14  . Hydronephrosis, right   . History of atrial fibrillation   . Hx of cardiac arrhythmia   . Hx of esophageal reflux   :  Past Surgical History  Procedure Laterality Date  . Vesicovaginal fistula closure w/ tah  1995  . Joint replacement      LEFT HIP   02/2008  . Cholecystectomy  fall 2001  . Cardiac catheterization  02/21/96; 03/16/2012    Est. EF of 50%; "just to look" (03/16/2012)  . Coronary angioplasty with stent placement  02/15/11    "one"  . Coronary angioplasty  03/16/2012    "couldn't put stent in cause of where it is" (03/16/2012)  . Tonsillectomy  ~ 1962  . Total hip arthroplasty  02/2008     "left" (03/16/2012)  . Insert / replace / remove pacemaker  07/23/99    atrial insertion of the AV node -- [employer] single chamber pacemaker   . Insert / replace / remove pacemaker  2012    "replaced" (03/16/2012)  . Ptca  02/2012    Balloon angioplast of jailed diagonal branch by prior LAD stent  . Percutaneous coronary stent intervention (pci-s) N/A 02/15/2011    Procedure: PERCUTANEOUS CORONARY STENT INTERVENTION (PCI-S);  Surgeon: Burnell Blanks, MD;  Location: [facility] CATH LAB;  Service: Cardiovascular;  Laterality: N/A;  . Pacemaker generator change N/A 03/02/2011    Procedure: PACEMAKER GENERATOR CHANGE;  Surgeon: Evans Lance, MD;  Location: [facility] CATH LAB;  Service: Cardiovascular;  Laterality: N/A;  . Left heart catheterization with coronary angiogram N/A 03/16/2012    Procedure: LEFT HEART CATHETERIZATION WITH CORONARY ANGIOGRAM;  Surgeon: Thayer Headings, MD;  Location: [facility] CATH LAB;  Service: Cardiovascular;  Laterality: N/A;  . Percutaneous coronary stent intervention (pci-s) N/A 03/16/2012    Procedure: PERCUTANEOUS CORONARY STENT INTERVENTION (PCI-S);  Surgeon: Burnell Blanks, MD;  Location: [facility] CATH LAB;  Service: Cardiovascular;  Laterality: N/A;  . Abdominal hysterectomy  1995    "endometrial cancer" (03/16/2012) total   . Dilation and curettage of uterus  late 1950's    "had probably 5; several miscarriages when I was younger"  . Transurethral resection of bladder tumor with gyrus (turbt-gyrus) N/A 08/29/2014    Procedure: TRANSURETHRAL RESECTION OF BLADDER TUMOR WITH GYRUS (TURBT-GYRUS);  Surgeon: Ardis Hughs, MD;  Location: [facility] ORS;  Service: Urology;  Laterality: N/A;  . Cystoscopy w/ ureteral stent placement Bilateral 08/29/2014    Procedure: BILATERAL RETROGRADE PYELOGRAM /RIGHT URETERAL STENT PLACEMENT;  Surgeon: Ardis Hughs, MD;  Location: [facility] ORS;  Service: Urology;  Laterality: Bilateral;  :   Current outpatient prescriptions:  .  B-D UF III MINI  PEN NEEDLES 31G X 5 MM MISC, , Disp: , Rfl: 0 .  calcium-vitamin D (OSCAL WITH D) 500-200 MG-UNIT per tablet, Take 1 tablet by mouth 2 (two) times daily. (Patient taking differently: Take 1 tablet by mouth daily with breakfast. ), Disp: , Rfl:  .  carvedilol (COREG) 3.125 MG tablet, Take 1 tablet (3.125 mg total) by mouth 2 (two) times daily with a meal., Disp: 60 tablet, Rfl: 11 .  docusate sodium (COLACE) 100 MG capsule, Take 1 capsule (100 mg total) by mouth 2 (two) times daily as needed (take to keep stool soft.)., Disp: 60 capsule, Rfl: 0 .  escitalopram (LEXAPRO) 10 MG tablet, Take 10 mg by mouth daily.  , Disp: , Rfl:  .  ezetimibe-simvastatin (VYTORIN) 10-20 MG per tablet, Take 1 tablet by mouth at bedtime. , Disp: , Rfl:  .  fentaNYL (DURAGESIC - DOSED MCG/HR) 50 MCG/HR, Place 1 patch onto the skin every 3 (three) days., Disp: , Rfl:  .  Fluticasone-Salmeterol (  ADVAIR) 250-50 MCG/DOSE AEPB, Inhale 1 puff into the lungs every 12 (twelve) hours as needed. For shortness of breath , Disp: , Rfl:  .  glyBURIDE-metformin (GLUCOVANCE) 5-500 MG per tablet, Take 2 tablets by mouth 2 (two) times daily with a meal. (Patient taking differently: Take 1 tablet by mouth 2 (two) times daily with a meal. ), Disp: , Rfl:  .  insulin aspart (NOVOLOG) 100 UNIT/ML injection, Inject 4-8 Units into the skin 2 (two) times daily. If blood sugar>250, use 7-8 units. If if <200, use 4-5 units , Disp: , Rfl:  .  insulin glargine (LANTUS) 100 UNIT/ML injection, Inject 0.15 mLs (15 Units total) into the skin at bedtime. (Patient taking differently: Inject 10-20 Units into the skin at bedtime. Sliding scale), Disp: 10 mL, Rfl: 11 .  losartan (COZAAR) 25 MG tablet, take 1 tablet by mouth once daily, Disp: 30 tablet, Rfl: 10 .  meclizine (ANTIVERT) 25 MG tablet, Take 12.5 mg by mouth daily as needed. , Disp: , Rfl:  .  OVER THE COUNTER MEDICATION, Place 1-2 drops into both eyes daily as needed. For dry/itchy eyes --saline drops  , Disp: , Rfl:  .  oxyCODONE-acetaminophen (PERCOCET) 10-325 MG per tablet, Take 1 tablet by mouth every 4 (four) hours as needed. For pain, Disp: 30 tablet, Rfl: 0 .  oxyCODONE-acetaminophen (ROXICET) 5-325 MG per tablet, Take 1 tablet by mouth every 4 (four) hours as needed for severe pain., Disp: 30 tablet, Rfl: 0 .  pantoprazole (PROTONIX) 40 MG tablet, Take 40 mg by mouth 2 (two) times daily. , Disp: , Rfl:  .  phenazopyridine (PYRIDIUM) 200 MG tablet, Take 1 tablet (200 mg total) by mouth 3 (three) times daily as needed for pain. For urinary burning Note: will stain clothing, Disp: 30 tablet, Rfl: 3 .  torsemide (DEMADEX) 20 MG tablet, Take 1 tablet (20 mg total) by mouth daily as needed (Swelling)., Disp: 30 tablet, Rfl: 5 .  trimethoprim (TRIMPEX) 100 MG tablet, Take 1 tablet (100 mg total) by mouth 1 day or 1 dose., Disp: 30 tablet, Rfl: 1 .  zolpidem (AMBIEN) 10 MG tablet, Take 5 mg by mouth at bedtime as needed. For sleep, Disp: , Rfl: :  Allergies  Allergen Reactions  . Demerol Nausea And Vomiting  . Penicillins Hives  . Tape Other (See Comments)    "surgical tape tears skin up; paper tape is ok"  . Morphine And Related Itching  . Codeine Nausea And Vomiting  :  Family History  Problem Relation Age of Onset  . Heart disease Mother   . Hypertension Mother   :  History   Social History  . Marital Status: Divorced    Spouse Name: N/A  . Number of Children: N/A  . Years of Education: N/A   Occupational History  . Retired    Social History Main Topics  . Smoking status: Former Smoker -- 1.00 packs/day for 35 years    Types: Cigarettes    Start date: 08/26/1989  . Smokeless tobacco: Never Used     Comment: quit smoking "11/1989"  . Alcohol Use: 0.0 oz/week    0 Standard drinks or equivalent per week     Comment: "glass of wine q once in awhile"  . Drug Use: No  . Sexual Activity: No   Other Topics Concern  . Not on file   Social History Narrative   :  Pertinent items are noted in HPI.  Exam: ECOG 2 Blood pressure  119/80, pulse 74, temperature 97.5 F (36.4 C), temperature source Oral, resp. rate 18, height 5' 8.5" (1.74 m), weight 194 lb 8 oz (88.225 kg), SpO2 95 %. General appearance: alert and cooperative Head: Normocephalic, without obvious abnormality Throat: lips, mucosa, and tongue normal; teeth and gums normal Neck: no adenopathy Back: negative Resp: clear to auscultation bilaterally Chest wall: no tenderness Cardio: regular rate and rhythm, S1, S2 normal, no murmur, click, rub or gallop GI: soft, non-tender; bowel sounds normal; no masses,  no organomegaly Extremities: extremities normal, atraumatic, no cyanosis or edema Pulses: 2+ and symmetric Skin: Skin color, texture, turgor normal. No rashes or lesions Lymph nodes: Cervical, supraclavicular, and axillary nodes normal.  CBC    Component Value Date/Time   WBC 9.7 08/30/2014 0500   RBC 3.57* 08/30/2014 0500   HGB 8.4* 08/30/2014 0500   HCT 28.3* 08/30/2014 0500   PLT 286 08/30/2014 0500   MCV 79.3 08/30/2014 0500   MCH 23.5* 08/30/2014 0500   MCHC 29.7* 08/30/2014 0500   RDW 15.2 08/30/2014 0500   LYMPHSABS 0.7 06/23/2013 1426   MONOABS 0.6 06/23/2013 1426   EOSABS 0.0 06/23/2013 1426   BASOSABS 0.0 06/23/2013 1426      Chemistry      Component Value Date/Time   NA 138 08/30/2014 0500   K 4.2 08/30/2014 0500   CL 103 08/30/2014 0500   CO2 28 08/30/2014 0500   BUN 27* 08/30/2014 0500   CREATININE 2.04* 08/30/2014 0500      Component Value Date/Time   CALCIUM 8.4* 08/30/2014 0500   ALKPHOS 70 06/23/2013 1426   AST 19 06/23/2013 1426   ALT 12 06/23/2013 1426   BILITOT 0.3 06/23/2013 1426      Assessment and Plan:    79 year old woman with the following issues:  1. Transitional cell carcinoma of the bladder presented with hematuria and a large right posterior bladder wall tumor. She is status post transurethral resection of the bladder  tumor which confirmed the presence of muscle wall invasion and squamous differentiation. The natural course of this disease was discussed today including treatment options. The ideal treatment scenario would include any new adjuvant systemic chemotherapy with cisplatin-based regimen followed by cystectomy. Unfortunately, she is really not a candidate for cisplatin or cystectomy given her age and comorbid conditions.  Alternatively, bladder conserving approach with radiation therapy is also not an option given her previous radiation treatments.  The role of systemic chemotherapy is unclear in this situation. She does not have metastatic disease at least by her last CT scan which makes the role of systemic chemotherapy at relevant. She also have declined the concept of receiving chemotherapy in any case. I also do not think she is a great candidate for platinum agent given her chronic renal insufficiency.  My recommendation at this point is to perceived with supportive care only and symptom management. Repeat cystoscopies and fulguration of her tumor to be required in the future if she develops local recurrence.  If she develops systemic disease, we can consider palliative chemotherapy at that time although she have declined it and today's encounter.  I discussed with her also the role of hospice and supportive care only and she is interested in that concept. Given her age, multiple comorbid conditions, and the aggressive nature of her malignancy I feel that she would continue to decline and that would be the best option for her. She is going to consider this option and let me know in the near future.  2. Pain: It is unclear etiology could be contributed to by her cancer. She also has lumbar disease that is probably exacerbated by her recent surgeries. Her pain medication is adequate at this time and feels comfortable.  3. Weight loss: I have encouraged her to increase nutritional supplements and samples  were given to the patient today.  All her questions are answered today to her satisfaction.

## 2014-10-15 NOTE — Progress Notes (Signed)
Checked in new pt with no financial concerns prior to seeing the dr.  Pt has 2 insurances so financial assistance may not be needed.  ° °

## 2014-12-22 ENCOUNTER — Other Ambulatory Visit: Payer: Self-pay | Admitting: Urology

## 2014-12-22 ENCOUNTER — Ambulatory Visit (HOSPITAL_COMMUNITY)
Admission: RE | Admit: 2014-12-22 | Discharge: 2014-12-22 | Disposition: A | Discharge status: Home / Self Care | Payer: Medicare Other | Source: Ambulatory Visit | Attending: Urology | Admitting: Urology

## 2014-12-22 DIAGNOSIS — Z87891 Personal history of nicotine dependence: Secondary | ICD-10-CM | POA: Diagnosis not present

## 2014-12-22 DIAGNOSIS — Z950 Presence of cardiac pacemaker: Secondary | ICD-10-CM | POA: Insufficient documentation

## 2014-12-22 DIAGNOSIS — E119 Type 2 diabetes mellitus without complications: Secondary | ICD-10-CM | POA: Diagnosis not present

## 2014-12-22 DIAGNOSIS — R0602 Shortness of breath: Secondary | ICD-10-CM | POA: Diagnosis present

## 2014-12-22 DIAGNOSIS — I517 Cardiomegaly: Secondary | ICD-10-CM | POA: Diagnosis not present

## 2014-12-22 DIAGNOSIS — C679 Malignant neoplasm of bladder, unspecified: Secondary | ICD-10-CM | POA: Insufficient documentation

## 2014-12-22 DIAGNOSIS — I10 Essential (primary) hypertension: Secondary | ICD-10-CM | POA: Insufficient documentation

## 2014-12-22 DIAGNOSIS — R310 Gross hematuria: Secondary | ICD-10-CM

## 2015-01-19 ENCOUNTER — Emergency Department (HOSPITAL_COMMUNITY): Payer: Medicare Other

## 2015-01-19 ENCOUNTER — Encounter (HOSPITAL_COMMUNITY): Payer: Self-pay | Admitting: Emergency Medicine

## 2015-01-19 ENCOUNTER — Inpatient Hospital Stay (HOSPITAL_COMMUNITY): Payer: Medicare Other

## 2015-01-19 ENCOUNTER — Inpatient Hospital Stay (HOSPITAL_COMMUNITY)
Admission: EM | Admit: 2015-01-19 | Discharge: 2015-01-26 | DRG: 064 | Disposition: A | Discharge status: Hospice | Payer: Medicare Other | Attending: Family Medicine | Admitting: Family Medicine

## 2015-01-19 DIAGNOSIS — I160 Hypertensive urgency: Secondary | ICD-10-CM | POA: Diagnosis present

## 2015-01-19 DIAGNOSIS — I63312 Cerebral infarction due to thrombosis of left middle cerebral artery: Secondary | ICD-10-CM | POA: Diagnosis not present

## 2015-01-19 DIAGNOSIS — Z9114 Patient's other noncompliance with medication regimen: Secondary | ICD-10-CM | POA: Diagnosis not present

## 2015-01-19 DIAGNOSIS — Z955 Presence of coronary angioplasty implant and graft: Secondary | ICD-10-CM

## 2015-01-19 DIAGNOSIS — I63012 Cerebral infarction due to thrombosis of left vertebral artery: Secondary | ICD-10-CM | POA: Diagnosis not present

## 2015-01-19 DIAGNOSIS — R29810 Facial weakness: Secondary | ICD-10-CM | POA: Diagnosis present

## 2015-01-19 DIAGNOSIS — I63013 Cerebral infarction due to thrombosis of bilateral vertebral arteries: Secondary | ICD-10-CM | POA: Diagnosis not present

## 2015-01-19 DIAGNOSIS — I482 Chronic atrial fibrillation: Secondary | ICD-10-CM | POA: Diagnosis not present

## 2015-01-19 DIAGNOSIS — E669 Obesity, unspecified: Secondary | ICD-10-CM | POA: Diagnosis present

## 2015-01-19 DIAGNOSIS — Z09 Encounter for follow-up examination after completed treatment for conditions other than malignant neoplasm: Secondary | ICD-10-CM

## 2015-01-19 DIAGNOSIS — I480 Paroxysmal atrial fibrillation: Secondary | ICD-10-CM | POA: Diagnosis not present

## 2015-01-19 DIAGNOSIS — N12 Tubulo-interstitial nephritis, not specified as acute or chronic: Secondary | ICD-10-CM | POA: Diagnosis present

## 2015-01-19 DIAGNOSIS — G8191 Hemiplegia, unspecified affecting right dominant side: Secondary | ICD-10-CM | POA: Diagnosis present

## 2015-01-19 DIAGNOSIS — E875 Hyperkalemia: Secondary | ICD-10-CM | POA: Diagnosis present

## 2015-01-19 DIAGNOSIS — E872 Acidosis, unspecified: Secondary | ICD-10-CM | POA: Diagnosis present

## 2015-01-19 DIAGNOSIS — Z6828 Body mass index (BMI) 28.0-28.9, adult: Secondary | ICD-10-CM

## 2015-01-19 DIAGNOSIS — I2510 Atherosclerotic heart disease of native coronary artery without angina pectoris: Secondary | ICD-10-CM | POA: Diagnosis not present

## 2015-01-19 DIAGNOSIS — E1159 Type 2 diabetes mellitus with other circulatory complications: Secondary | ICD-10-CM | POA: Diagnosis present

## 2015-01-19 DIAGNOSIS — E1110 Type 2 diabetes mellitus with ketoacidosis without coma: Secondary | ICD-10-CM | POA: Diagnosis present

## 2015-01-19 DIAGNOSIS — Z515 Encounter for palliative care: Secondary | ICD-10-CM | POA: Insufficient documentation

## 2015-01-19 DIAGNOSIS — E1122 Type 2 diabetes mellitus with diabetic chronic kidney disease: Secondary | ICD-10-CM | POA: Diagnosis present

## 2015-01-19 DIAGNOSIS — E1310 Other specified diabetes mellitus with ketoacidosis without coma: Secondary | ICD-10-CM | POA: Diagnosis present

## 2015-01-19 DIAGNOSIS — N183 Chronic kidney disease, stage 3 (moderate): Secondary | ICD-10-CM | POA: Diagnosis present

## 2015-01-19 DIAGNOSIS — D649 Anemia, unspecified: Secondary | ICD-10-CM | POA: Insufficient documentation

## 2015-01-19 DIAGNOSIS — I481 Persistent atrial fibrillation: Secondary | ICD-10-CM | POA: Diagnosis not present

## 2015-01-19 DIAGNOSIS — I5032 Chronic diastolic (congestive) heart failure: Secondary | ICD-10-CM | POA: Diagnosis present

## 2015-01-19 DIAGNOSIS — L8990 Pressure ulcer of unspecified site, unspecified stage: Secondary | ICD-10-CM | POA: Insufficient documentation

## 2015-01-19 DIAGNOSIS — E7800 Pure hypercholesterolemia, unspecified: Secondary | ICD-10-CM | POA: Diagnosis present

## 2015-01-19 DIAGNOSIS — Z66 Do not resuscitate: Secondary | ICD-10-CM | POA: Diagnosis present

## 2015-01-19 DIAGNOSIS — I130 Hypertensive heart and chronic kidney disease with heart failure and stage 1 through stage 4 chronic kidney disease, or unspecified chronic kidney disease: Secondary | ICD-10-CM | POA: Diagnosis present

## 2015-01-19 DIAGNOSIS — D509 Iron deficiency anemia, unspecified: Secondary | ICD-10-CM | POA: Diagnosis present

## 2015-01-19 DIAGNOSIS — I6302 Cerebral infarction due to thrombosis of basilar artery: Secondary | ICD-10-CM | POA: Diagnosis not present

## 2015-01-19 DIAGNOSIS — Z950 Presence of cardiac pacemaker: Secondary | ICD-10-CM

## 2015-01-19 DIAGNOSIS — N390 Urinary tract infection, site not specified: Secondary | ICD-10-CM | POA: Diagnosis present

## 2015-01-19 DIAGNOSIS — I25118 Atherosclerotic heart disease of native coronary artery with other forms of angina pectoris: Secondary | ICD-10-CM | POA: Diagnosis not present

## 2015-01-19 DIAGNOSIS — I5042 Chronic combined systolic (congestive) and diastolic (congestive) heart failure: Secondary | ICD-10-CM | POA: Diagnosis present

## 2015-01-19 DIAGNOSIS — I509 Heart failure, unspecified: Secondary | ICD-10-CM | POA: Insufficient documentation

## 2015-01-19 DIAGNOSIS — R4701 Aphasia: Secondary | ICD-10-CM | POA: Diagnosis present

## 2015-01-19 DIAGNOSIS — R1310 Dysphagia, unspecified: Secondary | ICD-10-CM | POA: Diagnosis present

## 2015-01-19 DIAGNOSIS — M1990 Unspecified osteoarthritis, unspecified site: Secondary | ICD-10-CM | POA: Diagnosis present

## 2015-01-19 DIAGNOSIS — I63412 Cerebral infarction due to embolism of left middle cerebral artery: Secondary | ICD-10-CM | POA: Diagnosis not present

## 2015-01-19 DIAGNOSIS — G8190 Hemiplegia, unspecified affecting unspecified side: Secondary | ICD-10-CM | POA: Diagnosis not present

## 2015-01-19 DIAGNOSIS — E871 Hypo-osmolality and hyponatremia: Secondary | ICD-10-CM | POA: Diagnosis present

## 2015-01-19 DIAGNOSIS — M5136 Other intervertebral disc degeneration, lumbar region: Secondary | ICD-10-CM | POA: Diagnosis present

## 2015-01-19 DIAGNOSIS — I63311 Cerebral infarction due to thrombosis of right middle cerebral artery: Secondary | ICD-10-CM

## 2015-01-19 DIAGNOSIS — W19XXXA Unspecified fall, initial encounter: Secondary | ICD-10-CM

## 2015-01-19 DIAGNOSIS — N179 Acute kidney failure, unspecified: Secondary | ICD-10-CM | POA: Diagnosis not present

## 2015-01-19 DIAGNOSIS — Z87891 Personal history of nicotine dependence: Secondary | ICD-10-CM

## 2015-01-19 DIAGNOSIS — K219 Gastro-esophageal reflux disease without esophagitis: Secondary | ICD-10-CM | POA: Diagnosis present

## 2015-01-19 DIAGNOSIS — I639 Cerebral infarction, unspecified: Secondary | ICD-10-CM | POA: Diagnosis not present

## 2015-01-19 DIAGNOSIS — I63011 Cerebral infarction due to thrombosis of right vertebral artery: Secondary | ICD-10-CM | POA: Diagnosis not present

## 2015-01-19 DIAGNOSIS — Z79899 Other long term (current) drug therapy: Secondary | ICD-10-CM

## 2015-01-19 DIAGNOSIS — I4891 Unspecified atrial fibrillation: Secondary | ICD-10-CM | POA: Diagnosis present

## 2015-01-19 DIAGNOSIS — I63512 Cerebral infarction due to unspecified occlusion or stenosis of left middle cerebral artery: Secondary | ICD-10-CM

## 2015-01-19 DIAGNOSIS — F329 Major depressive disorder, single episode, unspecified: Secondary | ICD-10-CM | POA: Diagnosis present

## 2015-01-19 DIAGNOSIS — Z96642 Presence of left artificial hip joint: Secondary | ICD-10-CM | POA: Diagnosis present

## 2015-01-19 DIAGNOSIS — Z8542 Personal history of malignant neoplasm of other parts of uterus: Secondary | ICD-10-CM | POA: Diagnosis not present

## 2015-01-19 DIAGNOSIS — C679 Malignant neoplasm of bladder, unspecified: Secondary | ICD-10-CM | POA: Diagnosis present

## 2015-01-19 DIAGNOSIS — Z794 Long term (current) use of insulin: Secondary | ICD-10-CM | POA: Diagnosis not present

## 2015-01-19 DIAGNOSIS — I6789 Other cerebrovascular disease: Secondary | ICD-10-CM | POA: Diagnosis not present

## 2015-01-19 DIAGNOSIS — Z7189 Other specified counseling: Secondary | ICD-10-CM | POA: Diagnosis not present

## 2015-01-19 DIAGNOSIS — E1142 Type 2 diabetes mellitus with diabetic polyneuropathy: Secondary | ICD-10-CM | POA: Diagnosis present

## 2015-01-19 DIAGNOSIS — Z86718 Personal history of other venous thrombosis and embolism: Secondary | ICD-10-CM | POA: Diagnosis not present

## 2015-01-19 DIAGNOSIS — Z7901 Long term (current) use of anticoagulants: Secondary | ICD-10-CM | POA: Diagnosis not present

## 2015-01-19 DIAGNOSIS — J189 Pneumonia, unspecified organism: Secondary | ICD-10-CM

## 2015-01-19 DIAGNOSIS — E785 Hyperlipidemia, unspecified: Secondary | ICD-10-CM | POA: Diagnosis not present

## 2015-01-19 DIAGNOSIS — Z7984 Long term (current) use of oral hypoglycemic drugs: Secondary | ICD-10-CM | POA: Diagnosis not present

## 2015-01-19 DIAGNOSIS — Z8673 Personal history of transient ischemic attack (TIA), and cerebral infarction without residual deficits: Secondary | ICD-10-CM | POA: Diagnosis not present

## 2015-01-19 HISTORY — DX: Type 2 diabetes mellitus with ketoacidosis without coma: E11.10

## 2015-01-19 HISTORY — DX: Chronic kidney disease, stage 2 (mild): N18.2

## 2015-01-19 HISTORY — DX: Cerebral infarction, unspecified: I63.9

## 2015-01-19 LAB — URINE MICROSCOPIC-ADD ON

## 2015-01-19 LAB — GLUCOSE, CAPILLARY
GLUCOSE-CAPILLARY: 144 mg/dL — AB (ref 65–99)
GLUCOSE-CAPILLARY: 354 mg/dL — AB (ref 65–99)
GLUCOSE-CAPILLARY: 452 mg/dL — AB (ref 65–99)
GLUCOSE-CAPILLARY: 596 mg/dL — AB (ref 65–99)
Glucose-Capillary: 458 mg/dL — ABNORMAL HIGH (ref 65–99)
Glucose-Capillary: 292 mg/dL — ABNORMAL HIGH (ref 65–99)

## 2015-01-19 LAB — COMPREHENSIVE METABOLIC PANEL
ALBUMIN: 2.4 g/dL — AB (ref 3.5–5.0)
ALT: 13 U/L — AB (ref 14–54)
AST: 17 U/L (ref 15–41)
Alkaline Phosphatase: 125 U/L (ref 38–126)
Anion gap: 23 — ABNORMAL HIGH (ref 5–15)
BILIRUBIN TOTAL: 1.7 mg/dL — AB (ref 0.3–1.2)
BUN: 47 mg/dL — AB (ref 6–20)
CHLORIDE: 92 mmol/L — AB (ref 101–111)
CO2: 13 mmol/L — ABNORMAL LOW (ref 22–32)
CREATININE: 2.55 mg/dL — AB (ref 0.44–1.00)
Calcium: 9.3 mg/dL (ref 8.9–10.3)
GFR calc Af Amer: 20 mL/min — ABNORMAL LOW (ref >60)
GFR calc non Af Amer: 17 mL/min — ABNORMAL LOW (ref >60)
GLUCOSE: 794 mg/dL — AB (ref 65–99)
POTASSIUM: 5.7 mmol/L — AB (ref 3.5–5.1)
Sodium: 128 mmol/L — ABNORMAL LOW (ref 135–145)
Total Protein: 5.8 g/dL — ABNORMAL LOW (ref 6.5–8.1)

## 2015-01-19 LAB — PROTIME-INR
INR: 1.12 (ref 0.00–1.49)
Prothrombin Time: 14.6 seconds (ref 11.6–15.2)

## 2015-01-19 LAB — RAPID URINE DRUG SCREEN, HOSP PERFORMED
AMPHETAMINES: NOT DETECTED
BENZODIAZEPINES: NOT DETECTED
Barbiturates: NOT DETECTED
COCAINE: NOT DETECTED
OPIATES: NOT DETECTED
TETRAHYDROCANNABINOL: NOT DETECTED

## 2015-01-19 LAB — CBC WITH DIFFERENTIAL/PLATELET
Basophils Absolute: 0 10*3/uL (ref 0.0–0.1)
Basophils Relative: 0 %
EOS PCT: 0 %
Eosinophils Absolute: 0 10*3/uL (ref 0.0–0.7)
HEMATOCRIT: 33.8 % — AB (ref 36.0–46.0)
HEMOGLOBIN: 9.9 g/dL — AB (ref 12.0–15.0)
LYMPHS ABS: 0.5 10*3/uL — AB (ref 0.7–4.0)
Lymphocytes Relative: 2 %
MCH: 21.1 pg — AB (ref 26.0–34.0)
MCHC: 29.3 g/dL — AB (ref 30.0–36.0)
MCV: 71.9 fL — AB (ref 78.0–100.0)
MONO ABS: 1.2 10*3/uL — AB (ref 0.1–1.0)
Monocytes Relative: 5 %
NEUTROS ABS: 22.8 10*3/uL — AB (ref 1.7–7.7)
Neutrophils Relative %: 93 %
Platelets: 312 10*3/uL (ref 150–400)
RBC: 4.7 MIL/uL (ref 3.87–5.11)
RDW: 17.6 % — ABNORMAL HIGH (ref 11.5–15.5)
WBC Morphology: INCREASED
WBC: 24.5 10*3/uL — ABNORMAL HIGH (ref 4.0–10.5)

## 2015-01-19 LAB — BASIC METABOLIC PANEL
Anion gap: 17 — ABNORMAL HIGH (ref 5–15)
Anion gap: 12 (ref 5–15)
BUN: 47 mg/dL — ABNORMAL HIGH (ref 6–20)
BUN: 47 mg/dL — ABNORMAL HIGH (ref 6–20)
CALCIUM: 9 mg/dL (ref 8.9–10.3)
CHLORIDE: 98 mmol/L — AB (ref 101–111)
CHLORIDE: 101 mmol/L (ref 101–111)
CO2: 20 mmol/L — ABNORMAL LOW (ref 22–32)
CO2: 22 mmol/L (ref 22–32)
CREATININE: 2.3 mg/dL — AB (ref 0.44–1.00)
CREATININE: 2.05 mg/dL — AB (ref 0.44–1.00)
Calcium: 9 mg/dL (ref 8.9–10.3)
GFR, EST AFRICAN AMERICAN: 22 mL/min — AB (ref >60)
GFR, EST AFRICAN AMERICAN: 26 mL/min — AB (ref >60)
GFR, EST NON AFRICAN AMERICAN: 19 mL/min — AB (ref >60)
GFR, EST NON AFRICAN AMERICAN: 22 mL/min — AB (ref >60)
Glucose, Bld: 456 mg/dL — ABNORMAL HIGH (ref 65–99)
Glucose, Bld: 196 mg/dL — ABNORMAL HIGH (ref 65–99)
Potassium: 4 mmol/L (ref 3.5–5.1)
Potassium: 4.5 mmol/L (ref 3.5–5.1)
SODIUM: 135 mmol/L (ref 135–145)
Sodium: 135 mmol/L (ref 135–145)

## 2015-01-19 LAB — CBC
HCT: 32.5 % — ABNORMAL LOW (ref 36.0–46.0)
Hemoglobin: 9.8 g/dL — ABNORMAL LOW (ref 12.0–15.0)
MCH: 20.8 pg — ABNORMAL LOW (ref 26.0–34.0)
MCHC: 30.2 g/dL (ref 30.0–36.0)
MCV: 69 fL — AB (ref 78.0–100.0)
Platelets: 323 10*3/uL (ref 150–400)
RBC: 4.71 MIL/uL (ref 3.87–5.11)
RDW: 17 % — AB (ref 11.5–15.5)
WBC: 20.6 10*3/uL — AB (ref 4.0–10.5)

## 2015-01-19 LAB — CBG MONITORING, ED
Glucose-Capillary: >600 mg/dL (ref 65–99)
Glucose-Capillary: 582 mg/dL (ref 65–99)

## 2015-01-19 LAB — LIPID PANEL
CHOL/HDL RATIO: 9.1 ratio
CHOLESTEROL: 172 mg/dL (ref 0–200)
HDL: 19 mg/dL — ABNORMAL LOW (ref >40)
LDL Cholesterol: UNDETERMINED mg/dL (ref 0–99)
Triglycerides: 401 mg/dL — ABNORMAL HIGH (ref <150)
VLDL: UNDETERMINED mg/dL (ref 0–40)

## 2015-01-19 LAB — URINALYSIS, ROUTINE W REFLEX MICROSCOPIC
GLUCOSE, UA: 500 mg/dL — AB
Ketones, ur: 40 mg/dL — AB
NITRITE: NEGATIVE
PH: 5.5 (ref 5.0–8.0)
Protein, ur: 30 mg/dL — AB
SPECIFIC GRAVITY, URINE: 1.027 (ref 1.005–1.030)
Urobilinogen, UA: 0.2 mg/dL (ref 0.0–1.0)

## 2015-01-19 LAB — APTT: APTT: 28 s (ref 24–37)

## 2015-01-19 LAB — TROPONIN I: Troponin I: 0.03 ng/mL (ref <0.031)

## 2015-01-19 LAB — MRSA PCR SCREENING: MRSA by PCR: NEGATIVE

## 2015-01-19 LAB — ETHANOL: Alcohol, Ethyl (B): <5 mg/dL (ref <5)

## 2015-01-19 MED ORDER — INSULIN ASPART 100 UNIT/ML ~~LOC~~ SOLN: 4.0000 [IU] | Freq: Three times a day (TID) | SUBCUTANEOUS | Status: DC

## 2015-01-19 MED ORDER — SODIUM CHLORIDE 0.9 % IV SOLN
100.0000 mL/h | INTRAVENOUS | Status: DC
  Administered 2015-01-19: 100 mL/h via INTRAVENOUS

## 2015-01-19 MED ORDER — STROKE: EARLY STAGES OF RECOVERY BOOK
Freq: Once | Status: DC
  Filled 2015-01-19: qty 1

## 2015-01-19 MED ORDER — HEPARIN SODIUM (PORCINE) 5000 UNIT/ML IJ SOLN
5000.0000 [IU] | Freq: Three times a day (TID) | INTRAMUSCULAR | Status: DC
  Administered 2015-01-19 – 2015-01-25 (×17): 5000 [IU] via SUBCUTANEOUS
  Filled 2015-01-19 (×19): qty 1

## 2015-01-19 MED ORDER — INSULIN GLARGINE 100 UNIT/ML ~~LOC~~ SOLN: 15.0000 [IU] | Freq: Every day | SUBCUTANEOUS | Status: DC

## 2015-01-19 MED ORDER — SODIUM CHLORIDE 0.9 % IV SOLN
INTRAVENOUS | Status: DC
  Administered 2015-01-19: 22:00:00 via INTRAVENOUS

## 2015-01-19 MED ORDER — SODIUM CHLORIDE 0.9 % IV BOLUS (SEPSIS)
500.0000 mL | Freq: Once | INTRAVENOUS | Status: AC
  Administered 2015-01-19: 500 mL via INTRAVENOUS

## 2015-01-19 MED ORDER — INSULIN ASPART 100 UNIT/ML ~~LOC~~ SOLN: 0.0000 [IU] | Freq: Every day | SUBCUTANEOUS | Status: DC

## 2015-01-19 MED ORDER — SODIUM CHLORIDE 0.9 % IV SOLN
INTRAVENOUS | Status: DC
  Administered 2015-01-19: 5.4 [IU]/h via INTRAVENOUS
  Filled 2015-01-19: qty 2.5

## 2015-01-19 MED ORDER — INSULIN GLARGINE 100 UNIT/ML ~~LOC~~ SOLN
7.0000 [IU] | Freq: Every day | SUBCUTANEOUS | Status: DC
  Administered 2015-01-20 – 2015-01-22 (×4): 7 [IU] via SUBCUTANEOUS
  Filled 2015-01-19 (×5): qty 0.07

## 2015-01-19 MED ORDER — SENNOSIDES-DOCUSATE SODIUM 8.6-50 MG PO TABS
1.0000 | ORAL_TABLET | Freq: Every evening | ORAL | Status: DC | PRN
  Filled 2015-01-19: qty 1

## 2015-01-19 MED ORDER — SODIUM CHLORIDE 0.9 % IV SOLN: INTRAVENOUS | Status: DC

## 2015-01-19 MED ORDER — IOHEXOL 350 MG/ML SOLN
40.0000 mL | Freq: Once | INTRAVENOUS | Status: AC | PRN
  Administered 2015-01-19: 40 mL via INTRAVENOUS

## 2015-01-19 MED ORDER — FENTANYL 50 MCG/HR TD PT72
50.0000 ug | MEDICATED_PATCH | TRANSDERMAL | Status: DC
  Filled 2015-01-19: qty 1

## 2015-01-19 MED ORDER — INSULIN ASPART 100 UNIT/ML ~~LOC~~ SOLN
0.0000 [IU] | SUBCUTANEOUS | Status: DC
  Administered 2015-01-20: 8 [IU] via SUBCUTANEOUS
  Administered 2015-01-20 (×2): 11 [IU] via SUBCUTANEOUS
  Administered 2015-01-20: 5 [IU] via SUBCUTANEOUS
  Administered 2015-01-21: 8 [IU] via SUBCUTANEOUS
  Administered 2015-01-21 (×4): 5 [IU] via SUBCUTANEOUS
  Administered 2015-01-21: 3 [IU] via SUBCUTANEOUS
  Administered 2015-01-22: 2 [IU] via SUBCUTANEOUS
  Administered 2015-01-22: 5 [IU] via SUBCUTANEOUS
  Administered 2015-01-22: 3 [IU] via SUBCUTANEOUS
  Administered 2015-01-22 (×2): 8 [IU] via SUBCUTANEOUS
  Administered 2015-01-22: 3 [IU] via SUBCUTANEOUS
  Administered 2015-01-23: 5 [IU] via SUBCUTANEOUS
  Administered 2015-01-23 (×2): 3 [IU] via SUBCUTANEOUS

## 2015-01-19 MED ORDER — INSULIN GLARGINE 100 UNIT/ML ~~LOC~~ SOLN: 7.0000 [IU] | Freq: Every day | SUBCUTANEOUS | Status: DC

## 2015-01-19 MED ORDER — DEXTROSE-NACL 5-0.45 % IV SOLN
INTRAVENOUS | Status: DC
  Administered 2015-01-19: via INTRAVENOUS

## 2015-01-19 MED ORDER — SODIUM CHLORIDE 0.9 % IV SOLN
INTRAVENOUS | Status: DC
  Administered 2015-01-19: 5 [IU]/h via INTRAVENOUS
  Filled 2015-01-19: qty 2.5

## 2015-01-19 MED ORDER — SODIUM CHLORIDE 0.9 % IV SOLN
INTRAVENOUS | Status: DC
  Administered 2015-01-19: 50 mL/h via INTRAVENOUS
  Administered 2015-01-20 (×2): via INTRAVENOUS

## 2015-01-19 MED ORDER — SODIUM CHLORIDE 0.9 % IV SOLN
1000.0000 mL | INTRAVENOUS | Status: DC
  Administered 2015-01-19: 1000 mL via INTRAVENOUS

## 2015-01-19 MED ORDER — DEXTROSE-NACL 5-0.45 % IV SOLN: INTRAVENOUS | Status: DC

## 2015-01-19 MED ORDER — CIPROFLOXACIN IN D5W 400 MG/200ML IV SOLN
400.0000 mg | INTRAVENOUS | Status: DC
  Administered 2015-01-19 – 2015-01-21 (×3): 400 mg via INTRAVENOUS
  Filled 2015-01-19 (×4): qty 200

## 2015-01-19 MED ORDER — MOMETASONE FURO-FORMOTEROL FUM 100-5 MCG/ACT IN AERO
2.0000 | INHALATION_SPRAY | Freq: Two times a day (BID) | RESPIRATORY_TRACT | Status: DC
  Administered 2015-01-19 – 2015-01-24 (×7): 2 via RESPIRATORY_TRACT
  Filled 2015-01-19 (×2): qty 8.8

## 2015-01-19 MED ORDER — INSULIN ASPART 100 UNIT/ML ~~LOC~~ SOLN: 0.0000 [IU] | Freq: Three times a day (TID) | SUBCUTANEOUS | Status: DC

## 2015-01-19 NOTE — Progress Notes (Signed)
ANTIBIOTIC CONSULT NOTE - INITIAL  Pharmacy Consult for cipro Indication: UTI  Allergies  Allergen Reactions  . Demerol Nausea And Vomiting  . Penicillins Hives  . Tape Other (See Comments)    "surgical tape tears skin up; paper tape is ok"  . Morphine And Related Itching  . Codeine Nausea And Vomiting    Patient Measurements:    Vital Signs: Temp: 96.3 F (35.7 C) (10/24 1216) Temp Source: Rectal (10/24 1216) BP: 121/30 mmHg (10/24 1600) Pulse Rate: 70 (10/24 1556) Intake/Output from previous day:   Intake/Output from this shift:    Labs:  Recent Labs  01/19/15 1230  WBC 24.5*  HGB 9.9*  PLT 312  CREATININE 2.55*   CrCl cannot be calculated (Unknown ideal weight.). No results for input(s): VANCOTROUGH, VANCOPEAK, VANCORANDOM, GENTTROUGH, GENTPEAK, GENTRANDOM, TOBRATROUGH, TOBRAPEAK, TOBRARND, AMIKACINPEAK, AMIKACINTROU, AMIKACIN in the last 72 hours.   Microbiology: No results found for this or any previous visit (from the past 720 hour(s)).  Medical History: Past Medical History  Diagnosis Date  . Hypertension   . Atrial fibrillation ([place])   . Obesity   . Coronary artery disease     cardiac catheterization in 1997 -- Est. EF of 50%  . Fatigue   . Stress   . Chest pain   . Peptic ulcer 1985  . GERD (gastroesophageal reflux disease)   . Depression   . Sinoatrial node dysfunction ([facility])   . Gastric polyps   . Colon polyps 05/19/2009    Tubular Adenomous polyps  . Diverticulosis   . Endometrial cancer ([place]) 1995  . Hypercholesteremia   . DVT (deep venous thrombosis) ([place]) 1960's    ?RLE (03/16/2012)  . Exertional dyspnea     "sometimes" (03/16/2012)  . Type II diabetes mellitus ([place])   . Anemia     "iron transfusions ?twice" (03/16/2012)  . Internal hemorrhoid, bleeding     "sometimes" (03/16/2012)  . Migraines     "when I was going thru menopause; none lately" (03/16/2012)  . Arthritis     "qwhere" (03/16/2012)  . Chronic back pain   .  Angina     occasional, none recent  . Presence of permanent cardiac pacemaker   . CHF (congestive heart failure) ([facility])     chronic systolic CHF  . Pneumonia 1991, 2012    x2  . Urothelial carcinoma ([place]) 08/29/14  . Hydronephrosis, right   . History of atrial fibrillation   . Hx of cardiac arrhythmia   . Hx of esophageal reflux   . DKA (diabetic ketoacidoses) ([place])   . CVA (cerebral infarction)   . CKD (chronic kidney disease), stage II     Medications:  Prescriptions prior to admission  Medication Sig Dispense Refill Last Dose  . B-D UF III MINI PEN NEEDLES 31G X 5 MM MISC   0 Past Month at Unknown time  . calcium-vitamin D (OSCAL WITH D) 500-200 MG-UNIT per tablet Take 1 tablet by mouth 2 (two) times daily. (Patient taking differently: Take 1 tablet by mouth daily with breakfast. )   Past Week at Unknown time  . carvedilol (COREG) 3.125 MG tablet Take 1 tablet (3.125 mg total) by mouth 2 (two) times daily with a meal. 60 tablet 11 01/18/2015 at 6p  . docusate sodium (COLACE) 100 MG capsule Take 1 capsule (100 mg total) by mouth 2 (two) times daily as needed (take to keep stool soft.). 60 capsule 0 Past Week at Unknown time  . escitalopram (LEXAPRO) 10 MG  tablet Take 10 mg by mouth daily.     Past Week at Unknown time  . ezetimibe-simvastatin (VYTORIN) 10-20 MG per tablet Take 1 tablet by mouth at bedtime.    Past Week at Unknown time  . fentaNYL (DURAGESIC - DOSED MCG/HR) 50 MCG/HR Place 1 patch onto the skin every 3 (three) days.   01/18/2015 at Unknown time  . Fluticasone-Salmeterol (ADVAIR) 250-50 MCG/DOSE AEPB Inhale 1 puff into the lungs every 12 (twelve) hours as needed. For shortness of breath    Past Month at Unknown time  . glyBURIDE (DIABETA) 5 MG tablet Take 5 mg by mouth daily with breakfast.   01/18/2015 at Unknown time  . insulin aspart (NOVOLOG) 100 UNIT/ML injection Inject 4-8 Units into the skin 2 (two) times daily. If blood sugar>250, use 7-8 units. If if <200, use 4-5  units    Past Month at Unknown time  . insulin glargine (LANTUS) 100 UNIT/ML injection Inject 0.15 mLs (15 Units total) into the skin at bedtime. (Patient taking differently: Inject 10-20 Units into the skin at bedtime. Sliding scale) 10 mL 11 Past Week at Unknown time  . losartan (COZAAR) 25 MG tablet take 1 tablet by mouth once daily 30 tablet 10 01/18/2015 at Unknown time  . metFORMIN (GLUCOPHAGE) 500 MG tablet Take 1,000 mg by mouth 2 (two) times daily with a meal.   Past Week at Unknown time  . OVER THE COUNTER MEDICATION Place 1-2 drops into both eyes daily as needed. For dry/itchy eyes --saline drops    Past Month at Unknown time  . oxyCODONE-acetaminophen (PERCOCET) 10-325 MG per tablet Take 1 tablet by mouth every 4 (four) hours as needed. For pain 30 tablet 0 01/18/2015 at Unknown time  . pantoprazole (PROTONIX) 40 MG tablet Take 40 mg by mouth 2 (two) times daily.    Past Week at Unknown time  . phenazopyridine (PYRIDIUM) 200 MG tablet Take 1 tablet (200 mg total) by mouth 3 (three) times daily as needed for pain. For urinary burning Note: will stain clothing 30 tablet 3 01/18/2015 at Unknown time  . rOPINIRole (REQUIP) 0.25 MG tablet Take 0.25 mg by mouth daily.   01/18/2015 at Unknown time  . torsemide (DEMADEX) 20 MG tablet Take 1 tablet (20 mg total) by mouth daily as needed (Swelling). 30 tablet 5 01/18/2015 at Unknown time  . warfarin (COUMADIN) 5 MG tablet Take 5 mg by mouth daily. Pt nor family does not know the dose of coumadin.   01/15/2015 at time unknown  . zolpidem (AMBIEN) 10 MG tablet Take 5 mg by mouth at bedtime as needed. For sleep   Past Month at Unknown time  . meclizine (ANTIVERT) 25 MG tablet Take 12.5 mg by mouth daily as needed.    08-2014   Assessment: 79 y/o F found down at home by family. CBG>600. Daughter notes she has not been eating well and not taking her medications for at least 2 days.Patient has extensive PMH. patient has a bladder CA that has grown but  will not be treated.  Has what appears to be a progressing dementia as well. Head CT perfusion noted by neurology: ischemia of 1/2 of posterior MCA and infarction of the other half.  Thrombus noted in M2 branch. Outside the window for TPA and is on chronic Coumadin for h/o afib and DVT. No MRI/MRA due to pacemaker.Marland Kitchen UA: urinalysis with RBCs, WBCs and ketones.  Patient is admitted with: CVA, DKA, r/o UTI, hyperkalemia, ARF  Anticoagulation:  Coumadin PTA for h/o afib and DVT. INR 1.2. Daughter said pt has not been taking her meds for a few days.  Infectious Disease: r/o UTI. WBC 24.5. Dirty UA  Goal of Therapy:  Eradication of infection   Plan:  Cipro 400mg  IV q24h with CrCl<50. Resume Coumadin??   Aleecia Tapia S. Alford Highland, PharmD, BCPS Clinical Staff Pharmacist Pager [phone]  Eilene Ghazi Stillinger 01/19/2015,5:43 PM

## 2015-01-19 NOTE — Progress Notes (Signed)
Patient admitted from ED at ~1655 with Rn on cardiac monitor. Pt alert, not following commands, receptive and expressive aphasia. See e chart for additional data

## 2015-01-19 NOTE — Care Management Note (Addendum)
Case Management Note  Patient Details  Name: Joyce Terry MRN: [id number] Date of Birth: 1935/10/18  Subjective/Objective:          Admitted with DKA,stroke like symptoms, hx of diabetes type 2 insulin-dependent, atrial fibrillation on Coumadin, hyperlipidemia, hypertension, chronic systolic heart failure, bladder cancer ,dvt   Lives at home alone. Baseline independent with ADL's.     Action/Plan: Return to home when medically stable. CM to f/u with d/c needs.  Expected Discharge Date:                  Expected Discharge Plan:     In-House Referral:     Discharge planning Services     Post Acute Care Choice:    Choice offered to:     DME Arranged:    DME Agency:     HH Arranged:    [place] Agency:     Status of Service:     Medicare Important Message Given:    Date Medicare IM Given:    Medicare IM give by:    Date Additional Medicare IM Given:    Additional Medicare Important Message give by:     If discussed at [place] of Stay Meetings, dates discussed:    Additional Comments:   01/20/15 Per PT's evaluation, recommendation is for  CIR, CM to f/u.  Albertha Ghee (Friend) [phone], Candelaria Stagers (Niece) [phone]   Whitman Hero Jarrell, [place] [phone] 01/19/2015, 11:14 PM

## 2015-01-19 NOTE — ED Notes (Signed)
Pt lives at home alone. Baseline independent. Pt last seen normal 8pm last night. Pt was found by family this morning around 11:30. Pt found this morning naked on floor in room. CBG >600, pt incontinent this morning.

## 2015-01-19 NOTE — ED Provider Notes (Signed)
CSN: [id number]     Arrival date & time 01/19/15  1213 History   First MD Initiated Contact with Patient 01/19/15 1216     Chief Complaint  Patient presents with  . Stroke Symptoms     (Consider location/radiation/quality/duration/timing/severity/associated sxs/prior Treatment) HPI  Patient presents via EMS after being found with family members on the floor. Last seen normal was yesterday, at least 12 hours ago. Per report the patient is ambulatory, verbal at baseline, with no provided history of dementia. Per report the patient was in her usual state of health until today. EMS reports that on arrival the patient was naked, in her urine, on the floor   Past Medical History  Diagnosis Date  . Hypertension   . Atrial fibrillation ([place])   . Obesity   . Coronary artery disease     cardiac catheterization in 1997 -- Est. EF of 50%  . Fatigue   . Stress   . Chest pain   . Peptic ulcer 1985  . GERD (gastroesophageal reflux disease)   . Depression   . Sinoatrial node dysfunction (HCC)   . Gastric polyps   . Colon polyps 05/19/2009    Tubular Adenomous polyps  . Diverticulosis   . Endometrial cancer ([place]) 1995  . Hypercholesteremia   . DVT (deep venous thrombosis) ([place]) 1960's    ?RLE (03/16/2012)  . Exertional dyspnea     "sometimes" (03/16/2012)  . Type II diabetes mellitus ([place])   . Anemia     "iron transfusions ?twice" (03/16/2012)  . Internal hemorrhoid, bleeding     "sometimes" (03/16/2012)  . Migraines     "when I was going thru menopause; none lately" (03/16/2012)  . Arthritis     "qwhere" (03/16/2012)  . Chronic back pain   . Angina     occasional, none recent  . Presence of permanent cardiac pacemaker   . CHF (congestive heart failure) (HCC)     chronic systolic CHF  . Pneumonia 1991, 2012    x2  . 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 Hydronephrosis, right   . History of atrial fibrillation   . Hx of cardiac arrhythmia   . Hx of esophageal reflux     Past Surgical History  Procedure Laterality Date  . Vesicovaginal fistula closure w/ tah  1995  . Joint replacement      LEFT HIP   02/2008  . Cholecystectomy  fall 2001  . Cardiac catheterization  02/21/96; 03/16/2012    Est. EF of 50%; "just to look" (03/16/2012)  . Coronary angioplasty with stent placement  02/15/11    "one"  . Coronary angioplasty  03/16/2012    "couldn't put stent in cause of where it is" (03/16/2012)  . Tonsillectomy  ~ 1962  . Total hip arthroplasty  02/2008    "left" (03/16/2012)  . Insert / replace / remove pacemaker  07/23/99    atrial insertion of the AV node -- [employer]onic single chamber pacemaker   . Insert / replace / remove pacemaker  2012    "replaced" (03/16/2012)  . Ptca  02/2012    Balloon angioplast of jailed diagonal branch by prior LAD stent  . Percutaneous coronary stent intervention (pci-s) N/A 02/15/2011    Procedure: PERCUTANEOUS CORONARY STENT INTERVENTION (PCI-S);  Surgeon: Burnell Blanks, MD;  Location[facility] CATH LAB;  Service: Cardiovascular;  Laterality: N/A;  . Pacemaker generator change N/A 03/02/2011    Procedure: PACEMAKER GENERATOR CHANGE;  Surgeon: Evans Lance, MD;  Location: [facility]  CATH LAB;  Service: Cardiovascular;  Laterality: N/A;  . Left heart catheterization with coronary angiogram N/A 03/16/2012    Procedure: LEFT HEART CATHETERIZATION WITH CORONARY ANGIOGRAM;  Surgeon: Thayer Headings, MD;  Location: [facility] CATH LAB;  Service: Cardiovascular;  Laterality: N/A;  . Percutaneous coronary stent intervention (pci-s) N/A 03/16/2012    Procedure: PERCUTANEOUS CORONARY STENT INTERVENTION (PCI-S);  Surgeon: Burnell Blanks, MD;  Location: [facility] CATH LAB;  Service: Cardiovascular;  Laterality: N/A;  . Abdominal hysterectomy  1995    "endometrial cancer" (03/16/2012) total   . Dilation and curettage of uterus  late 1950's    "had probably 5; several miscarriages when I was younger"  . Transurethral resection of bladder tumor  with gyrus (turbt-gyrus) N/A 08/29/2014    Procedure: TRANSURETHRAL RESECTION OF BLADDER TUMOR WITH GYRUS (TURBT-GYRUS);  Surgeon: Ardis Hughs, MD;  Location: [facility] ORS;  Service: Urology;  Laterality: N/A;  . Cystoscopy w/ ureteral stent placement Bilateral 08/29/2014    Procedure: BILATERAL RETROGRADE PYELOGRAM /RIGHT URETERAL STENT PLACEMENT;  Surgeon: Ardis Hughs, MD;  Location: [facility] ORS;  Service: Urology;  Laterality: Bilateral;   Family History  Problem Relation Age of Onset  . Heart disease Mother   . Hypertension Mother    Social History  Substance Use Topics  . Smoking status: Former Smoker -- 1.00 packs/day for 35 years    Types: Cigarettes    Start date: 08/26/1989  . Smokeless tobacco: Never Used     Comment: quit smoking "11/1989"  . Alcohol Use: 0.0 oz/week    0 Standard drinks or equivalent per week     Comment: "glass of wine q once in awhile"   OB History    No data available     Review of Systems  Unable to perform ROS: Acuity of condition      Allergies  Demerol; Penicillins; Tape; Morphine and related; and Codeine  Home Medications   Prior to Admission medications   Medication Sig Start Date End Date Taking? Authorizing Provider  B-D UF III MINI PEN NEEDLES 31G X 5 MM MISC  01/30/14   Historical Provider, MD  calcium-vitamin D (OSCAL WITH D) 500-200 MG-UNIT per tablet Take 1 tablet by mouth 2 (two) times daily. Patient taking differently: Take 1 tablet by mouth daily with breakfast.  06/26/13   Velna Hatchet, MD  carvedilol (COREG) 3.125 MG tablet Take 1 tablet (3.125 mg total) by mouth 2 (two) times daily with a meal. 09/15/14   Thayer Headings, MD  docusate sodium (COLACE) 100 MG capsule Take 1 capsule (100 mg total) by mouth 2 (two) times daily as needed (take to keep stool soft.). 08/29/14   Ardis Hughs, MD  escitalopram (LEXAPRO) 10 MG tablet Take 10 mg by mouth daily.      Historical Provider, MD  ezetimibe-simvastatin (VYTORIN) 10-20 MG  per tablet Take 1 tablet by mouth at bedtime.     Historical Provider, MD  fentaNYL (DURAGESIC - DOSED MCG/HR) 50 MCG/HR Place 1 patch onto the skin every 3 (three) days.    Historical Provider, MD  Fluticasone-Salmeterol (ADVAIR) 250-50 MCG/DOSE AEPB Inhale 1 puff into the lungs every 12 (twelve) hours as needed. For shortness of breath     Historical Provider, MD  glyBURIDE-metformin (GLUCOVANCE) 5-500 MG per tablet Take 2 tablets by mouth 2 (two) times daily with a meal. Patient taking differently: Take 1 tablet by mouth 2 (two) times daily with a meal.  03/18/12   Meriel Pica,  PA-C  insulin aspart (NOVOLOG) 100 UNIT/ML injection Inject 4-8 Units into the skin 2 (two) times daily. If blood sugar>250, use 7-8 units. If if <200, use 4-5 units     Historical Provider, MD  insulin glargine (LANTUS) 100 UNIT/ML injection Inject 0.15 mLs (15 Units total) into the skin at bedtime. Patient taking differently: Inject 10-20 Units into the skin at bedtime. Sliding scale 06/26/13   Velna Hatchet, MD  losartan (COZAAR) 25 MG tablet take 1 tablet by mouth once daily 05/20/14   Evans Lance, MD  meclizine (ANTIVERT) 25 MG tablet Take 12.5 mg by mouth daily as needed.  04/08/14   Historical Provider, MD  OVER THE COUNTER MEDICATION Place 1-2 drops into both eyes daily as needed. For dry/itchy eyes --saline drops     Historical Provider, MD  oxyCODONE-acetaminophen (PERCOCET) 10-325 MG per tablet Take 1 tablet by mouth every 4 (four) hours as needed. For pain 08/29/14   Ardis Hughs, MD  oxyCODONE-acetaminophen (ROXICET) 5-325 MG per tablet Take 1 tablet by mouth every 4 (four) hours as needed for severe pain. 08/31/14   Carolan Clines, MD  pantoprazole (PROTONIX) 40 MG tablet Take 40 mg by mouth 2 (two) times daily.  03/25/13   Historical Provider, MD  phenazopyridine (PYRIDIUM) 200 MG tablet Take 1 tablet (200 mg total) by mouth 3 (three) times daily as needed for pain. For urinary burning Note: will  stain clothing 08/31/14   Carolan Clines, MD  torsemide (DEMADEX) 20 MG tablet Take 1 tablet (20 mg total) by mouth daily as needed (Swelling). 10/01/14   Thayer Headings, MD  trimethoprim (TRIMPEX) 100 MG tablet Take 1 tablet (100 mg total) by mouth 1 day or 1 dose. 08/31/14   Carolan Clines, MD  zolpidem (AMBIEN) 10 MG tablet Take 5 mg by mouth at bedtime as needed. For sleep    Historical Provider, MD   BP 113/75 mmHg  Pulse 95  Temp(Src) 96.3 F (35.7 C) (Rectal)  Resp 11  SpO2 100% Physical Exam  Constitutional: She appears well-developed and well-nourished. No distress.  Elderly, listless F, non-verbal  HENT:  Head: Normocephalic and atraumatic.  Eyes: Conjunctivae and EOM are normal.  Cardiovascular: Normal rate and regular rhythm.   Pulmonary/Chest: Effort normal and breath sounds normal. No stridor. No respiratory distress.  Abdominal: She exhibits no distension.  Musculoskeletal: She exhibits no edema.  Neurological: No cranial nerve deficit.  Right-sided facial droop, right-sided hemi-neglect, right upper extremity flaccid.  Anesthesia, intact distal lower extremity reflexes, but no following of commands, aphasia  Skin: Skin is warm and dry.  Psychiatric: Cognition and memory are impaired.  Nursing note and vitals reviewed.   ED Course  Procedures (including critical care time) Labs Review Labs Reviewed  COMPREHENSIVE METABOLIC PANEL - Abnormal; Notable for the following:    Sodium 128 (*)    Potassium 5.7 (*)    Chloride 92 (*)    CO2 13 (*)    Glucose, Bld 794 (*)    BUN 47 (*)    Creatinine, Ser 2.55 (*)    Total Protein 5.8 (*)    Albumin 2.4 (*)    ALT 13 (*)    Total Bilirubin 1.7 (*)    GFR calc non Af Amer 17 (*)    GFR calc Af Amer 20 (*)    Anion gap 23 (*)    All other components within normal limits  CBC WITH DIFFERENTIAL/PLATELET - Abnormal; Notable for the following:  WBC 24.5 (*)    Hemoglobin 9.9 (*)    HCT 33.8 (*)    MCV 71.9 (*)     MCH 21.1 (*)    MCHC 29.3 (*)    RDW 17.6 (*)    Neutro Abs 22.8 (*)    Lymphs Abs 0.5 (*)    Monocytes Absolute 1.2 (*)    All other components within normal limits  URINALYSIS, ROUTINE W REFLEX MICROSCOPIC (NOT AT [facility]) - Abnormal; Notable for the following:    Glucose, UA 500 (*)    Hgb urine dipstick LARGE (*)    Bilirubin Urine SMALL (*)    Ketones, ur 40 (*)    Protein, ur 30 (*)    Leukocytes, UA SMALL (*)    All other components within normal limits  URINE MICROSCOPIC-ADD ON - Abnormal; Notable for the following:    Squamous Epithelial / LPF FEW (*)    Bacteria, UA FEW (*)    All other components within normal limits  CBG MONITORING, ED - Abnormal; Notable for the following:    Glucose-Capillary >600 (*)    All other components within normal limits  CBG MONITORING, ED - Abnormal; Notable for the following:    Glucose-Capillary >600 (*)    All other components within normal limits  APTT  ETHANOL  TROPONIN I  PROTIME-INR  URINE RAPID DRUG SCREEN, HOSP PERFORMED    Imaging Review Ct Head Wo Contrast  01/19/2015  CLINICAL DATA:  Altered mental status beginning last night. Initial encounter. EXAM: CT HEAD WITHOUT CONTRAST TECHNIQUE: Contiguous axial images were obtained from the base of the skull through the vertex without intravenous contrast. COMPARISON:  None. FINDINGS: There is atrophy and chronic microvascular ischemic change. No evidence of acute intracranial abnormality including hemorrhage, infarct, mass lesion, mass effect, midline shift or abnormal extra-axial fluid collection is identified. No hydrocephalus or pneumocephalus. The calvarium is intact. Imaged paranasal sinuses and mastoid air cells are clear. IMPRESSION: No acute abnormality. Atrophy and chronic microvascular ischemic change. Electronically Signed   By: Inge Rise M.D.   On: 01/19/2015 13:09     After initial CT scan I reviewed the patient's imaging, discussed with her neurologist. Concern  for stroke, perfusion study pending.  Patient has a family member repair, and the family states the patient lives alone, is ambulatory, with no typical speech deficit.  Patient has previously expressed desire for no heroic interventions, including no intubation.      Ct Cerebral Perfusion W/cm  01/19/2015  CLINICAL DATA:  Speech disturbance. EXAM: CT CEREBRAL PERFUSION WITH CONTRAST TECHNIQUE: Before and during injection of intravenous contrast, repeat imaging of the brain was performed from the level of the sella nearly to the vertex and perfusion maps were generated. CONTRAST:  55mL OMNIPAQUE IOHEXOL 350 MG/ML SOLN COMPARISON:  Head CT from earlier today FINDINGS: There is asymmetrically prolonged transit time in the posterior left MCA territory cortex and associated white matter. Approximately half of this ischemic territory shows decreased cerebral blood volume consistent with infarct. On source images, the left M1 segment is symmetrically opacifying. No deep gray nuclei ischemic changes. These results were called by telephone at the time of interpretation on 01/19/2015 at 2:39 pm to [place], who verbally acknowledged these results. IMPRESSION: Ischemia throughout the posterior half left MCA territory. This ischemic area appears roughly divided between infarct and penumbra. No CTA but the left M1 is symmetrically patent. Electronically Signed   By: Monte Fantasia M.D.   On: 01/19/2015 14:46  I have personally reviewed and evaluated these images and lab results as part of my medical decision-making.   EKG Interpretation   Date/Time:  Monday January 19 2015 12:17:15 EDT Ventricular Rate:  75 PR Interval:  198 QRS Duration: 141 QT Interval:  437 QTC Calculation: 488 R Axis:   -68 Text Interpretation:  Ventricular-paced rhythm No further analysis  attempted due to paced rhythm VENTRICULAR PACED RHYTHM Abnormal ekg  Confirmed by Carmin Muskrat  MD [phone]) on 01/19/2015 12:38:03 PM        3:19 PM Perfusion study demonstrates stroke. Remainder of labs notable for evidence for diabetic ketoacidosis, with anion gap of greater than 20. Patient has received insulin via protocol, continuously.   Given evidence for substantial metabolic abnormalities, stroke, patient will be admitted to the stepdown unit. MDM    Final diagnoses:  Cerebrovascular accident (CVA) due to occlusion of left middle cerebral artery (HCC)  Diabetic ketoacidosis associated with other specified diabetes mellitus ([place])   Patient presents via EMS, half day after last normal time, now with physical exam findings concerning for stroke, with CT evidence for this entity, as well as notable metabolic abnormalities consistent with diabetic ketoacidosis. No overt signs for infection, though the patient has substantial leukocytosis, this is likely reactive. Given her substantial abnormalities, the patient was admitted for further evaluation and management, to the stepdown unit.  CRITICAL CARE Performed by: Carmin Muskrat Total critical care time: 45 Critical care time was exclusive of separately billable procedures and treating other patients. Critical care was necessary to treat or prevent imminent or life-threatening deterioration. Critical care was time spent personally by me on the following activities: development of treatment plan with patient and/or surrogate as well as nursing, discussions with consultants, evaluation of patient's response to treatment, examination of patient, obtaining history from patient or surrogate, ordering and performing treatments and interventions, ordering and review of laboratory studies, ordering and review of radiographic studies, pulse oximetry and re-evaluation of patient's condition.   Carmin Muskrat, MD 01/19/15 [id number]

## 2015-01-19 NOTE — Consult Note (Addendum)
Referring Physician: Vanita Panda    Chief Complaint: Stroke  HPI:                                                                                                                                         Joyce Terry is an 79 y.o. female with Afib on coumadin per daughter and has a pacemaker. She was last seen last night at 2000. Daughter notes she has not been eating well and not taking her medications for at least 2 days.  She was concerned about her last night due to her decreased appetite.  She tried to get her mother to come to hospital but she declined.  She was normal prior to her leaving last night.  This AM she went to check on her (patient was not answering her phone). She was found on the floor, aphasic, and not moving her right ar or leg well.   Date last known well: Date: 01/18/2015 Time last known well: Time: 20:00 tPA Given: No: out of indow  Modified Rankin: Rankin Score=0   Past Medical History  Diagnosis Date  . Hypertension   . Atrial fibrillation ([place])   . Obesity   . Coronary artery disease     cardiac catheterization in 1997 -- Est. EF of 50%  . Fatigue   . Stress   . Chest pain   . Peptic ulcer 1985  . GERD (gastroesophageal reflux disease)   . Depression   . Sinoatrial node dysfunction (HCC)   . Gastric polyps   . Colon polyps 05/19/2009    Tubular Adenomous polyps  . Diverticulosis   . Endometrial cancer ([place]) 1995  . Hypercholesteremia   . DVT (deep venous thrombosis) ([place]) 1960's    ?RLE (03/16/2012)  . Exertional dyspnea     "sometimes" (03/16/2012)  . Type II diabetes mellitus ([place])   . Anemia     "iron transfusions ?twice" (03/16/2012)  . Internal hemorrhoid, bleeding     "sometimes" (03/16/2012)  . Migraines     "when I was going thru menopause; none lately" (03/16/2012)  . Arthritis     "qwhere" (03/16/2012)  . Chronic back pain   . Angina     occasional, none recent  . Presence of permanent cardiac pacemaker   . CHF (congestive heart  failure) (HCC)     chronic systolic CHF  . Pneumonia 1991, 2012    x2  . 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 Hydronephrosis, right   . History of atrial fibrillation   . Hx of cardiac arrhythmia   . Hx of esophageal reflux   . DKA (diabetic ketoacidoses) ([place])   . CVA (cerebral infarction)   . CKD (chronic kidney disease), stage II     Past Surgical History  Procedure Laterality Date  . Vesicovaginal fistula closure w/ tah  1995  . Joint replacement      LEFT HIP  02/2008  . Cholecystectomy  fall 2001  . Cardiac catheterization  02/21/96; 03/16/2012    Est. EF of 50%; "just to look" (03/16/2012)  . Coronary angioplasty with stent placement  02/15/11    "one"  . Coronary angioplasty  03/16/2012    "couldn't put stent in cause of where it is" (03/16/2012)  . Tonsillectomy  ~ 1962  . Total hip arthroplasty  02/2008    "left" (03/16/2012)  . Insert / replace / remove pacemaker  07/23/99    atrial insertion of the AV node -- Medtronic single chamber pacemaker   . Insert / replace / remove pacemaker  2012    "replaced" (03/16/2012)  . Ptca  02/2012    Balloon angioplast of jailed diagonal branch by prior LAD stent  . Percutaneous coronary stent intervention (pci-s) N/A 02/15/2011    Procedure: PERCUTANEOUS CORONARY STENT INTERVENTION (PCI-S);  Surgeon: Burnell Blanks, MD;  Location[facility] C[facility] LAB;  Service: Cardiovascular;  Laterality: N/A;  . Pacemaker generator change N/A 03/02/2011    Procedure: PACEMAKER GENERATOR CHANGE;  Surgeon: Evans Lance, MD;  Location: [facility] CATH LAB;  Service: Cardiovascular;  Laterality: N/A;  . Left heart catheterization with coronary angiogram N/A 03/16/2012    Procedure: LEFT HEART CATHETERIZATION WITH CORONARY ANGIOGRAM;  Surgeon: Thayer Headings, MD;  Location: [facility] CATH LAB;  Service: Cardiovascular;  Laterality: N/A;  . Percutaneous coronary stent intervention (pci-s) N/A 03/16/2012    Procedure: PERCUTANEOUS CORONARY STENT INTERVENTION  (PCI-S);  Surgeon: Burnell Blanks, MD;  Location[facility] CATH LAB;  Service: Cardiovascular;  Laterality: N/A;  . Abdominal hysterectomy  1995    "endometrial cancer" (03/16/2012) total   . Dilation and curettage of uterus  late 1950's    "had probably 5; several miscarriages when I was younger"  . Transurethral resection of bladder tumor with gyrus (turbt-gyrus) N/A 08/29/2014    Procedure: TRANSURETHRAL RESECTION OF BLADDER TUMOR WITH GYRUS (TURBT-GYRUS);  Surgeon: Ardis Hughs, MD;  Location: [facility] ORS;  Service: Urology;  Laterality: N/A;  . Cystoscopy w/ ureteral stent placement Bilateral 08/29/2014    Procedure: BILATERAL RETROGRADE PYELOGRAM /RIGHT URETERAL STENT PLACEMENT;  Surgeon: Ardis Hughs, MD;  Location: [facility] ORS;  Service: Urology;  Laterality: Bilateral;    Family History  Problem Relation Age of Onset  . Heart disease Mother   . Hypertension Mother    Social History:  reports that she has quit smoking. Her smoking use included Cigarettes. She started smoking about 25 years ago. She has a 35 pack-year smoking history. She has never used smokeless tobacco. She reports that she drinks alcohol. She reports that she does not use illicit drugs.  Allergies:  Allergies  Allergen Reactions  . Demerol Nausea And Vomiting  . Penicillins Hives  . Tape Other (See Comments)    "surgical tape tears skin up; paper tape is ok"  . Morphine And Related Itching  . Codeine Nausea And Vomiting    Medications:  Current Facility-Administered Medications  Medication Dose Route Frequency Provider Last Rate Last Dose  . 0.9 %  sodium chloride infusion  100 mL/hr Intravenous Continuous Carmin Muskrat, MD   Stopped at 01/19/15 1435  . 0.9 %  sodium chloride infusion  1,000 mL Intravenous Continuous Carmin Muskrat, MD 125 mL/hr at 01/19/15 1436 1,000 mL at 01/19/15  1436  . dextrose 5 %-0.45 % sodium chloride infusion   Intravenous Continuous Carmin Muskrat, MD      . insulin regular (NOVOLIN R,HUMULIN R) 250 Units in sodium chloride 0.9 % 250 mL (1 Units/mL) infusion   Intravenous Continuous Carmin Muskrat, MD 16.2 mL/hr at 01/19/15 1642 16.2 Units/hr at 01/19/15 1642   Current Outpatient Prescriptions  Medication Sig Dispense Refill  . B-D UF III MINI PEN NEEDLES 31G X 5 MM MISC   0  . calcium-vitamin D (OSCAL WITH D) 500-200 MG-UNIT per tablet Take 1 tablet by mouth 2 (two) times daily. (Patient taking differently: Take 1 tablet by mouth daily with breakfast. )    . carvedilol (COREG) 3.125 MG tablet Take 1 tablet (3.125 mg total) by mouth 2 (two) times daily with a meal. 60 tablet 11  . docusate sodium (COLACE) 100 MG capsule Take 1 capsule (100 mg total) by mouth 2 (two) times daily as needed (take to keep stool soft.). 60 capsule 0  . escitalopram (LEXAPRO) 10 MG tablet Take 10 mg by mouth daily.      Marland Kitchen ezetimibe-simvastatin (VYTORIN) 10-20 MG per tablet Take 1 tablet by mouth at bedtime.     . fentaNYL (DURAGESIC - DOSED MCG/HR) 50 MCG/HR Place 1 patch onto the skin every 3 (three) days.    . Fluticasone-Salmeterol (ADVAIR) 250-50 MCG/DOSE AEPB Inhale 1 puff into the lungs every 12 (twelve) hours as needed. For shortness of breath     . glyBURIDE (DIABETA) 5 MG tablet Take 5 mg by mouth daily with breakfast.    . insulin aspart (NOVOLOG) 100 UNIT/ML injection Inject 4-8 Units into the skin 2 (two) times daily. If blood sugar>250, use 7-8 units. If if <200, use 4-5 units     . insulin glargine (LANTUS) 100 UNIT/ML injection Inject 0.15 mLs (15 Units total) into the skin at bedtime. (Patient taking differently: Inject 10-20 Units into the skin at bedtime. Sliding scale) 10 mL 11  . losartan (COZAAR) 25 MG tablet take 1 tablet by mouth once daily 30 tablet 10  . metFORMIN (GLUCOPHAGE) 500 MG tablet Take 1,000 mg by mouth 2 (two) times daily with a meal.     . OVER THE COUNTER MEDICATION Place 1-2 drops into both eyes daily as needed. For dry/itchy eyes --saline drops     . oxyCODONE-acetaminophen (PERCOCET) 10-325 MG per tablet Take 1 tablet by mouth every 4 (four) hours as needed. For pain 30 tablet 0  . pantoprazole (PROTONIX) 40 MG tablet Take 40 mg by mouth 2 (two) times daily.     . phenazopyridine (PYRIDIUM) 200 MG tablet Take 1 tablet (200 mg total) by mouth 3 (three) times daily as needed for pain. For urinary burning Note: will stain clothing 30 tablet 3  . rOPINIRole (REQUIP) 0.25 MG tablet Take 0.25 mg by mouth daily.    Marland Kitchen torsemide (DEMADEX) 20 MG tablet Take 1 tablet (20 mg total) by mouth daily as needed (Swelling). 30 tablet 5  . warfarin (COUMADIN) 5 MG tablet Take 5 mg by mouth daily. Pt nor family does not know the dose of coumadin.    Marland Kitchen  zolpidem (AMBIEN) 10 MG tablet Take 5 mg by mouth at bedtime as needed. For sleep    . meclizine (ANTIVERT) 25 MG tablet Take 12.5 mg by mouth daily as needed.        ROS:                                                                                                                                       History obtained from daughter  General ROS: positive for -  weight loss Psychological ROS: negative for - behavioral disorder, hallucinations, memory difficulties, mood swings or suicidal ideation Ophthalmic ROS: negative for - blurry vision, double vision, eye pain or loss of vision ENT ROS: negative for - epistaxis, nasal discharge, oral lesions, sore throat, tinnitus or vertigo Allergy and Immunology ROS: negative for - hives or itchy/watery eyes Hematological and Lymphatic ROS: negative for - bleeding problems, bruising or swollen lymph nodes Endocrine ROS: negative for - galactorrhea, hair pattern changes, polydipsia/polyuria or temperature intolerance Respiratory ROS: negative for - cough, hemoptysis, shortness of breath or wheezing Cardiovascular ROS: negative for - chest pain,  dyspnea on exertion, edema or irregular heartbeat Gastrointestinal ROS: negative for - abdominal pain, diarrhea, hematemesis, nausea/vomiting or stool incontinence Genito-Urinary ROS: negative for - dysuria, hematuria, incontinence or urinary frequency/urgency Musculoskeletal ROS: negative for - joint swelling or muscular weakness Neurological ROS: as noted in HPI Dermatological ROS: negative for rash and skin lesion changes  Neurologic Examination:                                                                                                      Blood pressure 121/30, pulse 70, temperature 96.3 F (35.7 C), temperature source Rectal, resp. rate 15, SpO2 98 %.  HEENT-  Normocephalic, no lesions, without obvious abnormality.  Normal external eye and conjunctiva.  Normal TM's bilaterally.  Normal auditory canals and external ears. Normal external nose, mucus membranes and septum.  Normal pharynx. Cardiovascular- S1, S2 normal, pulses palpable throughout   Lungs- chest clear, no wheezing, rales, normal symmetric air entry, Heart exam - S1, S2 normal, no murmur, no gallop, rate regular Abdomen- normal findings: bowel sounds normal Extremities- less then 2 second capillary refill Lymph-no adenopathy palpable Musculoskeletal-no joint tenderness, deformity or swelling Skin-warm and dry, no hyperpigmentation, vitiligo, or suspicious lesions  Neurological Examination Mental Status: Alert, aphasic both receptive and expressive, follows no verbal or visual commands. Moaning.  Cranial Nerves: II: Discs flat bilaterally; No blink to threat bilaterally, pupils equal,  round, reactive to light and accommodation III,IV, VI: ptosis not present, dolls reflex intact V,VII: right facial droop, facial sensation appears to be decreased on the right VIII: hearing normal bilaterally IX,X: unable to visualize XI: decreased strength in right shoulder XII: midline tongue extension Motor: Right : Upper  extremity   3/5    Left:     Upper extremity   5/5  Lower extremity   3/5     Lower extremity   5/5 Tone and bulk:normal tone throughout; no atrophy noted Sensory: decreased sensation in the right arm and leg Deep Tendon Reflexes: 2+ and symmetric throughout Plantars: Up going bilatereally Cerebellar: Unable to test due to aphasia Gait: not tested due to safety concerns     Lab Results: Basic Metabolic Panel:  Recent Labs Lab 01/19/15 1230  NA 128*  K 5.7*  CL 92*  CO2 13*  GLUCOSE 794*  BUN 47*  CREATININE 2.55*  CALCIUM 9.3    Liver Function Tests:  Recent Labs Lab 01/19/15 1230  AST 17  ALT 13*  ALKPHOS 125  BILITOT 1.7*  PROT 5.8*  ALBUMIN 2.4*   No results for input(s): LIPASE, AMYLASE in the last 168 hours. No results for input(s): AMMONIA in the last 168 hours.  CBC:  Recent Labs Lab 01/19/15 1230  WBC 24.5*  NEUTROABS 22.8*  HGB 9.9*  HCT 33.8*  MCV 71.9*  PLT 312    Cardiac Enzymes:  Recent Labs Lab 01/19/15 1230  TROPONINI 0.03    Lipid Panel: No results for input(s): CHOL, TRIG, HDL, CHOLHDL, VLDL, LDLCALC in the last 168 hours.  CBG:  Recent Labs Lab 01/19/15 1218 01/19/15 1432 01/19/15 1535 01/19/15 1641  GLUCAP >600* >600* 582* >600*    Microbiology: Results for orders placed or performed during the hospital encounter of 06/23/13  Respiratory virus panel (routine influenza)     Status: None   Collection Time: 06/24/13 10:53 AM  Result Value Ref Range Status   Source - RVPAN NASAL SWAB  Corrected    Comment: CORRECTED ON 03/31 AT 1826: PREVIOUSLY REPORTED AS NASAL SWAB   Respiratory Syncytial Virus A NOT DETECTED  Final   Respiratory Syncytial Virus B NOT DETECTED  Final   Influenza A NOT DETECTED  Final   Influenza B NOT DETECTED  Final   Parainfluenza 1 NOT DETECTED  Final   Parainfluenza 2 NOT DETECTED  Final   Parainfluenza 3 NOT DETECTED  Final   Metapneumovirus NOT DETECTED  Final   Rhinovirus NOT  DETECTED  Final   Adenovirus NOT DETECTED  Final   Influenza A H1 NOT DETECTED  Final   Influenza A H3 NOT DETECTED  Final    Comment: (NOTE)       Normal Reference Range for each Analyte: NOT DETECTED Testing performed using the Lumine[employer] xTAG Respiratory Viral Panel test kit. This test was developed and its performance characteristics determined by [facility]. It has not been cleared or approved by the [place] Food and Drug Administration. This test is used for clinical purposes. It should not be regarded as investigational or for research. This laboratory is certified under the [place] (CLIA) as qualified to perform high complexity clinical laboratory testing. Performed at [facility]    Coagulation Studies:  Recent Labs  01/19/15 1230  LABPROT 14.6  INR 1.12    Imaging: Ct Head Wo Contrast  01/19/2015  CLINICAL DATA:  Altered mental status beginning last night. Initial encounter.  EXAM: CT HEAD WITHOUT CONTRAST TECHNIQUE: Contiguous axial images were obtained from the base of the skull through the vertex without intravenous contrast. COMPARISON:  None. FINDINGS: There is atrophy and chronic microvascular ischemic change. No evidence of acute intracranial abnormality including hemorrhage, infarct, mass lesion, mass effect, midline shift or abnormal extra-axial fluid collection is identified. No hydrocephalus or pneumocephalus. The calvarium is intact. Imaged paranasal sinuses and mastoid air cells are clear. IMPRESSION: No acute abnormality. Atrophy and chronic microvascular ischemic change. Electronically Signed   By: Inge Rise M.D.   On: 01/19/2015 13:09   Ct Cerebral Perfusion W/cm  01/19/2015  CLINICAL DATA:  Speech disturbance. EXAM: CT CEREBRAL PERFUSION WITH CONTRAST TECHNIQUE: Before and during injection of intravenous contrast, repeat imaging of the brain was performed from the level of the sella nearly to the  vertex and perfusion maps were generated. CONTRAST:  61m OMNIPAQUE IOHEXOL 350 MG/ML SOLN COMPARISON:  Head CT from earlier today FINDINGS: There is asymmetrically prolonged transit time in the posterior left MCA territory cortex and associated white matter. Approximately half of this ischemic territory shows decreased cerebral blood volume consistent with infarct. On source images, the left M1 segment is symmetrically opacifying. No deep gray nuclei ischemic changes. These results were called by telephone at the time of interpretation on 01/19/2015 at 2:39 pm to [place] who verbally acknowledged these results. IMPRESSION: Ischemia throughout the posterior half left MCA territory. This ischemic area appears roughly divided between infarct and penumbra. No CTA but the left M1 is symmetrically patent. Electronically Signed   By: JMonte FantasiaM.D.   On: 01/19/2015 14:46     DEtta QuillPA-C [facility] [phone] 01/19/2015, 4:46 PM   Patient seen and examined.  Clinical course and management discussed.  Necessary edits performed.  I agree with the above.  Assessment and plan of care developed and discussed below.     Assessment: 79y.o. female presenting with right sided weakness. Patient's onset of symptoms last night with last known well unclear.  Patient does appear to be large vessel but is outside window for tPA.  Will consider feasibility of intervention.  Head CT personally reviewed and shows no acute changes.      Stroke Risk Factors - atrial fibrillation, diabetes mellitus, hyperlipidemia and hypertension  1. HgbA1c, fasting lipid panel 2. CT perfusion 3. PT consult, OT consult, Speech consult 4. Echocardiogram 5. Carotid dopplers 6. Prophylactic therapy-Antiplatelet med: Aspirin - dose 3075mrectally 7. NPO until RN stroke swallow screen 8. Telemetry monitoring 9. Frequent neuro checks  Addendum: CT perfusion reviewed with radiology.  Patient with ischemia of  1/2 of posterior MCA and infarction of the other half.  Thrombus noted in M2 branch.  ASPECTS score of 7.  After further conversation with family it appears that the patient has a bladder CA that has grown but will not be treated.  Has what appears to be a progressing dementia as well.  Will not have intervention.  To be admitted for stroke w/u and therapy.     LeAlexis GoodellMD [facility] [phone]/24/2016  4:58 PM

## 2015-01-19 NOTE — ED Notes (Signed)
Attempted report x1. 

## 2015-01-19 NOTE — Progress Notes (Signed)
UR COMPLETED  

## 2015-01-19 NOTE — H&P (Signed)
[facility] History and Physical  Joyce Terry QQI:[id number] DOB: 1935/11/01 DOA: 01/19/2015  Referring physician: [place] PCP: Geoffery Lyons, MD   Chief Complaint: stroke symptoms  HPI: Joyce Terry is a 79 y.o. female past medical history that includes, diabetes type 2 insulin-dependent, atrial fibrillation on Coumadin, hyperlipidemia, hypertension, chronic systolic heart failure, bladder cancer remotely was a recurrence this year, DVT presents to the emergency department with strokelike symptoms. Initial evaluation reveals DKA within an ion gap of 20 and CT cerebral perfusion scan revealing ischemia throughout the posterior half left MCA territory. This ischemic area appears roughly divided between infarct and penumbra. No CTA but the left M1 is symmetrically patent. Information is obtained from the chart and family member who is at the bedside. Reportedly she was last seen normal around 8 PM last evening. She was found this morning down at home naked in her own urine. Currently she was aphasic with marketed facial droop and not moving right leg or right arm. Daughter at bedside reports patient is independent with ADLs continues to clean and cook for herself and is ambulatory. She does report yesterday patient demonstrated decreased appetite but did not want to come to the hospital. Appointment with PCP made for 3 days from now. No report of any complaints of chest pain palpitation headache dizziness syncope or near-syncope. There is no recent fever chills nausea vomiting diarrhea. Patient is reportedly compliant with all her medications. Workup in the emergency department includes comprehensive metabolic panel revealing electrolyte derangements consistent with DKA, serum glucose 794 an ion gap 20, creatinine 2.55, complete blood count significant for WBCs 24.5 hemoglobin 9.9 MCV 71.9. Troponin negative, urinalysis with RBCs, WBCs and ketones. In the emergency department she is  provided with 500 mL of normal saline and insulin drip initiated. She was evaluated by neurology whose recommendations are pending. The time of my exam she is afebrile hemodynamically stable and not hypoxic   Review of Systems:  10 point review of systems completed with her daughter as patient unable at this time Past Medical History  Diagnosis Date  . Hypertension   . Atrial fibrillation ([place])   . Obesity   . Coronary artery disease     cardiac catheterization in 1997 -- Est. EF of 50%  . Fatigue   . Stress   . Chest pain   . Peptic ulcer 1985  . GERD (gastroesophageal reflux disease)   . Depression   . Sinoatrial node dysfunction (HCC)   . Gastric polyps   . Colon polyps 05/19/2009    Tubular Adenomous polyps  . Diverticulosis   . Endometrial cancer ([place]) 1995  . Hypercholesteremia   . DVT (deep venous thrombosis) ([place]) 1960's    ?RLE (03/16/2012)  . Exertional dyspnea     "sometimes" (03/16/2012)  . Type II diabetes mellitus ([place])   . Anemia     "iron transfusions ?twice" (03/16/2012)  . Internal hemorrhoid, bleeding     "sometimes" (03/16/2012)  . Migraines     "when I was going thru menopause; none lately" (03/16/2012)  . Arthritis     "qwhere" (03/16/2012)  . Chronic back pain   . Angina     occasional, none recent  . Presence of permanent cardiac pacemaker   . CHF (congestive heart failure) (HCC)     chronic systolic CHF  . Pneumonia 1991, 2012    x2  . 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 Hydronephrosis, right   . History of atrial fibrillation   .  Hx of cardiac arrhythmia   . Hx of esophageal reflux   . DKA (diabetic ketoacidoses) ([place])   . CVA (cerebral infarction)   . CKD (chronic kidney disease), stage II    Past Surgical History  Procedure Laterality Date  . Vesicovaginal fistula closure w/ tah  1995  . Joint replacement      LEFT HIP   02/2008  . Cholecystectomy  fall 2001  . Cardiac catheterization  02/21/96; 03/16/2012    Est. EF of 50%;  "just to look" (03/16/2012)  . Coronary angioplasty with stent placement  02/15/11    "one"  . Coronary angioplasty  03/16/2012    "couldn't put stent in cause of where it is" (03/16/2012)  . Tonsillectomy  ~ 1962  . Total hip arthroplasty  02/2008    "left" (03/16/2012)  . Insert / replace / remove pacemaker  07/23/99    atrial insertion of the AV node -- [employer]tronic single chamber pacemaker   . Insert / replace / remove pacemaker  2012    "replaced" (03/16/2012)  . Ptca  02/2012    Balloon angioplast of jailed diagonal branch by prior LAD stent  . Percutaneous coronary stent intervention (pci-s) N/A 02/15/2011    Procedure: PERCUTANEOUS CORONARY STENT INTERVENTION (PCI-S);  Surgeon: Burnell Blanks, MD;  Location: [facility] CATH LAB;  Service: Cardiovascular;  Laterality: N/A;  . Pacemaker generator change N/A 03/02/2011    Procedure: PACEMAKER GENERATOR CHANGE;  Surgeon: Evans Lance, MD;  Location: [facility] CATH LAB;  Service: Cardiovascular;  Laterality: N/A;  . Left heart catheterization with coronary angiogram N/A 03/16/2012    Procedure: LEFT HEART CATHETERIZATION WITH CORONARY ANGIOGRAM;  Surgeon: Thayer Headings, MD;  Location: [facility] CATH LAB;  Service: Cardiovascular;  Laterality: N/A;  . Percutaneous coronary stent intervention (pci-s) N/A 03/16/2012    Procedure: PERCUTANEOUS CORONARY STENT INTERVENTION (PCI-S);  Surgeon: Burnell Blanks, MD;  Location: [facility] CATH LAB;  Service: Cardiovascular;  Laterality: N/A;  . Abdominal hysterectomy  1995    "endometrial cancer" (03/16/2012) total   . Dilation and curettage of uterus  late 1950's    "had probably 5; several miscarriages when I was younger"  . Transurethral resection of bladder tumor with gyrus (turbt-gyrus) N/A 08/29/2014    Procedure: TRANSURETHRAL RESECTION OF BLADDER TUMOR WITH GYRUS (TURBT-GYRUS);  Surgeon: Ardis Hughs, MD;  Location: [facility] ORS;  Service: Urology;  Laterality: N/A;  . Cystoscopy w/ ureteral stent  placement Bilateral 08/29/2014    Procedure: BILATERAL RETROGRADE PYELOGRAM /RIGHT URETERAL STENT PLACEMENT;  Surgeon: Ardis Hughs, MD;  Location: [facility] ORS;  Service: Urology;  Laterality: Bilateral;   Social History:  reports that she has quit smoking. Her smoking use included Cigarettes. She started smoking about 25 years ago. She has a 35 pack-year smoking history. She has never used smokeless tobacco. She reports that she drinks alcohol. She reports that she does not use illicit drugs.  Allergies  Allergen Reactions  . Demerol Nausea And Vomiting  . Penicillins Hives  . Tape Other (See Comments)    "surgical tape tears skin up; paper tape is ok"  . Morphine And Related Itching  . Codeine Nausea And Vomiting    Family History  Problem Relation Age of Onset  . Heart disease Mother   . Hypertension Mother      Prior to Admission medications   Medication Sig Start Date End Date Taking? Authorizing Provider  B-D UF III MINI PEN NEEDLES 31G X 5 MM MISC  01/30/14   Historical Provider, MD  calcium-vitamin D (OSCAL WITH D) 500-200 MG-UNIT per tablet Take 1 tablet by mouth 2 (two) times daily. Patient taking differently: Take 1 tablet by mouth daily with breakfast.  06/26/13   Velna Hatchet, MD  carvedilol (COREG) 3.125 MG tablet Take 1 tablet (3.125 mg total) by mouth 2 (two) times daily with a meal. 09/15/14   Thayer Headings, MD  docusate sodium (COLACE) 100 MG capsule Take 1 capsule (100 mg total) by mouth 2 (two) times daily as needed (take to keep stool soft.). 08/29/14   Ardis Hughs, MD  escitalopram (LEXAPRO) 10 MG tablet Take 10 mg by mouth daily.      Historical Provider, MD  ezetimibe-simvastatin (VYTORIN) 10-20 MG per tablet Take 1 tablet by mouth at bedtime.     Historical Provider, MD  fentaNYL (DURAGESIC - DOSED MCG/HR) 50 MCG/HR Place 1 patch onto the skin every 3 (three) days.    Historical Provider, MD  Fluticasone-Salmeterol (ADVAIR) 250-50 MCG/DOSE AEPB Inhale 1  puff into the lungs every 12 (twelve) hours as needed. For shortness of breath     Historical Provider, MD  glyBURIDE-metformin (GLUCOVANCE) 5-500 MG per tablet Take 2 tablets by mouth 2 (two) times daily with a meal. Patient taking differently: Take 1 tablet by mouth 2 (two) times daily with a meal.  03/18/12   Roger A Arguello, PA-C  insulin aspart (NOVOLOG) 100 UNIT/ML injection Inject 4-8 Units into the skin 2 (two) times daily. If blood sugar>250, use 7-8 units. If if <200, use 4-5 units     Historical Provider, MD  insulin glargine (LANTUS) 100 UNIT/ML injection Inject 0.15 mLs (15 Units total) into the skin at bedtime. Patient taking differently: Inject 10-20 Units into the skin at bedtime. Sliding scale 06/26/13   Velna Hatchet, MD  losartan (COZAAR) 25 MG tablet take 1 tablet by mouth once daily 05/20/14   Evans Lance, MD  meclizine (ANTIVERT) 25 MG tablet Take 12.5 mg by mouth daily as needed.  04/08/14   Historical Provider, MD  OVER THE COUNTER MEDICATION Place 1-2 drops into both eyes daily as needed. For dry/itchy eyes --saline drops     Historical Provider, MD  oxyCODONE-acetaminophen (PERCOCET) 10-325 MG per tablet Take 1 tablet by mouth every 4 (four) hours as needed. For pain 08/29/14   Ardis Hughs, MD  oxyCODONE-acetaminophen (ROXICET) 5-325 MG per tablet Take 1 tablet by mouth every 4 (four) hours as needed for severe pain. 08/31/14   Carolan Clines, MD  pantoprazole (PROTONIX) 40 MG tablet Take 40 mg by mouth 2 (two) times daily.  03/25/13   Historical Provider, MD  phenazopyridine (PYRIDIUM) 200 MG tablet Take 1 tablet (200 mg total) by mouth 3 (three) times daily as needed for pain. For urinary burning Note: will stain clothing 08/31/14   Carolan Clines, MD  torsemide (DEMADEX) 20 MG tablet Take 1 tablet (20 mg total) by mouth daily as needed (Swelling). 10/01/14   Thayer Headings, MD  trimethoprim (TRIMPEX) 100 MG tablet Take 1 tablet (100 mg total) by mouth 1 day or 1  dose. 08/31/14   Carolan Clines, MD  zolpidem (AMBIEN) 10 MG tablet Take 5 mg by mouth at bedtime as needed. For sleep    Historical Provider, MD   Physical Exam: Filed Vitals:   01/19/15 1445 01/19/15 1515 01/19/15 1545 01/19/15 1556  BP: 113/75 134/44 124/95 124/95  Pulse: 95   70  Temp:  TempSrc:      Resp: 11 14 11 12   SpO2: 100% 99% 100% 99%    Wt Readings from Last 3 Encounters:  10/15/14 88.225 kg (194 lb 8 oz)  08/29/14 90.8 kg (200 lb 2.8 oz)  08/26/14 91.173 kg (201 lb)    General:  Appears calm and slightly uncomfortable Eyes: PERRL, normal lids, irises & conjunctiva ENT: grossly normal hearing, right facial droop mucous membranes of her mouth are pink slightly dry Neck: no LAD, masses or thyromegaly Cardiovascular: RRR, no m/r/g. No LE edema. Telemetry: Ventricular paced rhythm Respiratory: CTA bilaterally, no w/r/r. Normal respiratory effort. Abdomen: soft, ntnd positive bowel sounds Skin: no rash or induration seen on limited exam Musculoskeletal: grossly normal tone BUE/BLE Psychiatric: grossly normal mood and affect, speech fluent and appropriate Neurologic: Unable to follow commands markedly right facial droop spontaneous movement of left upper extremity and lower extremity only. Somewhat restless in bed basic aphasia          Labs on Admission:  Basic Metabolic Panel:  Recent Labs Lab 01/19/15 1230  NA 128*  K 5.7*  CL 92*  CO2 13*  GLUCOSE 794*  BUN 47*  CREATININE 2.55*  CALCIUM 9.3   Liver Function Tests:  Recent Labs Lab 01/19/15 1230  AST 17  ALT 13*  ALKPHOS 125  BILITOT 1.7*  PROT 5.8*  ALBUMIN 2.4*   No results for input(s): LIPASE, AMYLASE in the last 168 hours. No results for input(s): AMMONIA in the last 168 hours. CBC:  Recent Labs Lab 01/19/15 1230  WBC 24.5*  NEUTROABS 22.8*  HGB 9.9*  HCT 33.8*  MCV 71.9*  PLT 312   Cardiac Enzymes:  Recent Labs Lab 01/19/15 1230  TROPONINI 0.03    BNP (last 3  results) No results for input(s): BNP in the last 8760 hours.  ProBNP (last 3 results) No results for input(s): PROBNP in the last 8760 hours.  CBG:  Recent Labs Lab 01/19/15 1218 01/19/15 1432 01/19/15 1535  GLUCAP >600* >600* 582*    Radiological Exams on Admission: Ct Head Wo Contrast  01/19/2015  CLINICAL DATA:  Altered mental status beginning last night. Initial encounter. EXAM: CT HEAD WITHOUT CONTRAST TECHNIQUE: Contiguous axial images were obtained from the base of the skull through the vertex without intravenous contrast. COMPARISON:  None. FINDINGS: There is atrophy and chronic microvascular ischemic change. No evidence of acute intracranial abnormality including hemorrhage, infarct, mass lesion, mass effect, midline shift or abnormal extra-axial fluid collection is identified. No hydrocephalus or pneumocephalus. The calvarium is intact. Imaged paranasal sinuses and mastoid air cells are clear. IMPRESSION: No acute abnormality. Atrophy and chronic microvascular ischemic change. Electronically Signed   By: Inge Rise M.D.   On: 01/19/2015 13:09   Ct Cerebral Perfusion W/cm  01/19/2015  CLINICAL DATA:  Speech disturbance. EXAM: CT CEREBRAL PERFUSION WITH CONTRAST TECHNIQUE: Before and during injection of intravenous contrast, repeat imaging of the brain was performed from the level of the sella nearly to the vertex and perfusion maps were generated. CONTRAST:  68mL OMNIPAQUE IOHEXOL 350 MG/ML SOLN COMPARISON:  Head CT from earlier today FINDINGS: There is asymmetrically prolonged transit time in the posterior left MCA territory cortex and associated white matter. Approximately half of this ischemic territory shows decreased cerebral blood volume consistent with infarct. On source images, the left M1 segment is symmetrically opacifying. No deep gray nuclei ischemic changes. These results were called by telephone at the time of interpretation on 01/19/2015 at 2:39 pm to  PA Tamala Julian,  who verbally acknowledged these results. IMPRESSION: Ischemia throughout the posterior half left MCA territory. This ischemic area appears roughly divided between infarct and penumbra. No CTA but the left M1 is symmetrically patent. Electronically Signed   By: Monte Fantasia M.D.   On: 01/19/2015 14:46    EKG: Paced rhythm  Assessment/Plan Principal Problem:   CVA (cerebrovascular accident) [facility]): CT cerebral perfusion study. Will admit to step down given her current DKA. No MRI/MRA due to pacemaker. Outside window. Will get carotid Dopplers obtain lipid panel hemoglobin A1c. ASA and statin.  Speech therapy and occupational therapy. Bedside swallow eval. Patient with history of A. fib reportedly on Coumadin but not listed on her home medications. Evaluated by Dr Doy Mince with neuro while in ED. Recommendations pending.  Active Problems:  DKA (diabetic ketoacidoses) ([place]): Clearly related to decreased oral intake sig in [place] to #1. Will admit to step down and continued insulin drip initiated in the emergency department. An ion gap 23 on admission. Will follow DKA protocol. Hematocrit every 45. Once gap closes we'll transition IV fluids and resume Lantus.   Hyperkalemia/ hyponatremia: related to #2. Will monitor. Expect levels to correct and #2 resolves.   Acute renal failure superimposed on stage 3 chronic kidney disease ([place]): current creatinine appears to be above baseline. Iv fluids as noted above. Will hold nephrotoxins and monitor urine output. Of note, patient had recurrence of bladder cancer this summer s/p stent per daughter has had UTI since.   Atrial fibrillation [facility]): currently paced rhythm. Reportedly on coumadin but not on home meds. Will hold coreg for now allow for permissive HTN.   Bladder cancer: remote hx and recurrence 4 months ago. Per chart she decided not to have surgery and not a candidate for radiation given age and co-morbidities per chart. Chart indicates she was  considering Hospice.   HTN: on coreg and cosaar. Currently BP 124/95. Will hold these for now. Monitor  Chronic diastolic CHF (congestive heart failure) ([place]): appears euvolemic. Home meds include BB        CAD (coronary artery disease): no reports of chest pain. Initial troponin negative. Monitor on tele.        Dr Doy Mince neuro    Code Status: DNR DVT Prophylaxis: Family Communication: Daughter at bedside Disposition Plan: likley will need placement Time spent: 30 minutes  [facility]     I have examined the patient, reviewed the chart and discussed the case with Dyanne Carrel, NP. I agree with the assessment and plan as outlined above.   Debbe Odea, MD

## 2015-01-20 ENCOUNTER — Inpatient Hospital Stay (HOSPITAL_COMMUNITY): Payer: Medicare Other

## 2015-01-20 ENCOUNTER — Encounter (HOSPITAL_COMMUNITY): Payer: Medicare Other

## 2015-01-20 ENCOUNTER — Other Ambulatory Visit (HOSPITAL_COMMUNITY): Payer: Medicare Other

## 2015-01-20 DIAGNOSIS — E785 Hyperlipidemia, unspecified: Secondary | ICD-10-CM | POA: Insufficient documentation

## 2015-01-20 DIAGNOSIS — I2510 Atherosclerotic heart disease of native coronary artery without angina pectoris: Secondary | ICD-10-CM

## 2015-01-20 DIAGNOSIS — I5032 Chronic diastolic (congestive) heart failure: Secondary | ICD-10-CM

## 2015-01-20 DIAGNOSIS — N179 Acute kidney failure, unspecified: Secondary | ICD-10-CM

## 2015-01-20 DIAGNOSIS — N183 Chronic kidney disease, stage 3 (moderate): Secondary | ICD-10-CM

## 2015-01-20 DIAGNOSIS — I480 Paroxysmal atrial fibrillation: Secondary | ICD-10-CM

## 2015-01-20 DIAGNOSIS — L8990 Pressure ulcer of unspecified site, unspecified stage: Secondary | ICD-10-CM | POA: Insufficient documentation

## 2015-01-20 DIAGNOSIS — E1159 Type 2 diabetes mellitus with other circulatory complications: Secondary | ICD-10-CM

## 2015-01-20 LAB — BASIC METABOLIC PANEL
Anion gap: 13 (ref 5–15)
Anion gap: 11 (ref 5–15)
BUN: 46 mg/dL — AB (ref 6–20)
BUN: 49 mg/dL — AB (ref 6–20)
CALCIUM: 9.1 mg/dL (ref 8.9–10.3)
CHLORIDE: 99 mmol/L — AB (ref 101–111)
CO2: 21 mmol/L — AB (ref 22–32)
CO2: 22 mmol/L (ref 22–32)
CREATININE: 1.83 mg/dL — AB (ref 0.44–1.00)
CREATININE: 2.04 mg/dL — AB (ref 0.44–1.00)
Calcium: 8.6 mg/dL — ABNORMAL LOW (ref 8.9–10.3)
Chloride: 101 mmol/L (ref 101–111)
GFR calc Af Amer: 26 mL/min — ABNORMAL LOW (ref >60)
GFR calc non Af Amer: 25 mL/min — ABNORMAL LOW (ref >60)
GFR calc non Af Amer: 22 mL/min — ABNORMAL LOW (ref >60)
GFR, EST AFRICAN AMERICAN: 30 mL/min — AB (ref >60)
GLUCOSE: 93 mg/dL (ref 65–99)
GLUCOSE: 244 mg/dL — AB (ref 65–99)
POTASSIUM: 4.2 mmol/L (ref 3.5–5.1)
Potassium: 4.7 mmol/L (ref 3.5–5.1)
SODIUM: 132 mmol/L — AB (ref 135–145)
Sodium: 135 mmol/L (ref 135–145)

## 2015-01-20 LAB — GLUCOSE, CAPILLARY
GLUCOSE-CAPILLARY: 135 mg/dL — AB (ref 65–99)
GLUCOSE-CAPILLARY: 311 mg/dL — AB (ref 65–99)
GLUCOSE-CAPILLARY: 177 mg/dL — AB (ref 65–99)
Glucose-Capillary: 107 mg/dL — ABNORMAL HIGH (ref 65–99)
Glucose-Capillary: 246 mg/dL — ABNORMAL HIGH (ref 65–99)
Glucose-Capillary: 266 mg/dL — ABNORMAL HIGH (ref 65–99)
Glucose-Capillary: 277 mg/dL — ABNORMAL HIGH (ref 65–99)
Glucose-Capillary: 306 mg/dL — ABNORMAL HIGH (ref 65–99)

## 2015-01-20 LAB — HEMOGLOBIN A1C
Hgb A1c MFr Bld: 14.2 % — ABNORMAL HIGH (ref 4.8–5.6)
Mean Plasma Glucose: 361 mg/dL

## 2015-01-20 MED ORDER — LORAZEPAM 2 MG/ML IJ SOLN
1.0000 mg | Freq: Once | INTRAMUSCULAR | Status: AC
  Administered 2015-01-20: 1 mg via INTRAVENOUS
  Filled 2015-01-20: qty 1

## 2015-01-20 MED ORDER — ASPIRIN 300 MG RE SUPP
300.0000 mg | Freq: Every day | RECTAL | Status: DC
  Administered 2015-01-21 – 2015-01-22 (×2): 300 mg via RECTAL
  Filled 2015-01-20 (×2): qty 1

## 2015-01-20 MED ORDER — HYDRALAZINE HCL 20 MG/ML IJ SOLN: 5.0000 mg | INTRAMUSCULAR | Status: DC | PRN

## 2015-01-20 MED ORDER — CLOTRIMAZOLE 2 % VA CREA
1.0000 | TOPICAL_CREAM | Freq: Every day | VAGINAL | Status: AC
  Administered 2015-01-21 – 2015-01-22 (×3): 1 via VAGINAL
  Filled 2015-01-20 (×2): qty 21

## 2015-01-20 MED ORDER — CHLORHEXIDINE GLUCONATE 0.12 % MT SOLN
15.0000 mL | Freq: Two times a day (BID) | OROMUCOSAL | Status: DC
  Administered 2015-01-20 – 2015-01-25 (×10): 15 mL via OROMUCOSAL
  Filled 2015-01-20 (×12): qty 15

## 2015-01-20 MED ORDER — CETYLPYRIDINIUM CHLORIDE 0.05 % MT LIQD
7.0000 mL | Freq: Two times a day (BID) | OROMUCOSAL | Status: DC
  Administered 2015-01-20 – 2015-01-25 (×10): 7 mL via OROMUCOSAL

## 2015-01-20 MED ORDER — FLUCONAZOLE 150 MG PO TABS
150.0000 mg | ORAL_TABLET | Freq: Every day | ORAL | Status: DC
  Filled 2015-01-20: qty 1

## 2015-01-20 MED ORDER — ASPIRIN 300 MG RE SUPP
300.0000 mg | Freq: Every day | RECTAL | Status: DC
  Administered 2015-01-20: 300 mg via RECTAL
  Filled 2015-01-20: qty 1

## 2015-01-20 NOTE — Progress Notes (Signed)
Joyce Terry BTD:[id number] DOB: 09-Jan-1936 DOA: 01/19/2015 PCP: Geoffery Lyons, MD  Brief narrative: 79 y/o ? Atrial fibrillation Chad2vasc2 =4 on Coumadin Bradycardia + heart block status post PPM Prior GI bleed-history of internal hemorrhoids, small AVM cecum, left colonic diverticulosis HTN TY2 DM Known CAD status post stent mid LAD 01/2011. 02/2012 Transitional cell CA S/P TURP + stent placement 08/29/14-not candidate for cisplatin/cystectomy secondary to frailty and declined i overall condition as well as recent weight loss 60 pounds Lumbar degenerative disc disease  Admitted from home with aphasia and marked facial droop with right-sided weakness  On admission found to have WBC 20, hemoglobin 9, platelets 323, BUN/creatinine 0.7/2.3-baseline creatinine around 2     Past medical history-As per Problem list Chart reviewed as below-   Consultants:  Neuro  Procedures:  Ct head  Carotids  Echo p  Antibiotics:     Subjective   Aphasic but can indicate Hip pain  failed swallow Wincing in pain on pressing abdomen    Objective    Interim History:   Telemetry: nsr   Objective: Filed Vitals:   01/20/15 0805 01/20/15 0858 01/20/15 1000 01/20/15 1200  BP:   158/55 156/74  Pulse:   71 73  Temp: 98.3 F (36.8 C)   98.6 F (37 C)  TempSrc: Oral   Axillary  Resp:   15 16  Height:      Weight:      SpO2:  97% 98% 97%    Intake/Output Summary (Last 24 hours) at 01/20/15 1320 Last data filed at 01/20/15 1000  Gross per 24 hour  Intake 474.16 ml  Output      0 ml  Net 474.16 ml    Exam:  General: eomi, facial assym noted-non-coop c exam Cardiovascular: s1 s 2no m/r/g Respiratory: clear no added sound Abdomen: tender LLQ Skin nad Neuro profoundly weak R side  Data Reviewed: Basic Metabolic Panel:  Recent Labs Lab 01/19/15 1230 01/19/15 1815 01/19/15 2124 01/20/15 0114 01/20/15 0648  NA 128* 135 135 135 132*  K 5.7* 4.0 4.5 4.7  4.2  CL 92* 98* 101 101 99*  CO2 13* 20* 22 21* 22  GLUCOSE 794* 456* 196* 93 244*  BUN 47* 47* 47* 46* 49*  CREATININE 2.55* 2.30* 2.05* 1.83* 2.04*  CALCIUM 9.3 9.0 9.0 9.1 8.6*   Liver Function Tests:  Recent Labs Lab 01/19/15 1230  AST 17  ALT 13*  ALKPHOS 125  BILITOT 1.7*  PROT 5.8*  ALBUMIN 2.4*   No results for input(s): LIPASE, AMYLASE in the last 168 hours. No results for input(s): AMMONIA in the last 168 hours. CBC:  Recent Labs Lab 01/19/15 1230 01/19/15 1815  WBC 24.5* 20.6*  NEUTROABS 22.8*  --   HGB 9.9* 9.8*  HCT 33.8* 32.5*  MCV 71.9* 69.0*  PLT 312 323   Cardiac Enzymes:  Recent Labs Lab 01/19/15 1230  TROPONINI 0.03   BNP: Invalid input(s): POCBNP CBG:  Recent Labs Lab 01/20/15 0048 01/20/15 0146 01/20/15 0148 01/20/15 0407 01/20/15 0843  GLUCAP 266* 135* 107* 306* 246*    Recent Results (from the past 240 hour(s))  MRSA PCR Screening     Status: None   Collection Time: 01/19/15  5:12 PM  Result Value Ref Range Status   MRSA by PCR NEGATIVE NEGATIVE Final    Comment:        The GeneXpert MRSA Assay (FDA approved for NASAL specimens only), is one component of a comprehensive MRSA colonization  surveillance program. It is not intended to diagnose MRSA infection nor to guide or monitor treatment for MRSA infections.      Studies:              All Imaging reviewed and is as per above notation   Scheduled Meds: .  stroke: mapping our early stages of recovery book   Does not apply Once  . antiseptic oral rinse  7 mL Mouth Rinse q12n4p  . aspirin  300 mg Rectal Daily  . chlorhexidine  15 mL Mouth Rinse BID  . ciprofloxacin  400 mg Intravenous Q24H  . [START ON 01/21/2015] fentaNYL  50 mcg Transdermal Q72H  . heparin  5,000 Units Subcutaneous 3 times per day  . insulin aspart  0-15 Units Subcutaneous Q4H  . insulin glargine  7 Units Subcutaneous QHS  . mometasone-formoterol  2 puff Inhalation BID   Continuous  Infusions: . sodium chloride 50 mL/hr at 01/20/15 0108  . insulin (NOVOLIN-R) infusion Stopped (01/20/15 0141)     Assessment/Plan:   1. Acute CVA, L MCA-Unable to give NOAC or coumadin.  Need to discuss c family going forward medications.  Will need to d/w Family overall GOC as well.  Will ask SLP to re-evaluate otherwise will need to d/w family other feeding options.  Continue Rectal ASA 300 mg daily 2. HTn Urgency-cannot take PO.  schedule hydralazine 5 mg iv q4 prn for Pressure above 150/90 3. Abd + L Hip pain-L hip xrays show no anomaly.  Get Ct abd if persisting pain in am.  watch for dark/tarry stool. 4. Pyelonephritis-Ua small leuk, neg nitrites.  Reasonable Rx c Cipro for ~ 3 days then stop 5. DKA-found on admission-now resolved. 6. DM ty II-npo as failed swallow.  Q4 checks.  Continue saline 50 cc/hr  Glucose is 250-300 range.  COnt Lantus 7 U qhs and SSI.  Home meds include glyburide 5 am, metformin 1000 bid 7. Afib RVR Chad2vasc2=4-cannot keep on coumadin for now.  Will discuss moving forward.  On coreg 3.125 bid 8. Transitional Ca bladder s/p TURB-non op candidate 9. Lumbar DDD-hold pain meds.  Stable med condition.  Continue Fentanyl 50 mcg q 72 10. Prior GI bleed-stable 11. Cad with stents 2012/2013-considerLosartan25 daily,/Coreg 3.125 bid when able tot take PO   Appt with PCP: Requested Code Status: Full code/DNR Family Communication: family not +.  I called the below # but received no answer Maynor,Theresa Niece [phone]  [phone]   Disposition Plan: ?snf DVT prophylaxis: Lovenox Consultants: Neuro  Verneita Griffes, MD  [facility] Pager [phone] 01/20/2015, 1:20 PM    LOS: 1 day

## 2015-01-20 NOTE — Progress Notes (Signed)
Inpatient Diabetes Program Recommendations  AACE/ADA: New Consensus Statement on Inpatient Glycemic Control (2015)  Target Ranges:  Prepandial:   less than 140 mg/dL      Peak postprandial:   less than 180 mg/dL (1-2 hours)      Critically ill patients:  140 - 180 mg/dL   Review of Glycemic Control:  Results for Joyce Terry, Joyce Terry (MRN [id number]) as of 01/20/2015 10:06  Ref. Range 01/20/2015 00:48 01/20/2015 01:46 01/20/2015 01:48 01/20/2015 04:07 01/20/2015 08:43  Glucose-Capillary Latest Ref Range: 65-99 mg/dL 266 (H) 135 (H) 107 (H) 306 (H) 246 (H)   Diabetes history: Diabetes Mellitus (see's Dr. Reynaldo Minium) Outpatient Diabetes medications: Lantus 15 units daily, Novolog 4-8 units tid with meals, Metformin, Diabeta Current orders for Inpatient glycemic control:  Note that patient was on insulin drip overnight. She was transitioned to SQ insulin overnight.  Patient received Lantus 7 units upon transition off insulin drip.   Inpatient Diabetes Program Recommendations:    Blood sugars increased.  May consider increasing Lantus to home dose of 15 units daily. Will follow.  Thanks, Adah Perl, RN, BC-ADM Inpatient Diabetes Coordinator Pager [phone] (8a-5p)

## 2015-01-20 NOTE — Progress Notes (Signed)
STROKE TEAM PROGRESS NOTE   HISTORY Joyce Terry is an 79 y.o. female with Afib on coumadin per daughter and has a pacemaker. She was last seen last night at 2000 (LKW 01/18/2015 at 20000). Daughter notes she has not been eating well and not taking her medications for at least 2 days. She was concerned about her last night due to her decreased appetite. She tried to get her mother to come to hospital but she declined. She was normal prior to her leaving last night. This AM she went to check on her (patient was not answering her phone). She was found on the floor, aphasic, and not moving her right ar or leg well. Modified Rankin: Rankin Score=0. Patient was not administered TPA secondary to delay in arrival. She was admitted to step down for further evaluation and treatment.   SUBJECTIVE (INTERVAL HISTORY) No family is at the bedside.  Overall she feels her condition is stable. She still has global aphasia, did not pass swallow yet.    OBJECTIVE Temp:  [97 F (36.1 C)-98.6 F (37 C)] 98.6 F (37 C) (10/25 1200) Pulse Rate:  [70-95] 73 (10/25 1200) Cardiac Rhythm:  [-] Bundle branch block;Ventricular paced (10/25 0805) Resp:  [11-19] 16 (10/25 1200) BP: (111-174)/(30-96) 156/74 mmHg (10/25 1200) SpO2:  [94 %-100 %] 97 % (10/25 1200) Weight:  [81.8 kg (180 lb 5.4 oz)] 81.8 kg (180 lb 5.4 oz) (10/25 0800)  CBC:   Recent Labs Lab 01/19/15 1230 01/19/15 1815  WBC 24.5* 20.6*  NEUTROABS 22.8*  --   HGB 9.9* 9.8*  HCT 33.8* 32.5*  MCV 71.9* 69.0*  PLT 312 962    Basic Metabolic Panel:   Recent Labs Lab 01/20/15 0114 01/20/15 0648  NA 135 132*  K 4.7 4.2  CL 101 99*  CO2 21* 22  GLUCOSE 93 244*  BUN 46* 49*  CREATININE 1.83* 2.04*  CALCIUM 9.1 8.6*    Lipid Panel:     Component Value Date/Time   CHOL 172 01/19/2015 1815   TRIG 401* 01/19/2015 1815   HDL 19* 01/19/2015 1815   CHOLHDL 9.1 01/19/2015 1815   VLDL UNABLE TO CALCULATE IF TRIGLYCERIDE OVER 400 mg/dL  01/19/2015 1815   LDLCALC UNABLE TO CALCULATE IF TRIGLYCERIDE OVER 400 mg/dL 01/19/2015 1815   HgbA1c:  Lab Results  Component Value Date   HGBA1C 14.2* 01/19/2015   Urine Drug Screen:     Component Value Date/Time   LABOPIA NONE DETECTED 01/19/2015 1230   COCAINSCRNUR NONE DETECTED 01/19/2015 1230   LABBENZ NONE DETECTED 01/19/2015 1230   AMPHETMU NONE DETECTED 01/19/2015 1230   THCU NONE DETECTED 01/19/2015 1230   LABBARB NONE DETECTED 01/19/2015 1230      IMAGING  Dg Chest 2 View 01/19/2015   Cardiomegaly.  No acute cardiopulmonary process.   Ct Head Wo Contrast 01/20/2015  IMPRESSION: Evolving, moderate-sized left MCA infarct. No intracranial hemorrhage.   01/19/2015   No acute abnormality. Atrophy and chronic microvascular ischemic change.   Ct Cerebral Perfusion W/cm 01/19/2015   Ischemia throughout the posterior half left MCA territory. This ischemic area appears roughly divided between infarct and penumbra. No CTA but the left M1 is symmetrically patent.   CUS pending  2D echo pending   PHYSICAL EXAM  Temp:  [97 F (36.1 C)-98.6 F (37 C)] 98.1 F (36.7 C) (10/25 1500) Pulse Rate:  [71-81] 73 (10/25 1200) Resp:  [14-19] 16 (10/25 1200) BP: (124-174)/(49-96) 156/74 mmHg (10/25 1200) SpO2:  [94 %-100 %]  97 % (10/25 1200) Weight:  [180 lb 5.4 oz (81.8 kg)] 180 lb 5.4 oz (81.8 kg) (10/25 0800)  General - Well nourished, well developed, in no apparent distress.  Ophthalmologic - Fundi not visualized due to noncooperation.  Cardiovascular - Regular rate and rhythm, paced rhythm.  Mental Status -  Awake, alert, but global aphasia with right side neglect, not able to repeat or name.  Cranial Nerves II - XII - II - not blinking to visual threat on the right. III, IV, VI - left side gaze preference but able to cross midline. V - not cooperative on exam. VII - right facial droop. VIII, X, XI - not cooperative on exam XII - Tongue in midline in  mouth.  Motor Strength - The patient's strength was RUE 2-/5 and RLE 2+/5, left UE and LE move spontaneously.  Bulk was normal and fasciculations were absent.   Motor Tone - Muscle tone was assessed at the neck and appendages and was normal.  Reflexes - The patient's reflexes were 1+ in all extremities and she had right babinski.  Sensory - Light touch, temperature/pinprick test not cooperative.    Coordination - not cooperative on exam.  Tremor was absent.  Gait and Station - not tested   ASSESSMENT/PLAN Ms. Joyce Terry is a 79 y.o. female with history of diabetes type 2 insulin-dependent, atrial fibrillation on Coumadin but INR 1.12 on arrival, hyperlipidemia, hypertension, chronic systolic heart failure, urethral cancer with recurrence this year presenting with aphasia and right hemiplegia. She did not receive IV t-PA due to late arrival.   Stroke:  Left MCA infarct, embolic secondary to afib not complaint with medication  Resultant  Global aphaia, right hemiplegia  MRI  pacer  MRA  pacer  Carotid Doppler  pending  2D Echo  pending  LDL not able to calculate due to high TG  HgbA1c 14.2  subq heparin for VTE prophylaxis Diet NPO time specified  warfarin daily but noncompliance prior to admission, now aspirin 300 mg suppository daily. For stroke prevention, she needs to be on anticoagulation. Due to moderate sized MCA infarct, we recommend start anticoagulation in 5-7 days. If pt option NOAC, will recommend eliquis in 5 days. But if resume warfarin, will recommend start in day 3 post stroke with expectation of therapeutic INR at 5-7 days.   Patient counseled to be compliant with her antithrombotic medications  Ongoing aggressive stroke risk factor management  Therapy recommendations:  pending  Disposition:  pending  Atrial Fibrillation  Home anticoagulation: discontinued in the hospital  INR 1.12 on admission, not complaint with medication  Due to moderate  sized MCA infarct, we recommend start anticoagulation in 5-7 days. If pt option NOAC, will recommend eliquis in 5 days. But if resume warfarin, will recommend start in day 3 post stroke with expectation of therapeutic INR at 5-7 days.     Hypertension  Stable Permissive hypertension (OK if < 180/105 due to end organ damage) but gradually normalize in 5-7 days.  Hyperlipidemia  Home meds:  Vytorin  LDL not able to calculate due to high TG, goal < 70  Resume home meds once pt access  Continue statin at discharge  Diabetes  HgbA1c 14.2, goal < 7.0  Uncontrolled  Need more aggressive glucose control  SSI   CBG monitoring  DM education  Dysphagia  Did not pass swallow  May need to consider PANDA or PEG  Other Stroke Risk Factors  Advanced age  Hx stroke/TIA  Coronary  artery disease  Other Active Problems  CKD - Cre 2.04  Leukocytosis  Urethral cancer - f/u with oncology  Hospital day # 1  Rosalin Hawking, MD PhD Stroke Neurology 01/20/2015 5:11 PM      To contact Stroke Continuity provider, please refer to [email or web address] After hours, contact General Neurology

## 2015-01-20 NOTE — Evaluation (Signed)
Speech Language Pathology Evaluation Patient Details Name: Joyce Terry MRN: [id number] DOB: Mar 28, 1936 Today's Date: 01/20/2015 Time: 8250-5397 SLP Time Calculation (min) (ACUTE ONLY): 25 min  Problem List:  Patient Active Problem List   Diagnosis Date Noted  . CVA (cerebrovascular accident) ([place]) 01/19/2015  . DKA (diabetic ketoacidoses) ([place]) 01/19/2015  . Acidosis 01/19/2015  . Acute renal failure superimposed on stage 3 chronic kidney disease ([place]) 01/19/2015  . UTI (lower urinary tract infection) 01/19/2015  . Anemia   . CHF (congestive heart failure) ([place])   . Bladder cancer ([place]) 08/29/2014  . Orthostasis 04/09/2014  . Dehydration 06/23/2013  . Angina, class III ([place]) 03/17/2012  . Angina decubitus ([place]) 02/28/2012  . Cough due to ACE inhibitor 06/01/2011  . Sinoatrial node dysfunction ([facility])   . CAD (coronary artery disease) 02/15/2011  . Dyspnea 01/26/2011  . Chronic diastolic CHF (congestive heart failure) ([place]) 09/20/2010  . OBESITY, MORBID 03/03/2010  . Atrial fibrillation ([place]) 03/03/2010  . PPM-Medtronic 03/03/2010  . DM 03/04/2009  . ANEMIA-IRON DEFICIENCY 03/04/2009  . OTHER AND UNSPECIFIED COAGULATION DEFECTS 03/04/2009  . GERD 03/04/2009  . PERSONAL HX COLONIC POLYPS 03/04/2009  . RECTAL BLEEDING 08/22/2007  . DYSPHAGIA 08/22/2007   Past Medical History:  Past Medical History  Diagnosis Date  . Hypertension   . Atrial fibrillation ([place])   . Obesity   . Coronary artery disease     cardiac catheterization in 1997 -- Est. EF of 50%  . Fatigue   . Stress   . Chest pain   . Peptic ulcer 1985  . GERD (gastroesophageal reflux disease)   . Depression   . Sinoatrial node dysfunction ([facility])   . Gastric polyps   . Colon polyps 05/19/2009    Tubular Adenomous polyps  . Diverticulosis   . Endometrial cancer ([place]) 1995  . Hypercholesteremia   . DVT (deep venous thrombosis) ([place]) 1960's    ?RLE (03/16/2012)  . Exertional dyspnea     "sometimes"  (03/16/2012)  . Type II diabetes mellitus ([place])   . Anemia     "iron transfusions ?twice" (03/16/2012)  . Internal hemorrhoid, bleeding     "sometimes" (03/16/2012)  . Migraines     "when I was going thru menopause; none lately" (03/16/2012)  . Arthritis     "qwhere" (03/16/2012)  . Chronic back pain   . Angina     occasional, none recent  . Presence of permanent cardiac pacemaker   . CHF (congestive heart failure) ([facility])     chronic systolic CHF  . Pneumonia 1991, 2012    x2  . Urothelial carcinoma ([place]) 08/29/14  . Hydronephrosis, right   . History of atrial fibrillation   . Hx of cardiac arrhythmia   . Hx of esophageal reflux   . DKA (diabetic ketoacidoses) ([place])   . CVA (cerebral infarction)   . CKD (chronic kidney disease), stage II    Past Surgical History:  Past Surgical History  Procedure Laterality Date  . Vesicovaginal fistula closure w/ tah  1995  . Joint replacement      LEFT HIP   02/2008  . Cholecystectomy  fall 2001  . Cardiac catheterization  02/21/96; 03/16/2012    Est. EF of 50%; "just to look" (03/16/2012)  . Coronary angioplasty with stent placement  02/15/11    "one"  . Coronary angioplasty  03/16/2012    "couldn't put stent in cause of where it is" (03/16/2012)  . Tonsillectomy  ~ 1962  . Total hip arthroplasty  02/2008    "left" (03/16/2012)  . Insert / replace / remove pacemaker  07/23/99    atrial insertion of the AV node -- Medtronic single chamber pacemaker   . Insert / replace / remove pacemaker  2012    "replaced" (03/16/2012)  . Ptca  02/2012    Balloon angioplast of jailed diagonal branch by prior LAD stent  . Percutaneous coronary stent intervention (pci-s) N/A 02/15/2011    Procedure: PERCUTANEOUS CORONARY STENT INTERVENTION (PCI-S);  Surgeon: Burnell Blanks, MD;  Location: [facility] CATH LAB;  Service: Cardiovascular;  Laterality: N/A;  . Pacemaker generator change N/A 03/02/2011    Procedure: PACEMAKER GENERATOR CHANGE;  Surgeon:  Evans Lance, MD;  Location: [facility] CATH LAB;  Service: Cardiovascular;  Laterality: N/A;  . Left heart catheterization with coronary angiogram N/A 03/16/2012    Procedure: LEFT HEART CATHETERIZATION WITH CORONARY ANGIOGRAM;  Surgeon: Thayer Headings, MD;  Location: [facility] CATH LAB;  Service: Cardiovascular;  Laterality: N/A;  . Percutaneous coronary stent intervention (pci-s) N/A 03/16/2012    Procedure: PERCUTANEOUS CORONARY STENT INTERVENTION (PCI-S);  Surgeon: Burnell Blanks, MD;  Location: [facility] CATH LAB;  Service: Cardiovascular;  Laterality: N/A;  . Abdominal hysterectomy  1995    "endometrial cancer" (03/16/2012) total   . Dilation and curettage of uterus  late 1950's    "had probably 5; several miscarriages when I was younger"  . Transurethral resection of bladder tumor with gyrus (turbt-gyrus) N/A 08/29/2014    Procedure: TRANSURETHRAL RESECTION OF BLADDER TUMOR WITH GYRUS (TURBT-GYRUS);  Surgeon: Ardis Hughs, MD;  Location: [facility] ORS;  Service: Urology;  Laterality: N/A;  . Cystoscopy w/ ureteral stent placement Bilateral 08/29/2014    Procedure: BILATERAL RETROGRADE PYELOGRAM /RIGHT URETERAL STENT PLACEMENT;  Surgeon: Ardis Hughs, MD;  Location: [facility] ORS;  Service: Urology;  Laterality: Bilateral;   HPI:  Joyce Terry is a 79 y.o. female PMH, DM type 2 insulin-dependent, AFib on Coumadin, hyperlipidemia, HTN, chronic systolic heart failure, bladder cancer remotely was a recurrence this year, DVT. Daughter reports pt was previously independent with ADLs, pt found down at home on 01/19/15 presented to the ED with strokelike symptoms (expressive and receptive aphasia, right sided weakness). Initial evaluation reveals DKA within an ion gap of 20 and CT cerebral perfusion scan revealing ischemia throughout the posterior half of left MCA territory. This ischemic area appears roughly divided between infarct and penumbra. Thrombus noted in M2 branch.Per MD note, pt appears to have  progressing dementia. SLP consulted for BSE and SLE.    Assessment / Plan / Recommendation Clinical Impression  Pt presents with intermittent focused attention limiting and interferring assessment of cognitive function. Pt demonstrates receptive and expressive aphasia. Pt does not follow verbal commands with max verbal, tactile, and visual cuing: modeling and hand over hand assist. Pt's nonverbal "yes" response is inconsistent and inaccurate. Pt's verbal output is limited to moaning. Pt did not appropriately use familiar items in a functional task, could be due to inattention and lack of awareness or possible ideational apraxia. SLP will f/u with further diagnostic treatment.     SLP Assessment  Patient needs continued Speech Lanaguage Pathology Services    Follow Up Recommendations  Inpatient Rehab    Frequency and Duration min 2x/week  2 weeks   Pertinent Vitals/Pain Pain Assessment: Faces Faces Pain Scale: Hurts a little bit Pain Descriptors / Indicators: Moaning Pain Intervention(s): Limited activity within patient's tolerance;Monitored during session   SLP Goals  Potential to Achieve Goals (  ACUTE ONLY): Fair Potential Considerations (ACUTE ONLY): Ability to learn/carryover information;Co-morbidities;Cooperation/participation level;Severity of impairments  SLP Evaluation Prior Functioning  Cognitive/Linguistic Baseline: Within functional limits Type of Home: House Available Help at Discharge: Family;Available PRN/intermittently Vocation: Retired   Associate Professor  Overall Cognitive Status: Impaired/Different from baseline Arousal/Alertness: Awake/alert Orientation Level: Disoriented X4 Attention: Focused (intermittent) Focused Attention: Impaired Focused Attention Impairment: Verbal basic;Functional basic;Verbal complex;Functional complex Memory: Impaired Memory Impairment: Other (comment) (unable to fully assess due to attention deficit) Awareness: Impaired Awareness  Impairment: Intellectual impairment;Emergent impairment;Anticipatory impairment Problem Solving: Impaired Problem Solving Impairment: Verbal basic;Verbal complex;Functional basic;Functional complex Executive Function: Reasoning;Organizing;Decision Camera operator;Initiating;Self Correcting;Sequencing Reasoning: Impaired Reasoning Impairment: Verbal basic;Verbal complex;Functional basic;Functional complex Sequencing: Impaired Sequencing Impairment: Verbal basic;Verbal complex;Functional basic;Functional complex Organizing: Impaired Organizing Impairment: Verbal basic;Verbal complex;Functional basic;Functional complex Decision Making: Impaired Decision Making Impairment: Verbal basic;Verbal complex;Functional basic;Functional complex Initiating: Impaired Initiating Impairment: Verbal basic;Verbal complex;Functional basic;Functional complex Self Monitoring: Impaired Self Monitoring Impairment: Verbal basic;Verbal complex;Functional basic;Functional complex Self Correcting: Impaired Self Correcting Impairment: Verbal basic;Verbal complex;Functional basic;Functional complex Behaviors: Restless Safety/Judgment: Impaired    Comprehension  Auditory Comprehension Overall Auditory Comprehension: Impaired Yes/No Questions: Impaired Basic Biographical Questions: 0-25% accurate Commands: Impaired One Step Basic Commands: 0-24% accurate Two Step Basic Commands: 0-24% accurate Conversation: Simple Other Conversation Comments: impaired Interfering Components: Attention EffectiveTechniques: Repetition;Increased volume Visual Recognition/Discrimination Discrimination: Not tested Reading Comprehension Reading Status: Not tested    Expression Expression Primary Mode of Expression: Verbal Verbal Expression Overall Verbal Expression: Impaired Initiation: No impairment Automatic Speech:  (impaired) Repetition: Impaired Level of Impairment: Word level Naming: Impairment Responsive:  0-25% accurate Confrontation: Impaired Convergent: 0-24% accurate Divergent: Not tested Pragmatics: Unable to assess Interfering Components: Attention Written Expression Written Expression: Not tested   Oral / Motor Oral Motor/Sensory Function Overall Oral Motor/Sensory Function: Appears within functional limits for tasks assessed Labial ROM: Within Functional Limits Labial Symmetry: Within Functional Limits Lingual ROM: Within Functional Limits Lingual Symmetry: Within Functional Limits Facial ROM: Within Functional Limits Facial Symmetry: Right droop Motor Speech Overall Motor Speech:  (Unable to Assess)   GO    Lanier Ensign, Student-SLP  Lanier Ensign 01/20/2015, 9:15 AM

## 2015-01-20 NOTE — Evaluation (Signed)
Clinical/Bedside Swallow Evaluation Patient Details  Name: Joyce Terry MRN: [id number] Date of Birth: 1935/11/06  Today's Date: 01/20/2015 Time: SLP Start Time (ACUTE ONLY): 0815 SLP Stop Time (ACUTE ONLY): 0840 SLP Time Calculation (min) (ACUTE ONLY): 25 min  Past Medical History:  Past Medical History  Diagnosis Date  . Hypertension   . Atrial fibrillation ([place])   . Obesity   . Coronary artery disease     cardiac catheterization in 1997 -- Est. EF of 50%  . Fatigue   . Stress   . Chest pain   . Peptic ulcer 1985  . GERD (gastroesophageal reflux disease)   . Depression   . Sinoatrial node dysfunction (HCC)   . Gastric polyps   . Colon polyps 05/19/2009    Tubular Adenomous polyps  . Diverticulosis   . Endometrial cancer ([place]) 1995  . Hypercholesteremia   . DVT (deep venous thrombosis) ([place]) 1960's    ?RLE (03/16/2012)  . Exertional dyspnea     "sometimes" (03/16/2012)  . Type II diabetes mellitus ([place])   . Anemia     "iron transfusions ?twice" (03/16/2012)  . Internal hemorrhoid, bleeding     "sometimes" (03/16/2012)  . Migraines     "when I was going thru menopause; none lately" (03/16/2012)  . Arthritis     "qwhere" (03/16/2012)  . Chronic back pain   . Angina     occasional, none recent  . Presence of permanent cardiac pacemaker   . CHF (congestive heart failure) (HCC)     chronic systolic CHF  . Pneumonia 1991, 2012    x2  . 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 Hydronephrosis, right   . History of atrial fibrillation   . Hx of cardiac arrhythmia   . Hx of esophageal reflux   . DKA (diabetic ketoacidoses) ([place])   . CVA (cerebral infarction)   . CKD (chronic kidney disease), stage II    Past Surgical History:  Past Surgical History  Procedure Laterality Date  . Vesicovaginal fistula closure w/ tah  1995  . Joint replacement      LEFT HIP   02/2008  . Cholecystectomy  fall 2001  . Cardiac catheterization  02/21/96; 03/16/2012    Est. EF of 50%;  "just to look" (03/16/2012)  . Coronary angioplasty with stent placement  02/15/11    "one"  . Coronary angioplasty  03/16/2012    "couldn't put stent in cause of where it is" (03/16/2012)  . Tonsillectomy  ~ 1962  . Total hip arthroplasty  02/2008    "left" (03/16/2012)  . Insert / replace / remove pacemaker  07/23/99    atrial insertion of the AV node -- [employer]onic single chamber pacemaker   . Insert / replace / remove pacemaker  2012    "replaced" (03/16/2012)  . Ptca  02/2012    Balloon angioplast of jailed diagonal branch by prior LAD stent  . Percutaneous coronary stent intervention (pci-s) N/A 02/15/2011    Procedure: PERCUTANEOUS CORONARY STENT INTERVENTION (PCI-S);  Surgeon: Burnell Blanks, MD;  Location: [facility] CATH LAB;  Service: Cardiovascular;  Laterality: N/A;  . Pacemaker generator change N/A 03/02/2011    Procedure: PACEMAKER GENERATOR CHANGE;  Surgeon: Evans Lance, MD;  Location: [facility] CATH LAB;  Service: Cardiovascular;  Laterality: N/A;  . Left heart catheterization with coronary angiogram N/A 03/16/2012    Procedure: LEFT HEART CATHETERIZATION WITH CORONARY ANGIOGRAM;  Surgeon: Thayer Headings, MD;  Location: [facility] CATH LAB;  Service: Cardiovascular;  Laterality:  N/A;  . Percutaneous coronary stent intervention (pci-s) N/A 03/16/2012    Procedure: PERCUTANEOUS CORONARY STENT INTERVENTION (PCI-S);  Surgeon: Burnell Blanks, MD;  Location: [facility] CATH LAB;  Service: Cardiovascular;  Laterality: N/A;  . Abdominal hysterectomy  1995    "endometrial cancer" (03/16/2012) total   . Dilation and curettage of uterus  late 1950's    "had probably 5; several miscarriages when I was younger"  . Transurethral resection of bladder tumor with gyrus (turbt-gyrus) N/A 08/29/2014    Procedure: TRANSURETHRAL RESECTION OF BLADDER TUMOR WITH GYRUS (TURBT-GYRUS);  Surgeon: Ardis Hughs, MD;  Location: [facility] ORS;  Service: Urology;  Laterality: N/A;  . Cystoscopy w/ ureteral stent  placement Bilateral 08/29/2014    Procedure: BILATERAL RETROGRADE PYELOGRAM /RIGHT URETERAL STENT PLACEMENT;  Surgeon: Ardis Hughs, MD;  Location: [facility] ORS;  Service: Urology;  Laterality: Bilateral;   HPI:  Joyce Terry is a 79 y.o. female PMH, DM type 2 insulin-dependent, AFib on Coumadin, hyperlipidemia, HTN, chronic systolic heart failure, bladder cancer remotely was a recurrence this year, DVT. Daughter reports pt was previously independent with ADLs, pt found down at home on 01/19/15 presented to the ED with strokelike symptoms (expressive and receptive aphasia, right sided weakness). Initial evaluation reveals DKA within an ion gap of 20 and CT cerebral perfusion scan revealing ischemia throughout the posterior half of left MCA territory. This ischemic area appears roughly divided between infarct and penumbra. Thrombus noted in M2 branch.Per MD note, pt appears to have progressing dementia. SLP consulted for BSE and SLE.    Assessment / Plan / Recommendation Clinical Impression  Pt demonstrated severe oral dysphagia characterized by poor attention and awareness of bolus with no overt s/s of aspiration. Pt held all boluses in mouth, delayed anterior to posterior transit, expect delayed swallow. SLP provided total assist and max verbal, visual, and tactile cues during the session. Pt was restless and continued to move and lean towards weak right side. Pt has a high risk of aspiration due to status as a dependent feeder, nonverbal, and poor posture. SLP will f/u with po trials focusing on attention and awareness.    Aspiration Risk  Severe    Diet Recommendation NPO   Medication Administration: Via alternative means    Other  Recommendations Oral Care Recommendations: Oral care QID   Follow Up Recommendations  Inpatient Rehab    Frequency and Duration min 2x/week  2 weeks   Pertinent Vitals/Pain NA    SLP Swallow Goals     Swallow Study Prior Functional Status   Cognitive/Linguistic Baseline: Within functional limits Type of Home: House Available Help at Discharge: Family;Available PRN/intermittently Vocation: Retired    Solicitor Other Pertinent Information: Joyce Terry is a 79 y.o. female PMH, DM type 2 insulin-dependent, AFib on Coumadin, hyperlipidemia, HTN, chronic systolic heart failure, bladder cancer remotely was a recurrence this year, DVT. Daughter reports pt was previously independent with ADLs, pt found down at home on 01/19/15 presented to the ED with strokelike symptoms (expressive and receptive aphasia, right sided weakness). Initial evaluation reveals DKA within an ion gap of 20 and CT cerebral perfusion scan revealing ischemia throughout the posterior half of left MCA territory. This ischemic area appears roughly divided between infarct and penumbra. Thrombus noted in M2 branch.Per MD note, pt appears to have progressing dementia. SLP consulted for BSE and SLE.  Type of Study: Bedside swallow evaluation Diet Prior to this Study: NPO Temperature Spikes Noted: No Respiratory Status: Room air History  of Recent Intubation: No Behavior/Cognition: Alert;Doesn't follow directions;Requires cueing;Pleasant mood Oral Cavity - Dentition: Poor condition;Missing dentition Self-Feeding Abilities: Total assist Patient Positioning: Upright in bed Baseline Vocal Quality: Not observed Volitional Cough: Cognitively unable to elicit Volitional Swallow: Able to elicit    Oral/Motor/Sensory Function Overall Oral Motor/Sensory Function: Appears within functional limits for tasks assessed Labial ROM: Within Functional Limits Labial Symmetry: Within Functional Limits Lingual ROM: Within Functional Limits Lingual Symmetry: Within Functional Limits Facial ROM: Within Functional Limits Facial Symmetry: Right droop   Ice Chips Ice chips: Impaired Presentation: Spoon Oral Phase Impairments: Impaired anterior to posterior transit;Poor awareness of  bolus Oral Phase Functional Implications: Oral holding Pharyngeal Phase Impairments: Suspected delayed Swallow   Thin Liquid Thin Liquid: Impaired Presentation: Cup Oral Phase Impairments: Impaired anterior to posterior transit;Poor awareness of bolus    Nectar Thick Nectar Thick Liquid: Not tested   Honey Thick Honey Thick Liquid: Not tested   Puree Puree: Impaired Presentation: Spoon Oral Phase Impairments: Impaired anterior to posterior transit;Poor awareness of bolus Oral Phase Functional Implications: Oral holding;Oral residue   Solid   GO    Solid: Not tested      Lanier Ensign, Student-SLP  Lanier Ensign 01/20/2015,9:24 AM

## 2015-01-20 NOTE — Evaluation (Signed)
Physical Therapy Evaluation Patient Details Name: Joyce Terry MRN: [id number] DOB: 1935-06-28 Today's Date: 01/20/2015   History of Present Illness  Joyce Terry is a 79 y.o. female past medical history that includes, diabetes type 2 insulin-dependent, atrial fibrillation on Coumadin, hyperlipidemia, hypertension, chronic systolic heart failure, bladder cancer remotely was a recurrence this year, DVT presents to the emergency department with strokelike symptoms. Initial evaluation reveals DKA within an ion gap of 20 and CT cerebral perfusion scan revealing ischemia throughout the posterior half left MCA territory.  Clinical Impression  Pt presenting with global aphasia, restlessness, and R sided weakness. Pt was moving L UE and purposefully but not to command. Pt unable to follow 1 step command or initiate tasks asked. Pt did attend to PT when patients name said however unable to answer questions accurately both verbally or shaking of head yes/no. Pt indep PTA but now requires total assist for all mobility and ADLs. Recommend CIR upon d/c to address above impairments and progress level of function.    Follow Up Recommendations CIR    Equipment Recommendations   (TBD)    Recommendations for Other Services Rehab consult     Precautions / Restrictions Precautions Precautions: Fall Precaution Comments: global aphasia Restrictions Weight Bearing Restrictions: No      Mobility  Bed Mobility Overal bed mobility: Needs Assistance;+2 for physical assistance;+ 2 for safety/equipment Bed Mobility: Supine to Sit;Sit to Supine     Supine to sit: Total assist;+2 for physical assistance Sit to supine: Total assist;+2 for physical assistance   General bed mobility comments: pt unable to follow commands or sequence task asked  Transfers                 General transfer comment: unsafe to attempt this date  Ambulation/Gait                Stairs             Wheelchair Mobility    Modified Rankin (Stroke Patients Only) Modified Rankin (Stroke Patients Only) Pre-Morbid Rankin Score: No significant disability Modified Rankin: Severe disability     Balance Overall balance assessment: Needs assistance Sitting-balance support: Feet supported;No upper extremity supported Sitting balance-Leahy Scale: Poor Sitting balance - Comments: pt with retropulsion and pushing posteiorly and to the Right   Standing balance support:  (unable)                                 Pertinent Vitals/Pain Pain Assessment: Faces Faces Pain Scale: Hurts little more Pain Location: unclear however pt reaching for groin area and noted red, irritation and yeast present at vagina Pain Descriptors / Indicators: Moaning;Guarding;Grimacing Pain Intervention(s): Limited activity within patient's tolerance    Home Living Family/patient expects to be discharged to:: Inpatient rehab   Available Help at Discharge: Family;Available PRN/intermittently Type of Home: House           Additional Comments: per chart, neice reports pt to have lived alone and was indep with ADLs.    Prior Function Level of Independence: Independent with assistive device(s)               Hand Dominance   Dominant Hand: Right    Extremity/Trunk Assessment   Upper Extremity Assessment: RUE deficits/detail;LUE deficits/detail RUE Deficits / Details: pt with minimal voluntary mvmt but full PROM     LUE Deficits / Details: pt flailing arm and using it purposefully,  ie. pull gown down   Lower Extremity Assessment: RLE deficits/detail;LLE deficits/detail RLE Deficits / Details: pt with minimal voluntary mvmt, unable to accurately assess LLE Deficits / Details: pt actively moving but unable to accurately assess  Cervical / Trunk Assessment: Normal  Communication   Communication: Receptive difficulties;Expressive difficulties  Cognition Arousal/Alertness:  Lethargic Behavior During Therapy: Restless Overall Cognitive Status: Impaired/Different from baseline Area of Impairment: Orientation;Attention;Following commands;Safety/judgement;Problem solving Orientation Level: Disoriented to;Place;Time;Situation Current Attention Level: Focused Memory: Decreased short-term memory Following Commands: Follows one step commands inconsistently Safety/Judgement: Decreased awareness of safety;Decreased awareness of deficits   Problem Solving: Slow processing;Requires verbal cues;Requires tactile cues;Difficulty sequencing;Decreased initiation General Comments: pt shakes head "yes" to everything. Patient turns head to her name but when asked if her name is Joyce Terry she also shakes head yes. Pt  non-verbal and unable to assist with task asked    General Comments General comments (skin integrity, edema, etc.): pt with skin break down and noted yeast at vaginia    Exercises        Assessment/Plan    PT Assessment Patient needs continued PT services  PT Diagnosis Difficulty walking;Hemiplegia dominant side;Altered mental status   PT Problem List Decreased strength;Decreased activity tolerance;Decreased range of motion;Decreased balance;Decreased mobility;Decreased coordination;Decreased cognition;Decreased knowledge of use of DME;Decreased safety awareness;Decreased knowledge of precautions;Impaired sensation  PT Treatment Interventions DME instruction;Gait training;Stair training;Functional mobility training;Therapeutic activities;Therapeutic exercise;Balance training;Neuromuscular re-education   PT Goals (Current goals can be found in the Care Plan section) Acute Rehab PT Goals Patient Stated Goal: unable to report PT Goal Formulation: Patient unable to participate in goal setting Time For Goal Achievement: 02/02/15 Potential to Achieve Goals: Good    Frequency Min 4X/week   Barriers to discharge Decreased caregiver support alone    Co-evaluation                End of Session   Activity Tolerance: Patient limited by pain;Treatment limited secondary to agitation Patient left: in bed;with call bell/phone within reach;with bed alarm set Nurse Communication: Mobility status (break down at peri area)         Time: 0383-3383 PT Time Calculation (min) (ACUTE ONLY): 21 min   Charges:   PT Evaluation $Initial PT Evaluation Tier I: 1 Procedure     PT G CodesKingsley Callander 01/20/2015, 9:22 AM   Kittie Plater, PT, DPT Pager #: [phone] Office #: [phone]

## 2015-01-21 ENCOUNTER — Inpatient Hospital Stay (HOSPITAL_COMMUNITY): Payer: Medicare Other

## 2015-01-21 DIAGNOSIS — N390 Urinary tract infection, site not specified: Secondary | ICD-10-CM

## 2015-01-21 DIAGNOSIS — I63012 Cerebral infarction due to thrombosis of left vertebral artery: Secondary | ICD-10-CM

## 2015-01-21 DIAGNOSIS — E0810 Diabetes mellitus due to underlying condition with ketoacidosis without coma: Secondary | ICD-10-CM

## 2015-01-21 DIAGNOSIS — I481 Persistent atrial fibrillation: Secondary | ICD-10-CM

## 2015-01-21 DIAGNOSIS — C680 Malignant neoplasm of urethra: Secondary | ICD-10-CM

## 2015-01-21 DIAGNOSIS — D509 Iron deficiency anemia, unspecified: Secondary | ICD-10-CM | POA: Insufficient documentation

## 2015-01-21 DIAGNOSIS — I639 Cerebral infarction, unspecified: Secondary | ICD-10-CM | POA: Insufficient documentation

## 2015-01-21 DIAGNOSIS — I6300 Cerebral infarction due to thrombosis of unspecified precerebral artery: Secondary | ICD-10-CM

## 2015-01-21 DIAGNOSIS — I6789 Other cerebrovascular disease: Secondary | ICD-10-CM

## 2015-01-21 LAB — GLUCOSE, CAPILLARY
GLUCOSE-CAPILLARY: 196 mg/dL — AB (ref 65–99)
GLUCOSE-CAPILLARY: 201 mg/dL — AB (ref 65–99)
GLUCOSE-CAPILLARY: 206 mg/dL — AB (ref 65–99)
GLUCOSE-CAPILLARY: 236 mg/dL — AB (ref 65–99)
GLUCOSE-CAPILLARY: 241 mg/dL — AB (ref 65–99)
GLUCOSE-CAPILLARY: 262 mg/dL — AB (ref 65–99)

## 2015-01-21 NOTE — Progress Notes (Signed)
Inpatient Diabetes Program Recommendations  AACE/ADA: New Consensus Statement on Inpatient Glycemic Control (2015)  Target Ranges:  Prepandial:   less than 140 mg/dL      Peak postprandial:   less than 180 mg/dL (1-2 hours)      Critically ill patients:  140 - 180 mg/dL   Review of Glycemic Control  Diabetes history: Diabetes Mellitus (see's Dr. Reynaldo Minium) Outpatient Diabetes medications: Lantus 15 units daily, Novolog 4-8 units tid with meals, Metformin, Diabeta Current orders for Inpatient glycemic control: Lantus 7 units QHS, Novolog Moderate Q4hrs  A1c 14.2% on 01/19/2015   Inpatient Diabetes Program Recommendations:    Blood sugars above inpatient goal still in the 100-300 range. May consider increasing Lantus to home dose of 15 units daily.   Thanks,  Tama Headings RN, MSN, [facility] Inpatient Diabetes Coordinator Team Pager [phone] (8a-5p)

## 2015-01-21 NOTE — Progress Notes (Signed)
STROKE TEAM PROGRESS NOTE   SUBJECTIVE (INTERVAL HISTORY) No family at the bedside. Patient lying in the bed. Non-communicative. RLE seems improved in strength. RUE still weak. Did not pass swallow.    OBJECTIVE Temp:  [97.4 F (36.3 C)-99.1 F (37.3 C)] 99.1 F (37.3 C) (10/26 0941) Pulse Rate:  [70-79] 75 (10/26 0538) Cardiac Rhythm:  [-] Ventricular paced (10/26 0840) Resp:  [15-18] 16 (10/26 0941) BP: (138-184)/(47-74) 138/47 mmHg (10/26 0941) SpO2:  [96 %-100 %] 98 % (10/26 0941)  CBC:   Recent Labs Lab 01/19/15 1230 01/19/15 1815  WBC 24.5* 20.6*  NEUTROABS 22.8*  --   HGB 9.9* 9.8*  HCT 33.8* 32.5*  MCV 71.9* 69.0*  PLT 312 578    Basic Metabolic Panel:   Recent Labs Lab 01/20/15 0114 01/20/15 0648  NA 135 132*  K 4.7 4.2  CL 101 99*  CO2 21* 22  GLUCOSE 93 244*  BUN 46* 49*  CREATININE 1.83* 2.04*  CALCIUM 9.1 8.6*    Lipid Panel:     Component Value Date/Time   CHOL 172 01/19/2015 1815   TRIG 401* 01/19/2015 1815   HDL 19* 01/19/2015 1815   CHOLHDL 9.1 01/19/2015 1815   VLDL UNABLE TO CALCULATE IF TRIGLYCERIDE OVER 400 mg/dL 01/19/2015 1815   LDLCALC UNABLE TO CALCULATE IF TRIGLYCERIDE OVER 400 mg/dL 01/19/2015 1815   HgbA1c:  Lab Results  Component Value Date   HGBA1C 14.2* 01/19/2015   Urine Drug Screen:     Component Value Date/Time   LABOPIA NONE DETECTED 01/19/2015 1230   COCAINSCRNUR NONE DETECTED 01/19/2015 1230   LABBENZ NONE DETECTED 01/19/2015 1230   AMPHETMU NONE DETECTED 01/19/2015 1230   THCU NONE DETECTED 01/19/2015 1230   LABBARB NONE DETECTED 01/19/2015 1230      IMAGING  Dg Chest 2 View 01/19/2015   Cardiomegaly.  No acute cardiopulmonary process.   Ct Head Wo Contrast 01/20/2015  IMPRESSION: Evolving, moderate-sized left MCA infarct. No intracranial hemorrhage.   01/19/2015   No acute abnormality. Atrophy and chronic microvascular ischemic change.   Ct Cerebral Perfusion W/cm 01/19/2015   Ischemia  throughout the posterior half left MCA territory. This ischemic area appears roughly divided between infarct and penumbra. No CTA but the left M1 is symmetrically patent.   CUS pending   2D echo pending    PHYSICAL EXAM  General - Well nourished, well developed, mild restless  Ophthalmologic - Fundi not visualized due to noncooperation.  Cardiovascular - Regular rate and rhythm, paced rhythm.  Mental Status -  Awake, alert, but global aphasia with right side neglect, not able to repeat or name.  Cranial Nerves II - XII - II - not blinking to visual threat on the right. III, IV, VI - left side gaze preference but able to cross midline. V - not cooperative on exam. VII - right facial droop. VIII, X, XI - not cooperative on exam XII - Tongue in midline in mouth.  Motor Strength - The patient's strength was RUE 2-/5 and RLE 3+/5, left UE and LE move spontaneously.  Bulk was normal and fasciculations were absent.   Motor Tone - Muscle tone was assessed at the neck and appendages and was normal.  Reflexes - The patient's reflexes were 1+ in all extremities and she had right babinski.  Sensory - Light touch, temperature/pinprick test not cooperative.    Coordination - not cooperative on exam.  Tremor was absent.  Gait and Station - not tested   ASSESSMENT/PLAN Ms.  Joyce Terry is a 79 y.o. female with history of diabetes type 2 insulin-dependent, atrial fibrillation on Coumadin but INR 1.12 on arrival, hyperlipidemia, hypertension, chronic systolic heart failure, urethral cancer with recurrence this year presenting with aphasia and right hemiplegia. She did not receive IV t-PA due to late arrival.   Stroke:  Left MCA infarct, embolic secondary to afib not complaint with medication  Resultant  Global aphaia, right hemiplegia  MRI  /  MRA  pacer  Carotid Doppler  pending   2D Echo  pending   LDL not able to calculate due to high TG  HgbA1c 14.2  subq heparin for VTE  prophylaxis Diet NPO time specified. ST assessed this am. Recommend other feeding alternatives.  warfarin daily but noncompliance prior to admission, now aspirin 300 mg suppository daily. For stroke prevention, she needs to be on anticoagulation. Due to moderate sized MCA infarct, we recommend start anticoagulation in 5-7 days. If pt options NOAC, will recommend eliquis in 5 days. But if resume warfarin, will recommend start in day 3 post stroke with expectation of therapeutic INR at 5-7 days. Bridging with ASA at the meantime. Do not start until pt able to swallow or has feeding tube.   Patient counseled to be compliant with her antithrombotic medications  Ongoing aggressive stroke risk factor management  Therapy recommendations:  pending  Disposition:  pending  Atrial Fibrillation  Home anticoagulation: discontinued in the hospital  INR 1.12 on admission, not complaint with medication  Due to moderate sized MCA infarct, we recommend start anticoagulation in 5-7 days. If pt option NOAC, will recommend eliquis in 5 days. But if resume warfarin, will recommend start in day 3 post stroke with expectation of therapeutic INR at 5-7 days. Bridging with ASA at hte meantime. Do not start until pt able to swallow or has feeding tube.     Hypertension  Stable Permissive hypertension (OK if < 180/105 due to end organ damage) but gradually normalize in 5-7 days.  Hyperlipidemia  Home meds:  Vytorin  LDL not able to calculate due to high TG, goal < 70  Resume home meds once pt access  Continue statin at discharge  Diabetes  HgbA1c 14.2, goal < 7.0  Uncontrolled  Need more aggressive glucose control  SSI   CBG monitoring  DM education  Dysphagia  Did not pass swallow  Need to consider PANDA or PEG  Other Stroke Risk Factors  Advanced age  Hx stroke/TIA  Coronary artery disease  Other Active Problems  CKD - Cre 2.04  Leukocytosis  Urethral cancer - f/u with  oncology  Hospital day # 2  Rosalin Hawking, MD PhD Stroke Neurology 01/21/2015 12:16 PM    To contact Stroke Continuity provider, please refer to [email or web address] After hours, contact General Neurology

## 2015-01-21 NOTE — Care Management Note (Addendum)
Case Management Note  Patient Details  Name: Joyce Terry MRN: [id number] Date of Birth: Dec 08, 1935  Subjective/Objective:                    Action/Plan: Patient recently transferred to [facility]. Patient lives at home alone. PT recommending CIR. CM will continue to follow for discharge needs.   Expected Discharge Date:         Expected Discharge Plan:     In-House Referral:     Discharge planning Services     Post Acute Care Choice:    Choice offered to:     DME Arranged:    DME Agency:     HH Arranged:    [place] Agency:     Status of Service:     Medicare Important Message Given:    Date Medicare IM Given:    Medicare IM give by:    Date Additional Medicare IM Given:    Additional Medicare Important Message give by:     If discussed at [place] of Stay Meetings, dates discussed:    Additional Comments:  Pollie Friar, RN 01/21/2015, 3:20 PM

## 2015-01-21 NOTE — Progress Notes (Signed)
Joyce Terry IFO:[id number] DOB: 05/21/1935 DOA: 01/19/2015 PCP: Geoffery Lyons, MD  Brief narrative: 79 y/o ? Atrial fibrillation Chad2vasc2 =4 on Coumadin Bradycardia + heart block status post PPM Prior GI bleed-history of internal hemorrhoids, small AVM cecum, left colonic diverticulosis HTN TY2 DM Known CAD status post stent mid LAD 01/2011. 02/2012 Transitional cell CA S/P TURP + stent placement 08/29/14-not candidate for cisplatin/cystectomy secondary to frailty and declined i overall condition as well as recent weight loss 60 pounds Lumbar degenerative disc disease  Admitted from home with aphasia and marked facial droop with right-sided weakness  On admission found to have WBC 20, hemoglobin 9, platelets 323, BUN/creatinine 0.7/2.3-baseline creatinine around 2  Past medical history-As per Problem list Chart reviewed as below-   Consultants:  Neuro  Procedures:  Ct head  Carotids  Echo p  Antibiotics:     Subjective   Sleepy arousabe but apparently given some anxiolytic last night Cannot obtain ROS Nursing reports no other concerns    Objective    Interim History:   Telemetry: nsr   Objective: Filed Vitals:   01/21/15 0127 01/21/15 0538 01/21/15 0941 01/21/15 1038  BP: 184/67 177/68 138/47   Pulse: 76 75  75  Temp: 97.5 F (36.4 C) 98.6 F (37 C) 99.1 F (37.3 C)   TempSrc: Axillary Oral Axillary   Resp: 15 17 16 16   Height:      Weight:      SpO2: 96% 97% 98% 99%   No intake or output data in the 24 hours ending 01/21/15 1255  Exam:  General: eomi, facial assym noted-non-coop c exam Cardiovascular: s1 s 2no m/r/g Respiratory: clear no added sound Abdomen: tender LLQ Skin nad Neuro sleepy-cannot examine  Data Reviewed: Basic Metabolic Panel:  Recent Labs Lab 01/19/15 1230 01/19/15 1815 01/19/15 2124 01/20/15 0114 01/20/15 0648  NA 128* 135 135 135 132*  K 5.7* 4.0 4.5 4.7 4.2  CL 92* 98* 101 101 99*  CO2 13*  20* 22 21* 22  GLUCOSE 794* 456* 196* 93 244*  BUN 47* 47* 47* 46* 49*  CREATININE 2.55* 2.30* 2.05* 1.83* 2.04*  CALCIUM 9.3 9.0 9.0 9.1 8.6*   Liver Function Tests:  Recent Labs Lab 01/19/15 1230  AST 17  ALT 13*  ALKPHOS 125  BILITOT 1.7*  PROT 5.8*  ALBUMIN 2.4*   No results for input(s): LIPASE, AMYLASE in the last 168 hours. No results for input(s): AMMONIA in the last 168 hours. CBC:  Recent Labs Lab 01/19/15 1230 01/19/15 1815  WBC 24.5* 20.6*  NEUTROABS 22.8*  --   HGB 9.9* 9.8*  HCT 33.8* 32.5*  MCV 71.9* 69.0*  PLT 312 323   Cardiac Enzymes:  Recent Labs Lab 01/19/15 1230  TROPONINI 0.03   BNP: Invalid input(s): POCBNP CBG:  Recent Labs Lab 01/20/15 2133 01/21/15 0003 01/21/15 0404 01/21/15 0743 01/21/15 1123  GLUCAP 177* 196* 236* 201* 241*    Recent Results (from the past 240 hour(s))  MRSA PCR Screening     Status: None   Collection Time: 01/19/15  5:12 PM  Result Value Ref Range Status   MRSA by PCR NEGATIVE NEGATIVE Final    Comment:        The GeneXpert MRSA Assay (FDA approved for NASAL specimens only), is one component of a comprehensive MRSA colonization surveillance program. It is not intended to diagnose MRSA infection nor to guide or monitor treatment for MRSA infections.      Studies:  All Imaging reviewed and is as per above notation   Scheduled Meds: .  stroke: mapping our early stages of recovery book   Does not apply Once  . antiseptic oral rinse  7 mL Mouth Rinse q12n4p  . aspirin  300 mg Rectal Daily  . chlorhexidine  15 mL Mouth Rinse BID  . ciprofloxacin  400 mg Intravenous Q24H  . clotrimazole  1 Applicatorful Vaginal QHS  . heparin  5,000 Units Subcutaneous 3 times per day  . insulin aspart  0-15 Units Subcutaneous Q4H  . insulin glargine  7 Units Subcutaneous QHS  . mometasone-formoterol  2 puff Inhalation BID   Continuous Infusions: . sodium chloride 50 mL/hr at 01/20/15 2140      Assessment/Plan:   1. Acute CVA, L MCA-Unable to give NOAC or coumadin.  Family state spatient would not have wanted aggressive medical care and aren't sure about temporary NG tube and want to wait to see mental status and swallow pattern.  They request palliative input.  Will ask SLP to re-evaluate am.  Continue Rectal ASA 300 mg daily--NO AC until taking PO or NG decision made 2. HTn Urgency-cannot take PO.  schedule hydralazine 5 mg iv q4 prn for Pressure above 150/90 3. Abd + L Hip pain-L hip xrays show no anomaly.   watch for dark/tarry stool. 4. Pyelonephritis-Ua small leuk, neg nitrites.  Reasonable Rx c Cipro for ~ 3 days then stop 5. DKA-found on admission-now resolved. 6. DM ty II-npo as failed swallow.  Q4 checks.  Continue saline 50 cc/hr  Glucose is 177-244 range.  COnt Lantus 7 U qhs and SSI.  Home meds include glyburide 5 am, metformin 1000 bid 7. Afib RVR Chad2vasc2=4-cannot keep on coumadin for now.  Will discuss moving forward.  On coreg 3.125 bid. 8. Transitional Ca bladder s/p TURB-non op candidate 9. Lumbar DDD-hold pain meds.  Stable med condition.  Hold Fentanyl 50 mcg q 72 10. Prior GI bleed-stable 11. Cad with stents 2012/2013-consider Losartan25 daily/Coreg 3.125 bid when able tot take PO   Appt with PCP: Requested Code Status: Full code/DNR Family Communication: d/w Maynor,Theresa Niece [phone]  [phone]  On 01/21/15 Disposition Plan: ?snf DVT prophylaxis: Lovenox Consultants: Neuro  Verneita Griffes, MD  [facility] Pager [phone] 01/21/2015, 12:55 PM    LOS: 2 days

## 2015-01-21 NOTE — Evaluation (Signed)
Occupational Therapy Evaluation Patient Details Name: Joyce Terry MRN: [id number] DOB: 07-09-1935 Today's Date: 01/21/2015    History of Present Illness Joyce Terry is a 79 y.o. female past medical history that includes, diabetes type 2 insulin-dependent, atrial fibrillation on Coumadin, hyperlipidemia, hypertension, chronic systolic heart failure, bladder cancer remotely was a recurrence this year, DVT presents to the emergency department with strokelike symptoms. Initial evaluation reveals DKA within an ion gap of 20 and CT cerebral perfusion scan revealing ischemia throughout the posterior half left MCA territory.   Clinical Impression   Pt was apparently living alone and independent prior to admission according to chart.  Presents with restlessness and lethargy with inability to follow commands and dependence in all ADL and mobility.  Pt will require post acute rehab.Will follow acutely.    Follow Up Recommendations  CIR;Supervision/Assistance - 24 hour    Equipment Recommendations  3 in 1 bedside comode;Wheelchair (measurements OT);Wheelchair cushion (measurements OT);Hospital bed    Recommendations for Other Services       Precautions / Restrictions Precautions Precautions: Fall Precaution Comments: global aphasia Restrictions Weight Bearing Restrictions: No      Mobility Bed Mobility Overal bed mobility: Needs Assistance;+2 for physical assistance;+ 2 for safety/equipment Bed Mobility: Rolling Rolling: Mod assist;Max assist         General bed mobility comments: mod assist to roll to L, max to R, pt actively resisting attempt assistance to EOB  Transfers                 General transfer comment: did not attempt due to safety concerns    Balance                                            ADL                                         General ADL Comments: Pt requiring total assistance for all ADL.      Vision Additional Comments: Unable to assess vision due to lethargy and only briefly opening eyes.   Perception     Praxis      Pertinent Vitals/Pain Pain Assessment: Faces Faces Pain Scale: No hurt     Hand Dominance Right   Extremity/Trunk Assessment Upper Extremity Assessment Upper Extremity Assessment: RUE deficits/detail;LUE deficits/detail RUE Deficits / Details: full PROM, grossly 2+/5 per observation of spontaneous movement RUE Coordination: decreased fine motor;decreased gross motor LUE Deficits / Details: attempting to pull off gown and reach for blanket, generalized weakness LUE Coordination: decreased fine motor;decreased gross motor   Lower Extremity Assessment Lower Extremity Assessment: Defer to PT evaluation       Communication Communication Communication: Receptive difficulties;Expressive difficulties   Cognition Arousal/Alertness: Lethargic Behavior During Therapy: Restless Overall Cognitive Status: Impaired/Different from baseline Area of Impairment: Attention;Following commands;Safety/judgement;Problem solving   Current Attention Level: Focused   Following Commands:  (does not follow commands) Safety/Judgement: Decreased awareness of safety;Decreased awareness of deficits   Problem Solving: Slow processing;Requires verbal cues;Requires tactile cues;Difficulty sequencing;Decreased initiation General Comments: Pt attempting to vocalize only.   General Comments       Exercises       Shoulder Instructions      Home Living Family/patient expects to be discharged to:: Unsure Living Arrangements:  Alone Available Help at Discharge: Family;Available PRN/intermittently Type of Home: House                           Additional Comments: per chart, niece reports pt to have lived alone and was indep with ADLs.      Prior Functioning/Environment Level of Independence: Independent with assistive device(s)             OT  Diagnosis: Generalized weakness;Cognitive deficits;Hemiplegia dominant side   OT Problem List: Decreased strength;Decreased activity tolerance;Impaired balance (sitting and/or standing);Decreased coordination;Decreased cognition;Decreased safety awareness;Decreased knowledge of use of DME or AE;Impaired UE functional use   OT Treatment/Interventions: Self-care/ADL training;Therapeutic activities;Cognitive remediation/compensation;Visual/perceptual remediation/compensation;Patient/family education;Neuromuscular education;Balance training    OT Goals(Current goals can be found in the care plan section) Acute Rehab OT Goals Patient Stated Goal: per RN, niece wants to take her home OT Goal Formulation: Patient unable to participate in goal setting Time For Goal Achievement: 02/04/15 Potential to Achieve Goals: Fair ADL Goals Pt Will Perform Grooming: with mod assist;sitting (face washing only) Additional ADL Goal #1: Pt will demonstrate sustained attention during ADL or therapeutic activities. Additional ADL Goal #2: Pt will participate in assessment of vision. Additional ADL Goal #3: Pt will follow one step commands 50% of requests. Additional ADL Goal #4: Pt will spontaneously use R UE as a functional assist during ADL.  OT Frequency: Min 2X/week   Barriers to D/C:            Co-evaluation              End of Session    Activity Tolerance: Patient limited by lethargy Patient left: in bed;with call bell/phone within reach;with bed alarm set   Time: 1543-1610 OT Time Calculation (min): 27 min Charges:  OT General Charges $OT Visit: 1 Procedure OT Evaluation $Initial OT Evaluation Tier I: 1 Procedure OT Treatments $Therapeutic Activity: 8-22 mins G-Codes:    Malka So 01/21/2015, 4:43 PM  [phone]

## 2015-01-21 NOTE — Progress Notes (Signed)
Speech Language Pathology Treatment: Cognitive-Linquistic  Patient Details Name: Joyce Terry MRN: [id number] DOB: May 05, 1935 Today's Date: 01/21/2015 Time: 9622-2979 SLP Time Calculation (min) (ACUTE ONLY): 20 min  Assessment / Plan / Recommendation Clinical Impression  Pt lethargic this am, difficult to arouse. SLP provided max stim, total assist for basic ADL's to promote arousal. Could not achieve focused attention. Suspect function limited by lingering sedating medications given in PM due to restlessness. RN reports that pt was maximally alert around 7pm and was attempting to speak in short familiar phrases with family. Given presentation yesterday, expect that this pt WILL BE ABLE TO SWALLOW when maximally alert and attentive to PO as there is no sign of neuromuscular dysphagia with trials. Safety is impacted by inattention and lack of awareness to PO. Strongly suggest that all effort be made to limit sedation in order to allow pt progression and avoid need for discussion of long term alternate nutrition if possible. Please page SLP at [phone] if pt demonstrates improvement in pm. If not, will plan to return tomorrow.    HPI Other Pertinent Information: Joyce Terry is a 79 y.o. female PMH, DM type 2 insulin-dependent, AFib on Coumadin, hyperlipidemia, HTN, chronic systolic heart failure, bladder cancer remotely was a recurrence this year, DVT. Daughter reports pt was previously independent with ADLs, pt found down at home on 01/19/15 presented to the ED with strokelike symptoms (expressive and receptive aphasia, right sided weakness). Initial evaluation reveals DKA within an ion gap of 20 and CT cerebral perfusion scan revealing ischemia throughout the posterior half of left MCA territory. This ischemic area appears roughly divided between infarct and penumbra. Thrombus noted in M2 branch.Per MD note, pt appears to have progressing dementia. SLP consulted for BSE and SLE.    Pertinent  Vitals Pain Assessment: Faces Faces Pain Scale: Hurts a little bit Pain Descriptors / Indicators: Moaning  SLP Plan  Continue with current plan of care    Recommendations Diet recommendations: NPO              General recommendations: Rehab consult Oral Care Recommendations: Oral care QID Follow up Recommendations: Inpatient Rehab Plan: Continue with current plan of care    GO     Joyce Terry, Joyce Terry 01/21/2015, 8:59 AM

## 2015-01-21 NOTE — Progress Notes (Signed)
  Echocardiogram 2D Echocardiogram has been performed.  Joyce Terry M 01/21/2015, 12:33 PM

## 2015-01-22 ENCOUNTER — Inpatient Hospital Stay (HOSPITAL_COMMUNITY): Payer: Medicare Other

## 2015-01-22 DIAGNOSIS — E872 Acidosis: Secondary | ICD-10-CM

## 2015-01-22 DIAGNOSIS — G8190 Hemiplegia, unspecified affecting unspecified side: Secondary | ICD-10-CM

## 2015-01-22 DIAGNOSIS — I63412 Cerebral infarction due to embolism of left middle cerebral artery: Principal | ICD-10-CM

## 2015-01-22 DIAGNOSIS — I63312 Cerebral infarction due to thrombosis of left middle cerebral artery: Secondary | ICD-10-CM

## 2015-01-22 DIAGNOSIS — I639 Cerebral infarction, unspecified: Secondary | ICD-10-CM

## 2015-01-22 DIAGNOSIS — I63019 Cerebral infarction due to thrombosis of unspecified vertebral artery: Secondary | ICD-10-CM

## 2015-01-22 DIAGNOSIS — I482 Chronic atrial fibrillation: Secondary | ICD-10-CM

## 2015-01-22 DIAGNOSIS — R4702 Dysphasia: Secondary | ICD-10-CM

## 2015-01-22 LAB — BASIC METABOLIC PANEL
Anion gap: 10 (ref 5–15)
BUN: 35 mg/dL — AB (ref 6–20)
CALCIUM: 9.2 mg/dL (ref 8.9–10.3)
CHLORIDE: 112 mmol/L — AB (ref 101–111)
CO2: 23 mmol/L (ref 22–32)
CREATININE: 1.7 mg/dL — AB (ref 0.44–1.00)
GFR calc Af Amer: 32 mL/min — ABNORMAL LOW (ref >60)
GFR calc non Af Amer: 28 mL/min — ABNORMAL LOW (ref >60)
Glucose, Bld: 215 mg/dL — ABNORMAL HIGH (ref 65–99)
Potassium: 3.5 mmol/L (ref 3.5–5.1)
Sodium: 145 mmol/L (ref 135–145)

## 2015-01-22 LAB — GLUCOSE, CAPILLARY
GLUCOSE-CAPILLARY: 149 mg/dL — AB (ref 65–99)
GLUCOSE-CAPILLARY: 176 mg/dL — AB (ref 65–99)
Glucose-Capillary: 193 mg/dL — ABNORMAL HIGH (ref 65–99)
Glucose-Capillary: 248 mg/dL — ABNORMAL HIGH (ref 65–99)
Glucose-Capillary: 295 mg/dL — ABNORMAL HIGH (ref 65–99)
Glucose-Capillary: 253 mg/dL — ABNORMAL HIGH (ref 65–99)

## 2015-01-22 MED ORDER — METFORMIN HCL 500 MG PO TABS: 1000.0000 mg | ORAL_TABLET | Freq: Two times a day (BID) | ORAL | Status: DC

## 2015-01-22 MED ORDER — ASPIRIN 325 MG PO TABS
325.0000 mg | ORAL_TABLET | Freq: Every day | ORAL | Status: DC
  Administered 2015-01-22 – 2015-01-23 (×2): 325 mg via ORAL
  Filled 2015-01-22 (×3): qty 1

## 2015-01-22 MED ORDER — WHITE PETROLATUM GEL
Status: AC
  Administered 2015-01-22: 0.2
  Filled 2015-01-22: qty 1

## 2015-01-22 MED ORDER — ZOLPIDEM TARTRATE 5 MG PO TABS: 5.0000 mg | ORAL_TABLET | Freq: Every evening | ORAL | Status: DC | PRN

## 2015-01-22 MED ORDER — ROPINIROLE HCL 0.25 MG PO TABS
0.2500 mg | ORAL_TABLET | Freq: Every day | ORAL | Status: DC
  Administered 2015-01-22 – 2015-01-23 (×2): 0.25 mg via ORAL
  Filled 2015-01-22 (×3): qty 1

## 2015-01-22 MED ORDER — RESOURCE THICKENUP CLEAR PO POWD
ORAL | Status: DC | PRN
  Filled 2015-01-22: qty 125

## 2015-01-22 MED ORDER — CARVEDILOL 3.125 MG PO TABS
3.1250 mg | ORAL_TABLET | Freq: Two times a day (BID) | ORAL | Status: DC
  Administered 2015-01-22 – 2015-01-23 (×2): 3.125 mg via ORAL
  Filled 2015-01-22 (×2): qty 1

## 2015-01-22 MED ORDER — ESCITALOPRAM OXALATE 10 MG PO TABS
10.0000 mg | ORAL_TABLET | Freq: Every day | ORAL | Status: DC
Start: 2015-01-22 — End: 2015-01-24
  Administered 2015-01-22 – 2015-01-23 (×2): 10 mg via ORAL
  Filled 2015-01-22 (×3): qty 1

## 2015-01-22 MED ORDER — EZETIMIBE-SIMVASTATIN 10-20 MG PO TABS
1.0000 | ORAL_TABLET | Freq: Every day | ORAL | Status: DC
  Administered 2015-01-22 – 2015-01-23 (×2): 1 via ORAL
  Filled 2015-01-22 (×5): qty 1

## 2015-01-22 NOTE — Progress Notes (Addendum)
Joyce Terry NAT:[id number] DOB: March 31, 1935 DOA: 01/19/2015 PCP: Geoffery Lyons, MD  Brief narrative: 79 y/o ? Atrial fibrillation Chad2vasc2 =4 on Coumadin Bradycardia + heart block status post PPM Prior GI bleed-history of internal hemorrhoids, small AVM cecum, left colonic diverticulosis HTN TY2 DM Known CAD status post stent mid LAD 01/2011. 02/2012 Transitional cell CA S/P TURP + stent placement 08/29/14-not candidate for cisplatin/cystectomy secondary to frailty and declined i overall condition as well as recent weight loss 60 pounds Lumbar degenerative disc disease  Admitted from home with aphasia and marked facial droop with right-sided weakness  On admission found to have WBC 20, hemoglobin 9, platelets 323, BUN/creatinine 0.7/2.3-baseline creatinine around 2  Past medical history-As per Problem list Chart reviewed as below-   Consultants:  Neuro  Procedures:  Ct head  Carotids  Echo p  Antibiotics:     Subjective   More alert and awake.  refusing tele, clothing and agitated per RN Had MBS and upgraded to dys 1 diet    Objective    Interim History:   Telemetry: nsr   Objective: Filed Vitals:   01/21/15 2115 01/22/15 0109 01/22/15 0515 01/22/15 1034  BP: 175/70 166/60 174/66 162/68  Pulse: 70 73 88 70  Temp: 98 F (36.7 C) 97.9 F (36.6 C) 98 F (36.7 C) 98.3 F (36.8 C)  TempSrc: Axillary Axillary Axillary Axillary  Resp: 16 16 16 18   Height:      Weight:      SpO2: 96% 96% 97% 98%    Intake/Output Summary (Last 24 hours) at 01/22/15 1339 Last data filed at 01/22/15 1100  Gross per 24 hour  Intake      0 ml  Output      0 ml  Net      0 ml    Exam:  General: eomi, facial assym noted-non-coop c exam Cardiovascular: s1 s 2no m/r/g Respiratory: clear no added sound Abdomen: tender LLQ Skin nad Neuro sleepy-cannot examine fully.  Moving 4 limbs more however  Data Reviewed: Basic Metabolic Panel:  Recent Labs Lab  01/19/15 1815 01/19/15 2124 01/20/15 0114 01/20/15 0648 01/22/15 1015  NA 135 135 135 132* 145  K 4.0 4.5 4.7 4.2 3.5  CL 98* 101 101 99* 112*  CO2 20* 22 21* 22 23  GLUCOSE 456* 196* 93 244* 215*  BUN 47* 47* 46* 49* 35*  CREATININE 2.30* 2.05* 1.83* 2.04* 1.70*  CALCIUM 9.0 9.0 9.1 8.6* 9.2   Liver Function Tests:  Recent Labs Lab 01/19/15 1230  AST 17  ALT 13*  ALKPHOS 125  BILITOT 1.7*  PROT 5.8*  ALBUMIN 2.4*   No results for input(s): LIPASE, AMYLASE in the last 168 hours. No results for input(s): AMMONIA in the last 168 hours. CBC:  Recent Labs Lab 01/19/15 1230 01/19/15 1815  WBC 24.5* 20.6*  NEUTROABS 22.8*  --   HGB 9.9* 9.8*  HCT 33.8* 32.5*  MCV 71.9* 69.0*  PLT 312 323   Cardiac Enzymes:  Recent Labs Lab 01/19/15 1230  TROPONINI 0.03   BNP: Invalid input(s): POCBNP CBG:  Recent Labs Lab 01/21/15 1954 01/22/15 0009 01/22/15 0352 01/22/15 0740 01/22/15 1144  GLUCAP 206* 149* 176* 193* 248*    Recent Results (from the past 240 hour(s))  MRSA PCR Screening     Status: None   Collection Time: 01/19/15  5:12 PM  Result Value Ref Range Status   MRSA by PCR NEGATIVE NEGATIVE Final    Comment:  The GeneXpert MRSA Assay (FDA approved for NASAL specimens only), is one component of a comprehensive MRSA colonization surveillance program. It is not intended to diagnose MRSA infection nor to guide or monitor treatment for MRSA infections.      Studies:              All Imaging reviewed and is as per above notation   Scheduled Meds: .  stroke: mapping our early stages of recovery book   Does not apply Once  . antiseptic oral rinse  7 mL Mouth Rinse q12n4p  . aspirin  300 mg Rectal Daily  . chlorhexidine  15 mL Mouth Rinse BID  . ciprofloxacin  400 mg Intravenous Q24H  . clotrimazole  1 Applicatorful Vaginal QHS  . heparin  5,000 Units Subcutaneous 3 times per day  . insulin aspart  0-15 Units Subcutaneous Q4H  . insulin  glargine  7 Units Subcutaneous QHS  . mometasone-formoterol  2 puff Inhalation BID   Continuous Infusions: . sodium chloride 50 mL/hr at 01/20/15 2140     Assessment/Plan:   1. Acute CVA, L MCA-Unable to give NOAC or coumadin.  Family state spatient would not have wanted aggressive medical care.  Passed MBS awaiting palliative input.  --NO AC until 10/29 given size of infarct.  Will try to add back po meds and monitor for tolerance.  Would change to NOAC > coumadin given ease of administration 2. Dysphagia-SLP graduated to Dys 1 diet.  Monitor for cough and signs aspiraiton 3. HTn Urgency  schedule hydralazine 5 mg iv q4 prn for Pressure above 150/90-added back Coreg 3.125 bid 4. AKI/acute renal failure on admission-bun/creat 47/2.55-->35/1.77.  Keep on saline for now and monitor po intake.  Labs am 5. Abd + L Hip pain-L hip xrays show no anomaly.   watch for dark/tarry stool. 6. Pyelonephritis-Ua small leuk, neg nitrites.  stopped Cipro after 3 days 10/27 7. DKA-found on admission-now resolved. 8. DM ty II-npo as failed swallow.  Q4 checks.  Continue saline 50 cc/hr  Glucose is 176-248 range.  COnt Lantus 7 U qhs and SSI.  Home meds include glyburide 5 am, metformin 1000 bid 9. Afib RVR Chad2vasc2=4-cannot keep on coumadin for now-->NOAC on 10/29.  Start ASA po 10/27 as a bridge for now.  On coreg 3.125 bid. 10. Transitional Ca bladder s/p TURB-non-op candidate 11. Lumbar DDD-hold pain meds.  Stable med condition.  Hold Fentanyl 50 mcg q 72 12. Prior GI bleed-stable 13. Cad with stents 2012/2013-consider Losartan25 daily/Coreg 3.125 bid when able tot take PO  Code Status: DNR Family Communication: no family +.  pallaitive to discuss with famiyl today overall options Disposition Plan: ? CIR once PT eval complete DVT prophylaxis: Lovenox Consultants: Neuro  Verneita Griffes, MD  [facility] Pager [phone] 01/22/2015, 1:39 PM    LOS: 3 days

## 2015-01-22 NOTE — Progress Notes (Signed)
MBSS   HPI:  Other Pertinent Information: Joyce Terry is a 79 y.o. female PMH, DM type 2 insulin-dependent, AFib on Coumadin, hyperlipidemia, HTN, chronic systolic heart failure, bladder cancer remotely was a recurrence this year, DVT. Daughter reports pt was previously independent with ADLs, pt found down at home on 01/19/15 presented to the ED with strokelike symptoms (expressive and receptive aphasia, right sided weakness). Initial evaluation reveals DKA within an ion gap of 20 and CT cerebral perfusion scan revealing ischemia throughout the posterior half of left MCA territory. This ischemic area appears roughly divided between infarct and penumbra. Thrombus noted in M2 branch.Per MD note, pt appears to have progressing dementia. Pt has language deficits and dysphagia as a result of her cva.  MBS indicated to determine readiness for po intake.      Assessment / Plan / Recommendation CHL IP CLINICAL IMPRESSIONS 01/22/2015  Therapy Diagnosis Mild oral phase dysphagia;Mild pharyngeal phase dysphagia  Clinical Impression Testing was limited due to decreased participation from patient.  She did not want to lay on her back and when repositioned for testing, she flailed her arms, yelling out.  SLP was able to view pt swallow only a tsp of pudding and bolus of nectar.   Pt with delayed oral transiting and minimal oral residuals.  Pharyngeal swallow mildly delayed but strong without residuals.    No aspiration present and pt's agitation negatively impacted swallowing test.  Coupling results of clinical testing at bedside and MBS, recommend conservative diet of dys1/honey via tsp and allow single ice chips with strict precautions.       CHL IP TREATMENT RECOMMENDATION 01/20/2015  Treatment Recommendations Therapy as outlined in treatment plan below     CHL IP DIET RECOMMENDATION 01/22/2015  SLP Diet Recommendations Ice chips PRN after oral care;Dysphagia 1 (Puree);Honey  Liquid Administration via  Tsp only initially   Medication Administration Crushed with puree  Compensations Small sips/bites;Slow rate;Check for pocketing  Postural Changes and/or Swallow Maneuvers Upright, 90*     CHL IP OTHER RECOMMENDATIONS 01/22/2015  Recommended Consults (None)  Oral Care Recommendations Oral care before and after PO  Other Recommendations Order thickener from pharmacy;Prohibited food (jello, ice cream, thin soups)     CHL IP FOLLOW UP RECOMMENDATIONS 01/21/2015  Follow up Recommendations Inpatient Rehab     CHL IP FREQUENCY AND DURATION 01/22/2015  Speech Therapy Frequency (ACUTE ONLY) min 2x/week  Treatment Duration 2 weeks         CHL IP REASON FOR REFERRAL 01/22/2015  Reason for Referral Objectively evaluate swallowing function     CHL IP ORAL PHASE 01/22/2015  Oral Phase Impaired      CHL IP PHARYNGEAL PHASE 01/22/2015  Pharyngeal Phase Impaired      CHL IP CERVICAL ESOPHAGEAL PHASE 01/22/2015  Cervical Esophageal Phase [facility]            Luanna Salk, [place] [facility] SLP [phone]

## 2015-01-22 NOTE — Progress Notes (Signed)
ANTIBIOTIC CONSULT NOTE - FOLLOW UP  Pharmacy Consult for cipro Indication: UTI  Allergies  Allergen Reactions  . Demerol Nausea And Vomiting  . Penicillins Hives  . Tape Other (See Comments)    "surgical tape tears skin up; paper tape is ok"  . Morphine And Related Itching  . Codeine Nausea And Vomiting    Patient Measurements: Height: 5\' 7"  (170.2 cm) Weight: 180 lb 5.4 oz (81.8 kg) IBW/kg (Calculated) : 61.6   Vital Signs: Temp: 98.3 F (36.8 C) (10/27 1034) Temp Source: Axillary (10/27 1034) BP: 162/68 mmHg (10/27 1034) Pulse Rate: 70 (10/27 1034) Intake/Output from previous day:   Intake/Output from this shift:    Labs:  Recent Labs  01/19/15 1230 01/19/15 1815  01/20/15 0114 01/20/15 0648 01/22/15 1015  WBC 24.5* 20.6*  --   --   --   --   HGB 9.9* 9.8*  --   --   --   --   PLT 312 323  --   --   --   --   CREATININE 2.55* 2.30*  < > 1.83* 2.04* 1.70*  < > = values in this interval not displayed. Estimated Creatinine Clearance: 30.5 mL/min (by C-G formula based on Cr of 1.7). No results for input(s): VANCOTROUGH, VANCOPEAK, VANCORANDOM, GENTTROUGH, GENTPEAK, GENTRANDOM, TOBRATROUGH, TOBRAPEAK, TOBRARND, AMIKACINPEAK, AMIKACINTROU, AMIKACIN in the last 72 hours.   Microbiology: Recent Results (from the past 720 hour(s))  MRSA PCR Screening     Status: None   Collection Time: 01/19/15  5:12 PM  Result Value Ref Range Status   MRSA by PCR NEGATIVE NEGATIVE Final    Comment:        The GeneXpert MRSA Assay (FDA approved for NASAL specimens only), is one component of a comprehensive MRSA colonization surveillance program. It is not intended to diagnose MRSA infection nor to guide or monitor treatment for MRSA infections.     Anti-infectives    Start     Dose/Rate Route Frequency Ordered Stop   01/20/15 1430  fluconazole (DIFLUCAN) tablet 150 mg  Status:  Discontinued     150 mg Oral Daily 01/20/15 1334 01/20/15 1500   01/19/15 1800   ciprofloxacin (CIPRO) IVPB 400 mg     400 mg 200 mL/hr over 60 Minutes Intravenous Every 24 hours 01/19/15 1731        Assessment: 79 yo female with UTI and also noted with R ureteral stent on cipro. She is afebrile, SCr= 1.7 and CrCl ~ 30. Noted plans for 3 days of cipro and also noted for concern of pyelonephritis.    10/24 cipro>>  Plan:  -No cipro dose changes needed -If treating for pyelonephritis consider treatment for 7 days?   Thank you, Hildred Laser, Pharm D 01/22/2015 12:13 PM

## 2015-01-22 NOTE — Progress Notes (Signed)
Physical Therapy Treatment Patient Details Name: Joyce Terry MRN: [id number] DOB: Aug 01, 1935 Today's Date: 01/22/2015    History of Present Illness Joyce Terry is a 79 y.o. female past medical history that includes, diabetes type 2 insulin-dependent, atrial fibrillation on Coumadin, hyperlipidemia, hypertension, chronic systolic heart failure, bladder cancer remotely was a recurrence this year, DVT presents to the emergency department with strokelike symptoms. Initial evaluation reveals DKA within an ion gap of 20 and CT cerebral perfusion scan revealing ischemia throughout the posterior half left MCA territory.    PT Comments    Noted earlier today pt was restless and yelling out partial phrases/sentences. This pm she would only state "Joyce Terry" twice and "don't do that!" Restless behavior with hints of aggression noted while working on bed mobility and sitting balance. This appears different from prior sessions. Will continue to work with pt and assist with d/c plans.   Follow Up Recommendations  CIR (monitor; if participation does not improve, will not qualify)     Equipment Recommendations  Other (comment) (TBD)    Recommendations for Other Services       Precautions / Restrictions Precautions Precautions: Fall Precaution Comments: global aphasia Restrictions Weight Bearing Restrictions: No    Mobility  Bed Mobility Overal bed mobility: Needs Assistance Bed Mobility: Rolling;Sidelying to Sit;Sit to Sidelying Rolling: Supervision Sidelying to sit: Mod assist     Sit to sidelying: Min assist General bed mobility comments: Cooperated with bed mobility to roll onto her back and then later to her Rt side. Initiallly resisted sitting at EOB (pulling LLE back onto bed, RLE too weak and needed assist). Ultimately with gestural cues, she reached out her LUE to PT and pulled herself up to sitting (pulling against PT's arm). Pt impulsively returned to sidelying on Rt with  assist to raise RLE  Transfers                 General transfer comment: pt would not attempt  Ambulation/Gait                 Stairs            Wheelchair Mobility    Modified Rankin (Stroke Patients Only) Modified Rankin (Stroke Patients Only) Pre-Morbid Rankin Score: No significant disability Modified Rankin: Severe disability     Balance   Sitting-balance support: Bilateral upper extremity supported Sitting balance-Leahy Scale: Poor Sitting balance - Comments: maintained midline without external support (however bil UE support) x 15 seconds and returned to sidelying                            Cognition Arousal/Alertness: Lethargic (vs willful ignoring ) Behavior During Therapy: Restless;Agitated Overall Cognitive Status: No family/caregiver present to determine baseline cognitive functioning Area of Impairment: Orientation;Attention;Following commands;Safety/judgement Orientation Level: Disoriented to;Place;Time;Situation (could state [name] with incr time; repeated ?) Current Attention Level: Focused   Following Commands: Follows one step commands inconsistently Safety/Judgement: Decreased awareness of safety;Decreased awareness of deficits     General Comments: Patient ?asleep on arrival (staff just leaving room after working with her). Maintained eyes closed and resisted donning gown to cover her (ultimately did cooperate with putting arm through sleeve). Cooperated with bed mobility with gestural cues and incr time/encouragement    Exercises      General Comments        Pertinent Vitals/Pain Pain Assessment: Faces Faces Pain Scale: Hurts even more Pain Location: ?RLE Pain Descriptors / Indicators: Grimacing;Guarding;Restless (  yelling) Pain Intervention(s): Limited activity within patient's tolerance;Monitored during session;Repositioned    Home Living                      Prior Function            PT Goals  (current goals can now be found in the care plan section) Acute Rehab PT Goals Patient Stated Goal: unable to report Time For Goal Achievement: 02/02/15 Progress towards PT goals: Progressing toward goals (1 person assist to EOB)    Frequency  Min 4X/week    PT Plan Current plan remains appropriate    Co-evaluation             End of Session   Activity Tolerance: Treatment limited secondary to agitation Patient left: in bed;with call bell/phone within reach;with bed alarm set     Time: 6384-6659 PT Time Calculation (min) (ACUTE ONLY): 13 min  Charges:  $Therapeutic Activity: 8-22 mins                    G Codes:      Joyce Terry Feb 03, 2015, 2:52 PM Pager [phone]

## 2015-01-22 NOTE — Progress Notes (Signed)
PT Cancellation Note  Patient Details Name: Joyce Terry MRN: [id number] DOB: 09-21-1935   Cancelled Treatment:    Reason Eval/Treat Not Completed: Patient at procedure or test/unavailable   Initial attempt with SLP. Now off the floor testing. Will reattempt as schedule permits   Taralynn Quiett 01/22/2015, 9:08 AM  Pager [phone]

## 2015-01-22 NOTE — Progress Notes (Signed)
VASCULAR LAB PRELIMINARY  PRELIMINARY  PRELIMINARY  PRELIMINARY  Right carotid duplex completed.    Preliminary report:  1-39% right ICA plaquing.  Vertebral artery flow is antegrade.  Unable to scan left side secondary to patient confusion, movement, and uncooperativeness.  Krissa Utke, RVT 01/22/2015, 9:36 AM

## 2015-01-22 NOTE — Care Management Important Message (Signed)
Important Message  Patient Details  Name: Joyce Terry MRN: [id number] Date of Birth: 05/06/1935   Medicare Important Message Given:  Yes-second notification given    Nathen May 01/22/2015, 10:40 AM

## 2015-01-22 NOTE — Progress Notes (Addendum)
Speech Language Pathology Treatment: Dysphagia  Patient Details Name: Joyce Terry MRN: [id number] DOB: [birth year] Today's Date: 01/22/2015 Time: 7622-6333 SLP Time Calculation (min) (ACUTE ONLY): 37 min  Assessment / Plan / Recommendation Clinical Impression  Pt demonstrating improvement in mentation, expressive communication and swallowing abilities.  She is verbalizing comprehensible expression with context known, although paraphasia continue.    SLP set up oral suction for pt and provided mouth care prior to providing po.  Various consistencies provided including ice chips, tsps water, applesauce and nectar thick liquids.  Pt with decreased cooperation - ? Impact of language deficits and required significant cueing for po acceptance.  Decreased oral coordination noted with overt coughing immediately post swallow of tsps water, nectar via straw.  Nectar via tsp and applesauce tolerated without overt airway compromise, however pt unhappy with tsp amounts and reaching toward cup stating "more".  Pt clearly with decreased comprehension of ramifications of aspiration unfortunately.    Pt is impulsive and takes very large boluses - which will increase her aspiration risk.  Suspect primary oral deficits but can not rule out pharyngeal deficit and therefore rec MBS prior to po initiation.  MD and RN made aware.     HPI Other Pertinent Information: Joyce Terry is a 79 y.o. female PMH, DM type 2 insulin-dependent, AFib on Coumadin, hyperlipidemia, HTN, chronic systolic heart failure, bladder cancer remotely was a recurrence this year, DVT. Daughter reports pt was previously independent with ADLs, pt found down at home on 01/19/15 presented to the ED with strokelike symptoms (expressive and receptive aphasia, right sided weakness). Initial evaluation reveals DKA within an ion gap of 20 and CT cerebral perfusion scan revealing ischemia throughout the posterior half of left MCA territory. This ischemic  area appears roughly divided between infarct and penumbra. Thrombus noted in M2 branch.Per MD note, pt appears to have progressing dementia. SLP consulted for BSE and SLE.    Pertinent Vitals Pain Assessment: Faces  SLP Plan  MBS    Recommendations Diet recommendations: NPO Medication Administration: Via alternative means              Oral Care Recommendations: Oral care QID Plan: [place], [place] [facility] SLP [phone]

## 2015-01-22 NOTE — Consult Note (Signed)
Physical Medicine and Rehabilitation Consult Reason for Consult:Embolic left MCA Referring Physician: [facility]   HPI: Joyce Terry is a 79 y.o. right handed female with history of hypertension, diabetes mellitus peripheral neuropathy, systolic congestive heart failure, coronary artery disease/pacemaker with PTCA, atrial fibrillation on chronic Coumadin and questionable medical noncompliance. Per chart patient lives alone independently with assistive device prior to admission. Admitted 01/19/2015 with aphasia and right-sided weakness. CT of the head showed evolving moderate-sized left MCA infarct without hemorrhage. INR on admission 1.12. Echocardiogram with ejection fraction of 50% hypokinesis of the infero septal myocardium. Carotid Dopplers with no ICA stenosis. Patient did not receive TPA. Neurology consulted presently on aspirin for CVA prophylaxis and await plan for Coumadin versus Eliquis as well as subcutaneous heparin for DVT prophylaxis. Dysphagia 1 honey thick liquid diet. Physical and occupational therapy evaluations completed with recommendations of physical medicine rehabilitation consult.   Review of Systems  Unable to perform ROS: language   Past Medical History  Diagnosis Date  . Hypertension   . Atrial fibrillation ([place])   . Obesity   . Coronary artery disease     cardiac catheterization in 1997 -- Est. EF of 50%  . Fatigue   . Stress   . Chest pain   . Peptic ulcer 1985  . GERD (gastroesophageal reflux disease)   . Depression   . Sinoatrial node dysfunction (HCC)   . Gastric polyps   . Colon polyps 05/19/2009    Tubular Adenomous polyps  . Diverticulosis   . Endometrial cancer ([place]) 1995  . Hypercholesteremia   . DVT (deep venous thrombosis) ([place]) 1960's    ?RLE (03/16/2012)  . Exertional dyspnea     "sometimes" (03/16/2012)  . Type II diabetes mellitus ([place])   . Anemia     "iron transfusions ?twice" (03/16/2012)  . Internal hemorrhoid, bleeding    "sometimes" (03/16/2012)  . Migraines     "when I was going thru menopause; none lately" (03/16/2012)  . Arthritis     "qwhere" (03/16/2012)  . Chronic back pain   . Angina     occasional, none recent  . Presence of permanent cardiac pacemaker   . CHF (congestive heart failure) (HCC)     chronic systolic CHF  . Pneumonia 1991, 2012    x2  . 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 Hydronephrosis, right   . History of atrial fibrillation   . Hx of cardiac arrhythmia   . Hx of esophageal reflux   . DKA (diabetic ketoacidoses) ([place])   . CVA (cerebral infarction)   . CKD (chronic kidney disease), stage II    Past Surgical History  Procedure Laterality Date  . Vesicovaginal fistula closure w/ tah  1995  . Joint replacement      LEFT HIP   02/2008  . Cholecystectomy  fall 2001  . Cardiac catheterization  02/21/96; 03/16/2012    Est. EF of 50%; "just to look" (03/16/2012)  . Coronary angioplasty with stent placement  02/15/11    "one"  . Coronary angioplasty  03/16/2012    "couldn't put stent in cause of where it is" (03/16/2012)  . Tonsillectomy  ~ 1962  . Total hip arthroplasty  02/2008    "left" (03/16/2012)  . Insert / replace / remove pacemaker  07/23/99    atrial insertion of the AV node -- Medtronic single chamber pacemaker   . Insert / replace / remove pacemaker  2012    "replaced" (03/16/2012)  . Ptca  02/2012    Balloon angioplast of jailed diagonal branch by prior LAD stent  . Percutaneous coronary stent intervention (pci-s) N/A 02/15/2011    Procedure: PERCUTANEOUS CORONARY STENT INTERVENTION (PCI-S);  Surgeon: Burnell Blanks, MD;  [facility] CATH LAB;  Service: Cardiovascular;  Laterality: N/A;  . Pacemaker generator change N/A 03/02/2011    Procedure: PACEMAKER GENERATOR CHANGE;  Surgeon: Evans Lance, MD;  Location: [facility] CATH LAB;  Service: Cardiovascular;  Laterality: N/A;  . Left heart catheterization with coronary angiogram N/A 03/16/2012    Procedure:  LEFT HEART CATHETERIZATION WITH CORONARY ANGIOGRAM;  Surgeon: Thayer Headings, MD;  Location: [facility] CATH LAB;  Service: Cardiovascular;  Laterality: N/A;  . Percutaneous coronary stent intervention (pci-s) N/A 03/16/2012    Procedure: PERCUTANEOUS CORONARY STENT INTERVENTION (PCI-S);  Surgeon: Burnell Blanks, MD;  Location: [facility] CATH LAB;  Service: Cardiovascular;  Laterality: N/A;  . Abdominal hysterectomy  1995    "endometrial cancer" (03/16/2012) total   . Dilation and curettage of uterus  late 1950's    "had probably 5; several miscarriages when I was younger"  . Transurethral resection of bladder tumor with gyrus (turbt-gyrus) N/A 08/29/2014    Procedure: TRANSURETHRAL RESECTION OF BLADDER TUMOR WITH GYRUS (TURBT-GYRUS);  Surgeon: Ardis Hughs, MD;  Location: [facility] ORS;  Service: Urology;  Laterality: N/A;  . Cystoscopy w/ ureteral stent placement Bilateral 08/29/2014    Procedure: BILATERAL RETROGRADE PYELOGRAM /RIGHT URETERAL STENT PLACEMENT;  Surgeon: Ardis Hughs, MD;  Location: [facility] ORS;  Service: Urology;  Laterality: Bilateral;   Family History  Problem Relation Age of Onset  . Heart disease Mother   . Hypertension Mother    Social History:  reports that she has quit smoking. Her smoking use included Cigarettes. She started smoking about 25 years ago. She has a 35 pack-year smoking history. She has never used smokeless tobacco. She reports that she drinks alcohol. She reports that she does not use illicit drugs. Allergies:  Allergies  Allergen Reactions  . Demerol Nausea And Vomiting  . Penicillins Hives  . Tape Other (See Comments)    "surgical tape tears skin up; paper tape is ok"  . Morphine And Related Itching  . Codeine Nausea And Vomiting   Medications Prior to Admission  Medication Sig Dispense Refill  . B-D UF III MINI PEN NEEDLES 31G X 5 MM MISC   0  . calcium-vitamin D (OSCAL WITH D) 500-200 MG-UNIT per tablet Take 1 tablet by mouth 2 (two) times daily.  (Patient taking differently: Take 1 tablet by mouth daily with breakfast. )    . carvedilol (COREG) 3.125 MG tablet Take 1 tablet (3.125 mg total) by mouth 2 (two) times daily with a meal. 60 tablet 11  . docusate sodium (COLACE) 100 MG capsule Take 1 capsule (100 mg total) by mouth 2 (two) times daily as needed (take to keep stool soft.). 60 capsule 0  . escitalopram (LEXAPRO) 10 MG tablet Take 10 mg by mouth daily.      Marland Kitchen ezetimibe-simvastatin (VYTORIN) 10-20 MG per tablet Take 1 tablet by mouth at bedtime.     . fentaNYL (DURAGESIC - DOSED MCG/HR) 50 MCG/HR Place 1 patch onto the skin every 3 (three) days.    . Fluticasone-Salmeterol (ADVAIR) 250-50 MCG/DOSE AEPB Inhale 1 puff into the lungs every 12 (twelve) hours as needed. For shortness of breath     . glyBURIDE (DIABETA) 5 MG tablet Take 5 mg by mouth daily with breakfast.    .  insulin aspart (NOVOLOG) 100 UNIT/ML injection Inject 4-8 Units into the skin 2 (two) times daily. If blood sugar>250, use 7-8 units. If if <200, use 4-5 units     . insulin glargine (LANTUS) 100 UNIT/ML injection Inject 0.15 mLs (15 Units total) into the skin at bedtime. (Patient taking differently: Inject 10-20 Units into the skin at bedtime. Sliding scale) 10 mL 11  . losartan (COZAAR) 25 MG tablet take 1 tablet by mouth once daily 30 tablet 10  . metFORMIN (GLUCOPHAGE) 500 MG tablet Take 1,000 mg by mouth 2 (two) times daily with a meal.    . OVER THE COUNTER MEDICATION Place 1-2 drops into both eyes daily as needed. For dry/itchy eyes --saline drops     . oxyCODONE-acetaminophen (PERCOCET) 10-325 MG per tablet Take 1 tablet by mouth every 4 (four) hours as needed. For pain 30 tablet 0  . pantoprazole (PROTONIX) 40 MG tablet Take 40 mg by mouth 2 (two) times daily.     . phenazopyridine (PYRIDIUM) 200 MG tablet Take 1 tablet (200 mg total) by mouth 3 (three) times daily as needed for pain. For urinary burning Note: will stain clothing 30 tablet 3  . rOPINIRole  (REQUIP) 0.25 MG tablet Take 0.25 mg by mouth daily.    Marland Kitchen torsemide (DEMADEX) 20 MG tablet Take 1 tablet (20 mg total) by mouth daily as needed (Swelling). 30 tablet 5  . warfarin (COUMADIN) 5 MG tablet Take 5 mg by mouth daily. Pt nor family does not know the dose of coumadin.    Marland Kitchen zolpidem (AMBIEN) 10 MG tablet Take 5 mg by mouth at bedtime as needed. For sleep    . meclizine (ANTIVERT) 25 MG tablet Take 12.5 mg by mouth daily as needed.       Home: Home Living Family/patient expects to be discharged to:: Unsure Living Arrangements: Alone Available Help at Discharge: Family, Available PRN/intermittently Type of Home: House Additional Comments: per chart, niece reports pt to have lived alone and was indep with ADLs.  Functional History: Prior Function Level of Independence: Independent with assistive device(s) Functional Status:  Mobility: Bed Mobility Overal bed mobility: Needs Assistance, +2 for physical assistance, + 2 for safety/equipment Bed Mobility: Rolling Rolling: Mod assist, Max assist Supine to sit: Total assist, +2 for physical assistance Sit to supine: Total assist, +2 for physical assistance General bed mobility comments: mod assist to roll to L, max to R, pt actively resisting attempt assistance to EOB Transfers General transfer comment: did not attempt due to safety concerns      ADL: ADL General ADL Comments: Pt requiring total assistance for all ADL.  Cognition: Cognition Overall Cognitive Status: Impaired/Different from baseline Arousal/Alertness: Awake/alert Orientation Level: Oriented to person, Disoriented to place, Disoriented to time, Disoriented to situation Attention: Focused (intermittent) Focused Attention: Impaired Focused Attention Impairment: Verbal basic, Functional basic, Verbal complex, Functional complex Memory: Impaired Memory Impairment: Other (comment) (unable to fully assess due to attention deficit) Awareness: Impaired Awareness  Impairment: Intellectual impairment, Emergent impairment, Anticipatory impairment Problem Solving: Impaired Problem Solving Impairment: Verbal basic, Verbal complex, Functional basic, Functional complex Executive Function: Reasoning, Organizing, Decision Making, Self Monitoring, Initiating, Self Correcting, Sequencing Reasoning: Impaired Reasoning Impairment: Verbal basic, Verbal complex, Functional basic, Functional complex Sequencing: Impaired Sequencing Impairment: Verbal basic, Verbal complex, Functional basic, Functional complex Organizing: Impaired Organizing Impairment: Verbal basic, Verbal complex, Functional basic, Functional complex Decision Making: Impaired Decision Making Impairment: Verbal basic, Verbal complex, Functional basic, Functional complex Initiating: Impaired Initiating Impairment: Verbal  basic, Verbal complex, Functional basic, Functional complex Self Monitoring: Impaired Self Monitoring Impairment: Verbal basic, Verbal complex, Functional basic, Functional complex Self Correcting: Impaired Self Correcting Impairment: Verbal basic, Verbal complex, Functional basic, Functional complex Behaviors: Restless Safety/Judgment: Impaired Cognition Arousal/Alertness: Lethargic Behavior During Therapy: Restless Overall Cognitive Status: Impaired/Different from baseline Area of Impairment: Attention, Following commands, Safety/judgement, Problem solving Orientation Level: Disoriented to, Place, Time, Situation Current Attention Level: Focused Memory: Decreased short-term memory Following Commands:  (does not follow commands) Safety/Judgement: Decreased awareness of safety, Decreased awareness of deficits Problem Solving: Slow processing, Requires verbal cues, Requires tactile cues, Difficulty sequencing, Decreased initiation General Comments: Pt attempting to vocalize only.  Blood pressure 162/68, pulse 70, temperature 98.3 F (36.8 C), temperature source Axillary, resp.  rate 18, height 5\' 7"  (1.702 m), weight 81.8 kg (180 lb 5.4 oz), SpO2 98 %. Physical Exam  HENT:  Head: Normocephalic.  Eyes:  Pupils reactive to light  Neck: Normal range of motion. Neck supple. No thyromegaly present.  Cardiovascular:  Cardiac rate controlled  Respiratory: Effort normal and breath sounds normal. No respiratory distress.  GI: Soft. Bowel sounds are normal. She exhibits no distension.  Neurological:  Patient globally aphasic with right side neglect. She was nonverbal during my exam. Did not follow commands   Patient withdrew to pinch on the left side and in the right lower extremity but not in the right upper extremity. Unable to do manual muscle testing although she does spontaneously move the left side and slightly in the right lower extremity, no active movement seen in the right upper extremity Results for orders placed or performed during the hospital encounter of 01/19/15 (from the past 24 hour(s))  Glucose, capillary     Status: Abnormal   Collection Time: 01/21/15  4:20 PM  Result Value Ref Range   Glucose-Capillary 262 (H) 65 - 99 mg/dL  Glucose, capillary     Status: Abnormal   Collection Time: 01/21/15  7:54 PM  Result Value Ref Range   Glucose-Capillary 206 (H) 65 - 99 mg/dL   Comment 1 Notify RN    Comment 2 Document in Chart   Glucose, capillary     Status: Abnormal   Collection Time: 01/22/15 12:09 AM  Result Value Ref Range   Glucose-Capillary 149 (H) 65 - 99 mg/dL   Comment 1 Notify RN    Comment 2 Document in Chart   Glucose, capillary     Status: Abnormal   Collection Time: 01/22/15  3:52 AM  Result Value Ref Range   Glucose-Capillary 176 (H) 65 - 99 mg/dL   Comment 1 Notify RN    Comment 2 Document in Chart   Glucose, capillary     Status: Abnormal   Collection Time: 01/22/15  7:40 AM  Result Value Ref Range   Glucose-Capillary 193 (H) 65 - 99 mg/dL  Basic metabolic panel     Status: Abnormal   Collection Time: 01/22/15 10:15 AM    Result Value Ref Range   Sodium 145 135 - 145 mmol/L   Potassium 3.5 3.5 - 5.1 mmol/L   Chloride 112 (H) 101 - 111 mmol/L   CO2 23 22 - 32 mmol/L   Glucose, Bld 215 (H) 65 - 99 mg/dL   BUN 35 (H) 6 - 20 mg/dL   Creatinine, Ser 1.70 (H) 0.44 - 1.00 mg/dL   Calcium 9.2 8.9 - 10.3 mg/dL   GFR calc non Af Amer 28 (L) >60 mL/min   GFR calc Af Amer 32 (  L) >60 mL/min   Anion gap 10 5 - 15  Glucose, capillary     Status: Abnormal   Collection Time: 01/22/15 11:44 AM  Result Value Ref Range   Glucose-Capillary 248 (H) 65 - 99 mg/dL   Dg Swallowing Func-speech Pathology  01/22/2015    Objective Swallowing Evaluation:   Patient Details Name: Joyce Terry MRN: [id number] Date of Birth: 11-20-1935 Today's Date: 01/22/2015 Time: SLP Start Time (ACUTE ONLY): 0938-SLP Stop Time (ACUTE ONLY): 0956 SLP Time Calculation (min) (ACUTE ONLY): 18 min Past Medical History: Past Medical History Diagnosis Date . Hypertension  . Atrial fibrillation ([place])  . Obesity  . Coronary artery disease    cardiac catheterization in 1997 -- Est. EF of 50% . Fatigue  . Stress  . Chest pain  . Peptic ulcer 1985 . GERD (gastroesophageal reflux disease)  . Depression  . Sinoatrial node dysfunction (HCC)  . Gastric polyps  . Colon polyps 05/19/2009   Tubular Adenomous polyps . Diverticulosis  . Endometrial cancer ([place]) 1995 . Hypercholesteremia  . DVT (deep venous thrombosis) ([place]) 1960's   ?RLE (03/16/2012) . Exertional dyspnea    "sometimes" (03/16/2012) . Type II diabetes mellitus ([place])  . Anemia    "iron transfusions ?twice" (03/16/2012) . Internal hemorrhoid, bleeding    "sometimes" (03/16/2012) . Migraines    "when I was going thru menopause; none lately" (03/16/2012) . Arthritis    "qwhere" (03/16/2012) . Chronic back pain  . Angina    occasional, none recent . Presence of permanent cardiac pacemaker  . CHF (congestive heart failure) (HCC)    chronic systolic CHF . Pneumonia 1991, 2012   x2 . 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  Hydronephrosis, right  . History of atrial fibrillation  . Hx of cardiac arrhythmia  . Hx of esophageal reflux  . DKA (diabetic ketoacidoses) ([place])  . CVA (cerebral infarction)  . CKD (chronic kidney disease), stage II  Past Surgical History: Past Surgical History Procedure Laterality Date . Vesicovaginal fistula closure w/ tah  1995 . Joint replacement     LEFT HIP   02/2008 . Cholecystectomy  fall 2001 . Cardiac catheterization  02/21/96; 03/16/2012   Est. EF of 50%; "just to look" (03/16/2012) . Coronary angioplasty with stent placement  02/15/11   "one" . Coronary angioplasty  03/16/2012   "couldn't put stent in cause of where it is" (03/16/2012) . Tonsillectomy  ~ 1962 . Total hip arthroplasty  02/2008   "left" (03/16/2012) . Insert / replace / remove pacemaker  07/23/99   atrial insertion of the AV node -- Medtronic single chamber pacemaker  . Insert / replace / remove pacemaker  2012   "replaced" (03/16/2012) . Ptca  02/2012   Balloon angioplast of jailed diagonal branch by prior LAD stent . Percutaneous coronary stent intervention (pci-s) N/A 02/15/2011   Procedure: PERCUTANEOUS CORONARY STENT INTERVENTION (PCI-S);  Surgeon: Burnell Blanks, MD;  Location: [facility] CATH LAB;  Service: Cardiovascular;  Laterality: N/A; . Pacemaker generator change N/A 03/02/2011   Procedure: PACEMAKER GENERATOR CHANGE;  Surgeon: Evans Lance, MD;  Location: [facility] CATH LAB;  Service: Cardiovascular;  Laterality: N/A; . Left heart catheterization with coronary angiogram N/A 03/16/2012   Procedure: LEFT HEART CATHETERIZATION WITH CORONARY ANGIOGRAM;  Surgeon: Thayer Headings, MD;  Location: [facility] CATH LAB;  Service: Cardiovascular;  Laterality: N/A; . Percutaneous coronary stent intervention (pci-s) N/A 03/16/2012   Procedure: PERCUTANEOUS CORONARY STENT INTERVENTION (PCI-S);  Surgeon: Burnell Blanks, MD;  Location: [facility] CATH LAB;  Service: Cardiovascular;  Laterality: N/A; . Abdominal hysterectomy  1995   "endometrial cancer"  (03/16/2012) total  . Dilation and curettage of uterus  late 1950's   "had probably 5; several miscarriages when I was younger" . Transurethral resection of bladder tumor with gyrus (turbt-gyrus) N/A 08/29/2014   Procedure: TRANSURETHRAL RESECTION OF BLADDER TUMOR WITH GYRUS (TURBT-GYRUS);  Surgeon: Ardis Hughs, MD;  Location: [facility] ORS;  Service: Urology;  Laterality: N/A; . Cystoscopy w/ ureteral stent placement Bilateral 08/29/2014   Procedure: BILATERAL RETROGRADE PYELOGRAM /RIGHT URETERAL STENT PLACEMENT;  Surgeon: Ardis Hughs, MD;  Location: [facility] ORS;  Service: Urology;  Laterality: Bilateral; HPI: Other Pertinent Information: ENYLA LISBON is a 79 y.o. female PMH, DM type 2 insulin-dependent, AFib on Coumadin, hyperlipidemia, HTN, chronic systolic heart failure, bladder cancer remotely was a recurrence this year, DVT. Daughter reports pt was previously independent with ADLs, pt found down at home on 01/19/15 presented to the ED with strokelike symptoms (expressive and receptive aphasia, right sided weakness). Initial evaluation reveals DKA within an ion gap of 20 and CT cerebral perfusion scan revealing ischemia throughout the posterior half of left MCA territory. This ischemic area appears roughly divided between infarct and penumbra. Thrombus noted in M2 branch.Per MD note, pt appears to have progressing dementia. Pt has language deficits and dysphagia as a result of her cva.  MBS indicated to determine readiness for po intake.   Assessment / Plan / Recommendation CHL IP CLINICAL IMPRESSIONS 01/22/2015 Therapy Diagnosis Mild oral phase dysphagia;Mild pharyngeal phase dysphagia Clinical Impression Testing was limited due to decreased participation from patient.  She did not want to lay on her back and when repositioned for testing, she flailed her arms, yelling out.  SLP was able to view pt swallow only a tsp of pudding and bolus of nectar.   Pt with delayed oral transiting and minimal oral  residuals.  Pharyngeal swallow mildly delayed but strong without residuals.  No aspiration present and pt's agitation negatively impacted swallowing test.  Coupling results of clinical testing at bedside and MBS, recommend conservative diet of dys1/honey via tsp and allow single ice chips with strict precautions.    CHL IP TREATMENT RECOMMENDATION 01/20/2015 Treatment Recommendations Therapy as outlined in treatment plan below   CHL IP DIET RECOMMENDATION 01/22/2015 SLP Diet Recommendations Ice chips PRN after oral care;Dysphagia 1 (Puree);Honey Liquid Administration via Tsp only initially  Medication Administration Crushed with puree Compensations Small sips/bites;Slow rate;Check for pocketing Postural Changes and/or Swallow Maneuvers Upright, 90*   CHL IP OTHER RECOMMENDATIONS 01/22/2015 Recommended Consults (None) Oral Care Recommendations Oral care before and after PO Other Recommendations Order thickener from pharmacy;Prohibited food (jello, ice cream, thin soups)   CHL IP FOLLOW UP RECOMMENDATIONS 01/21/2015 Follow up Recommendations Inpatient Rehab   CHL IP FREQUENCY AND DURATION 01/22/2015 Speech Therapy Frequency (ACUTE ONLY) min 2x/week Treatment Duration 2 weeks     CHL IP REASON FOR REFERRAL 01/22/2015 Reason for Referral Objectively evaluate swallowing function   CHL IP ORAL PHASE 01/22/2015 Oral Phase Impaired   CHL IP PHARYNGEAL PHASE 01/22/2015 Pharyngeal Phase Impaired   CHL IP CERVICAL ESOPHAGEAL PHASE 01/22/2015 Cervical Esophageal Phase [facility]   Joyce Salk, MS [facility] SLP [phone]   Dg Hips Bilat With Pelvis 2v  01/20/2015  CLINICAL DATA:  Pelvic pain following fall, initial encounter EXAM: DG HIP (WITH OR WITHOUT PELVIS) 2V BILAT COMPARISON:  None. FINDINGS: Left hip prosthesis is noted. No acute fracture or dislocation is seen. Significant degenerative changes of right  hip joint are noted. A right ureteral stent is seen. IMPRESSION: No acute abnormality noted. Electronically Signed   By: Inez Catalina M.D.   On: 01/20/2015 14:40    Assessment/Plan: Diagnosis: Right hemiparesis and aphasia secondary to left MCA embolic infarct 1. Does the need for close, 24 hr/day medical supervision in concert with the patient's rehab needs make it unreasonable for this patient to be served in a less intensive setting? Potentially 2. Co-Morbidities requiring supervision/potential complications: Dysphagia,Hypertension, diabetes with peripheral neuropathy, coronary artery disease, systolic CHF 3. Due to bladder management, bowel management, safety, skin/wound care, disease management, medication administration, pain management and patient education, does the patient require 24 hr/day rehab nursing? Yes 4. Does the patient require coordinated care of a physician, rehab nurse, PT (1-2 hrs/day, 5 days/week), OT (1-2 hrs/day, 5 days/week) and SLP (0.5-1 hrs/day, 5 days/week) to address physical and functional deficits in the context of the above medical diagnosis(es)? Yes Addressing deficits in the following areas: balance, endurance, locomotion, strength, transferring, bowel/bladder control, bathing, dressing, feeding, grooming, toileting, cognition, speech, language, swallowing and psychosocial support 5. Can the patient actively participate in an intensive therapy program of at least 3 hrs of therapy per day at least 5 days per week? No 6. The potential for patient to make measurable gains while on inpatient rehab is currently poor but should be good once level alertness increases 7. Anticipated functional outcomes upon discharge from inpatient rehab are min assist  with PT, min assist with OT, min assist with SLP. 8. Estimated rehab length of stay to reach the above functional goals is: 21-24 days 9. Does the patient have adequate social supports and living environment to accommodate these discharge functional goals? Potentially 10. Anticipated D/C setting: Home 11. Anticipated post D/C treatments: [place]  therapy 12. Overall Rehab/Functional Prognosis: fair  RECOMMENDATIONS: This patient's condition is appropriate for continued rehabilitative care in the following setting: CIR once alert and participating in physical therapy  and occupational therapy Patient has agreed to participate in recommended program. N/A Note that insurance prior authorization may be required for reimbursement for recommended care.  Comment:     01/22/2015

## 2015-01-22 NOTE — Progress Notes (Signed)
Rehab admissions - I am following this patient for potential inpatient rehab admission needs.  I will see patient in the am.  Call me for questions.  #[phone]

## 2015-01-22 NOTE — Progress Notes (Signed)
STROKE TEAM PROGRESS NOTE   SUBJECTIVE (INTERVAL HISTORY) No family at the bedside. Patient sleeping in bed. On wake up, she is able to say sentences "what do you want me to do" "I need water". She is able to move her RUE and RLE spontaneously and stronger. She is able to attend to her right side. She passed swallow today.    OBJECTIVE Temp:  [97.5 F (36.4 C)-98 F (36.7 C)] 98 F (36.7 C) (10/27 0515) Pulse Rate:  [70-88] 88 (10/27 0515) Cardiac Rhythm:  [-] Ventricular paced (10/27 0746) Resp:  [16] 16 (10/27 0515) BP: (165-179)/(58-73) 174/66 mmHg (10/27 0515) SpO2:  [93 %-99 %] 97 % (10/27 0515)  CBC:   Recent Labs Lab 01/19/15 1230 01/19/15 1815  WBC 24.5* 20.6*  NEUTROABS 22.8*  --   HGB 9.9* 9.8*  HCT 33.8* 32.5*  MCV 71.9* 69.0*  PLT 312 388    Basic Metabolic Panel:   Recent Labs Lab 01/20/15 0114 01/20/15 0648  NA 135 132*  K 4.7 4.2  CL 101 99*  CO2 21* 22  GLUCOSE 93 244*  BUN 46* 49*  CREATININE 1.83* 2.04*  CALCIUM 9.1 8.6*    Lipid Panel:     Component Value Date/Time   CHOL 172 01/19/2015 1815   TRIG 401* 01/19/2015 1815   HDL 19* 01/19/2015 1815   CHOLHDL 9.1 01/19/2015 1815   VLDL UNABLE TO CALCULATE IF TRIGLYCERIDE OVER 400 mg/dL 01/19/2015 1815   LDLCALC UNABLE TO CALCULATE IF TRIGLYCERIDE OVER 400 mg/dL 01/19/2015 1815   HgbA1c:  Lab Results  Component Value Date   HGBA1C 14.2* 01/19/2015   Urine Drug Screen:     Component Value Date/Time   LABOPIA NONE DETECTED 01/19/2015 1230   COCAINSCRNUR NONE DETECTED 01/19/2015 1230   LABBENZ NONE DETECTED 01/19/2015 1230   AMPHETMU NONE DETECTED 01/19/2015 1230   THCU NONE DETECTED 01/19/2015 1230   LABBARB NONE DETECTED 01/19/2015 1230      IMAGING I have personally reviewed the radiological images below and agree with the radiology interpretations.  Dg Chest 2 View 01/19/2015   Cardiomegaly.  No acute cardiopulmonary process.   Ct Head Wo Contrast 01/20/2015  IMPRESSION:  Evolving, moderate-sized left MCA infarct. No intracranial hemorrhage.   01/19/2015   No acute abnormality. Atrophy and chronic microvascular ischemic change.   Ct Cerebral Perfusion W/cm 01/19/2015   Ischemia throughout the posterior half left MCA territory. This ischemic area appears roughly divided between infarct and penumbra. No CTA but the left M1 is symmetrically patent.   CUS 1-39% right ICA plaquing. Vertebral artery flow is antegrade. Unable to scan left side secondary to patient confusion, movement, and uncooperativeness.  2D echo  - Left ventricle: The cavity size was normal. Systolic function wasmildly reduced. The estimated ejection fraction was in the rangeof 45% to 50%. Diffuse hypokinesis. There is hypokinesis of theinferoseptal myocardium. - Aortic valve: Trileaflet; mildly thickened, mildly calcifiedleaflets. - Mitral valve: Moderately calcified annulus. There was mildregurgitation. - Left atrium: The atrium was moderately dilated. - Right atrium: The atrium was moderately dilated. - Tricuspid valve: There was moderate regurgitation. - Pulmonic valve: There was moderate regurgitation. Impressions: When compared to prior, EF is reduced (inferior wall motionabnormality is now present) No cardiac source of emboli wasindentified.   PHYSICAL EXAM General - Well nourished, well developed, no acute distress  Ophthalmologic - Fundi not visualized due to noncooperation.  Cardiovascular - Regular rate and rhythm, paced rhythm.  Mental Status -  Awake, alert, but partial  global aphasia, but able to say some sentences purposefully, no significant neglect, not did not repeat or name as requested.  Cranial Nerves II - XII - II - blinking to visual threat bilaterally but inconsistent. III, IV, VI - attending to both sides V - not cooperative on exam. VII - right facial droop. VIII, X, XI - not cooperative on exam XII - Tongue in midline in mouth.  Motor Strength -  The patient's UEs and LEs move spontaneously, and RUE 4/5 and RLE 4/5 at least.  Bulk was normal and fasciculations were absent.   Motor Tone - Muscle tone was assessed at the neck and appendages and was normal.  Reflexes - The patient's reflexes were 1+ in all extremities and she had right babinski.  Sensory - Light touch, temperature/pinprick test not cooperative.    Coordination - not cooperative on exam.  Tremor was absent.  Gait and Station - not tested   ASSESSMENT/PLAN Joyce Terry is a 79 y.o. female with history of diabetes type 2 insulin-dependent, atrial fibrillation on Coumadin but INR 1.12 on arrival, hyperlipidemia, hypertension, chronic systolic heart failure, urethral cancer with recurrence this year presenting with aphasia and right hemiplegia. She did not receive IV t-PA due to late arrival.   Stroke:  Moderate size Left MCA infarct, embolic secondary to afib not complaint with medication  Resultant  Partial global aphaia, right hemiparesis with significant improvement today, attending to both side with no significant neglect  MRI  /  MRA  pacer  Carotid Doppler  R ok, L not scanned d/t pt uncooperation  2D Echo  No source of embolus, EF 45-50%, decreased from prior  LDL not able to calculate due to high TG  HgbA1c 14.2  subq heparin for VTE prophylaxis Diet NPO time specified. ST to reassess. Anticipate she may pass.  warfarin daily but noncompliance prior to admission, now aspirin 300 mg suppository daily. For stroke prevention, she needs to be on anticoagulation. Due to moderate sized MCA infarct, we recommend start anticoagulation in 5-7 days post stroke. If pt options NOAC, will recommend eliquis in 5 days post stroke. But if resume warfarin, will recommend start in day 3 post stroke with expectation of therapeutic INR at 5-7 days. Bridging with ASA at the meantime.   Patient counseled to be compliant with her antithrombotic medications  Ongoing  aggressive stroke risk factor management  Therapy recommendations:  pending  Disposition:  pending  Atrial Fibrillation  Home anticoagulation: discontinued in the hospital  INR 1.12 on admission, not complaint with medication  Due to moderate sized MCA infarct, we recommend start anticoagulation in 5-7 days post stroke. If pt option NOAC, will recommend eliquis in 5 days post stroke. But if resume warfarin, will recommend start in day 3 post stroke with expectation of therapeutic INR at 5-7 days. Bridging with ASA at hte meantime.     Hypertension  Stable Permissive hypertension (OK if < 180/105 due to end organ damage) but gradually normalize in 5-7 days.  Hyperlipidemia  Home meds:  Vytorin  LDL not able to calculate due to high TG, goal < 70  Resume home vytorin  Continue statin at discharge  Diabetes  HgbA1c 14.2, goal < 7.0  Uncontrolled  Need more aggressive glucose control  SSI   CBG monitoring  DM education  Other Stroke Risk Factors  Advanced age  Hx stroke/TIA  Coronary artery disease  Other Active Problems  CKD - Cre 2.04  Leukocytosis  Urethral cancer -  f/u with oncology  Hospital day # 3   Neurology will sign off. Please call with questions. Pt will follow up with Dr. Erlinda Hong at [facility] in about 2 months. Thanks for the consult.   Rosalin Hawking, MD PhD Stroke Neurology 01/22/2015 10:17 AM    To contact Stroke Continuity provider, please refer to [email or web address] After hours, contact General Neurology

## 2015-01-22 NOTE — Progress Notes (Signed)
MD at bedside, attempted to feed pt , she only took small sips. Pt remains confused and disoriented. She has been incontinent of urine several times and has been turned and changed at least hourly,.

## 2015-01-23 DIAGNOSIS — E785 Hyperlipidemia, unspecified: Secondary | ICD-10-CM

## 2015-01-23 LAB — GLUCOSE, CAPILLARY
GLUCOSE-CAPILLARY: 179 mg/dL — AB (ref 65–99)
GLUCOSE-CAPILLARY: 192 mg/dL — AB (ref 65–99)
GLUCOSE-CAPILLARY: 198 mg/dL — AB (ref 65–99)
GLUCOSE-CAPILLARY: 288 mg/dL — AB (ref 65–99)
Glucose-Capillary: 242 mg/dL — ABNORMAL HIGH (ref 65–99)
Glucose-Capillary: 259 mg/dL — ABNORMAL HIGH (ref 65–99)

## 2015-01-23 LAB — BASIC METABOLIC PANEL
ANION GAP: 11 (ref 5–15)
BUN: 41 mg/dL — ABNORMAL HIGH (ref 6–20)
CHLORIDE: 110 mmol/L (ref 101–111)
CO2: 24 mmol/L (ref 22–32)
Calcium: 9.1 mg/dL (ref 8.9–10.3)
Creatinine, Ser: 2.15 mg/dL — ABNORMAL HIGH (ref 0.44–1.00)
GFR calc non Af Amer: 21 mL/min — ABNORMAL LOW (ref >60)
GFR, EST AFRICAN AMERICAN: 24 mL/min — AB (ref >60)
Glucose, Bld: 195 mg/dL — ABNORMAL HIGH (ref 65–99)
POTASSIUM: 3.3 mmol/L — AB (ref 3.5–5.1)
SODIUM: 145 mmol/L (ref 135–145)

## 2015-01-23 MED ORDER — INSULIN ASPART 100 UNIT/ML ~~LOC~~ SOLN
3.0000 [IU] | Freq: Three times a day (TID) | SUBCUTANEOUS | Status: DC
  Administered 2015-01-23: 3 [IU] via SUBCUTANEOUS

## 2015-01-23 MED ORDER — CARVEDILOL 6.25 MG PO TABS
6.2500 mg | ORAL_TABLET | Freq: Two times a day (BID) | ORAL | Status: DC
Start: 2015-01-23 — End: 2015-01-25
  Administered 2015-01-23: 6.25 mg via ORAL
  Filled 2015-01-23 (×2): qty 1

## 2015-01-23 MED ORDER — INSULIN ASPART 100 UNIT/ML ~~LOC~~ SOLN
0.0000 [IU] | Freq: Three times a day (TID) | SUBCUTANEOUS | Status: DC
  Administered 2015-01-23 – 2015-01-24 (×2): 5 [IU] via SUBCUTANEOUS
  Administered 2015-01-24: 7 [IU] via SUBCUTANEOUS

## 2015-01-23 MED ORDER — INSULIN GLARGINE 100 UNIT/ML ~~LOC~~ SOLN
12.0000 [IU] | Freq: Every day | SUBCUTANEOUS | Status: DC
  Administered 2015-01-23 – 2015-01-24 (×2): 12 [IU] via SUBCUTANEOUS
  Filled 2015-01-23 (×4): qty 0.12

## 2015-01-23 NOTE — Progress Notes (Signed)
Thank you for consulting the Palliative Medicine Team at [facility] to meet your patient's and family's needs.   The reason that you asked [place] to see your patient is  For Establishing GOC  The Surrogate decision make is: Clarene Critchley Maynor/neice   # [phone]   I have left multiple messages regarding the need for [place] meeting and have not heard back at this time.  I did spoke with her friend Albertha Ghee who told me she would also relay the importence to patient's niece Candelaria Stagers for a return call and scheduling a meeting.  Your patient is able/unable to participate: unable  Wadie Lessen NP  Palliative Medicine Team Team Phone # [phone] Pager [phone]

## 2015-01-23 NOTE — Progress Notes (Signed)
Joyce Terry:[id number] DOB: [birth year] DOA: 01/19/2015 PCP: Geoffery Lyons, MD  Brief narrative: 79 y/o ? Atrial fibrillation Chad2vasc2 =4 on Coumadin Bradycardia + heart block status post PPM Prior GI bleed-history of internal hemorrhoids, small AVM cecum, left colonic diverticulosis HTN TY2 DM Known CAD status post stent mid LAD 01/2011. 02/2012 Transitional cell CA S/P TURP + stent placement 08/29/14-not candidate for cisplatin/cystectomy secondary to frailty and declined i overall condition as well as recent weight loss 60 pounds Lumbar degenerative disc disease  On admission found to have WBC 20, hemoglobin 9, platelets 323, BUN/creatinine 0.7/2.3-baseline creatinine around 2  Admitted from home with aphasia and marked facial droop with right-sided weakness initially npo as failed rpt swallow evals but cleared on 10/27 for Dys 1 diet   Past medical history-As per Problem list Chart reviewed as below-   Consultants:  Neuro  Procedures:  Ct head  Carotids  Echo p  Antibiotics:     Subjective   Sleepy today Hasnt been doing much per RN No family + ROS limited   Objective    Interim History:   Telemetry: nsr   Objective: Filed Vitals:   01/22/15 2111 01/23/15 0111 01/23/15 0500 01/23/15 0829  BP: 152/82 162/77 160/66 157/50  Pulse: 67 72 70 71  Temp: 98.1 F (36.7 C) 98.6 F (37 C) 98.3 F (36.8 C) 97.8 F (36.6 C)  TempSrc: Oral Axillary Axillary Axillary  Resp: 18 18 18 16   Height:      Weight:      SpO2: 98% 96% 98% 100%    Intake/Output Summary (Last 24 hours) at 01/23/15 0929 Last data filed at 01/22/15 1100  Gross per 24 hour  Intake      0 ml  Output      0 ml  Net      0 ml    Exam:  General: eomi, facial assym noted-non-coop c exam  Cardiovascular: s1 s 2no m/r/g Respiratory: clear no added sound Abdomen: non-tender LLQ Skin nad Neuro sleepy-cannot examine fully.  Moving 4 limbs more however  Data  Reviewed: Basic Metabolic Panel:  Recent Labs Lab 01/19/15 2124 01/20/15 0114 01/20/15 0648 01/22/15 1015 01/23/15 0219  NA 135 135 132* 145 145  K 4.5 4.7 4.2 3.5 3.3*  CL 101 101 99* 112* 110  CO2 22 21* 22 23 24   GLUCOSE 196* 93 244* 215* 195*  BUN 47* 46* 49* 35* 41*  CREATININE 2.05* 1.83* 2.04* 1.70* 2.15*  CALCIUM 9.0 9.1 8.6* 9.2 9.1   Liver Function Tests:  Recent Labs Lab 01/19/15 1230  AST 17  ALT 13*  ALKPHOS 125  BILITOT 1.7*  PROT 5.8*  ALBUMIN 2.4*   No results for input(s): LIPASE, AMYLASE in the last 168 hours. No results for input(s): AMMONIA in the last 168 hours. CBC:  Recent Labs Lab 01/19/15 1230 01/19/15 1815  WBC 24.5* 20.6*  NEUTROABS 22.8*  --   HGB 9.9* 9.8*  HCT 33.8* 32.5*  MCV 71.9* 69.0*  PLT 312 323   Cardiac Enzymes:  Recent Labs Lab 01/19/15 1230  TROPONINI 0.03   BNP: Invalid input(s): POCBNP CBG:  Recent Labs Lab 01/22/15 1631 01/22/15 1956 01/23/15 0003 01/23/15 0417 01/23/15 0806  GLUCAP 295* 253* 179* 198* 242*    Recent Results (from the past 240 hour(s))  MRSA PCR Screening     Status: None   Collection Time: 01/19/15  5:12 PM  Result Value Ref Range Status   MRSA by PCR  NEGATIVE NEGATIVE Final    Comment:        The GeneXpert MRSA Assay (FDA approved for NASAL specimens only), is one component of a comprehensive MRSA colonization surveillance program. It is not intended to diagnose MRSA infection nor to guide or monitor treatment for MRSA infections.      Studies:              All Imaging reviewed and is as per above notation   Scheduled Meds: .  stroke: mapping our early stages of recovery book   Does not apply Once  . antiseptic oral rinse  7 mL Mouth Rinse q12n4p  . aspirin  325 mg Oral Daily  . carvedilol  3.125 mg Oral BID WC  . chlorhexidine  15 mL Mouth Rinse BID  . escitalopram  10 mg Oral Daily  . ezetimibe-simvastatin  1 tablet Oral QHS  . heparin  5,000 Units  Subcutaneous 3 times per day  . insulin aspart  0-15 Units Subcutaneous Q4H  . insulin glargine  7 Units Subcutaneous QHS  . mometasone-formoterol  2 puff Inhalation BID  . rOPINIRole  0.25 mg Oral Daily   Continuous Infusions: . sodium chloride 50 mL/hr at 01/20/15 2140     Assessment/Plan:   1. Acute CVA, L MCA-Unable to give NOAC or coumadin.  Family state spatient would not have wanted aggressive medical care.  Passed MBS awaiting palliative input.--NO AC until 10/29 given size of infarct.  Will try to add back po meds and monitor for tolerance.  Would change to NOAC > coumadin given ease of administration.  Unclear if appropraite for CIR.  [place] get SNF as back-up plan.  No family around to d/c 2. Dysphagia-SLP graduated to Dys 1 diet.  Monitor for cough and signs aspiraiton 3. HTn Urgency  schedule hydralazine 5 mg iv q4 prn for Pressure above 150/90-added back Coreg 3.125 bid-->6.25 10.28.16 4. AKI/acute renal failure on admission-bun/creat 47/2.55-->35/1.77.  Keep on saline for now and monitor po intake.  Unclear how well hydrated she would stay without IVF.  May need this short term until taking PO more relaibly 5. Abd + L Hip pain-L hip xrays show no anomaly.   watch for dark/tarry stool. 6. Pyelonephritis-Ua small leuk, neg nitrites.  stopped Cipro after 3 days 10/27 7. DKA-found on admission-now resolved. 8. DM ty II-change to qid achs  Continue saline 50 cc/hr  Glucose is 198-242 range.  COnt Lantus 7 U qhs and SSI.  Home meds include glyburide 5 am, metformin 1000 bid Afib RVR Chad2vasc2=4-cannot keep on coumadin for now-->NOAC on 10/29.  Start ASA po 10/27 as a bridge for now.  ? Coreg dose to 6.25 bid 9. Transitional Ca bladder s/p TURB-non-op candidate 10. Lumbar DDD-hold pain meds.  Stable med condition.  Hold Fentanyl 50 mcg q 72 11. Prior GI bleed-stable 12. Cad with stents 2012/2013-consider Losartan25 daily/Coreg 3.125 bid when able tot take PO  Code Status:  DNR Family Communication: no family +.  pallaitive to discuss with famiyl today overall options still Disposition Plan: ? CIR vs SNF.  Social work has been consulted DVT prophylaxis: Lovenox Consultants: Neuro  Verneita Griffes, MD  [facility] Pager [phone] 01/23/2015, 9:29 AM    LOS: 4 days

## 2015-01-23 NOTE — Progress Notes (Signed)
Occupational Therapy Treatment Patient Details Name: JOYCELYNN FRITSCHE MRN: [id number] DOB: 1935-05-09 Today's Date: 01/23/2015    History of present illness MARDIE KELLEN is a 79 y.o. female past medical history that includes, diabetes type 2 insulin-dependent, atrial fibrillation on Coumadin, hyperlipidemia, hypertension, chronic systolic heart failure, bladder cancer remotely was a recurrence this year, DVT presents to the emergency department with strokelike symptoms. Initial evaluation reveals DKA within an ion gap of 20 and CT cerebral perfusion scan revealing ischemia throughout the posterior half left MCA territory.   OT comments  Pt continues to be lethargic with periods of restlessness.  Not following commands despite multimodal cues.  Max assist to reach sitting at EOB, poor sitting balance and tolerance of activity.    Follow Up Recommendations  SNF;Supervision/Assistance - 24 hour    Equipment Recommendations  3 in 1 bedside comode;Wheelchair (measurements OT);Wheelchair cushion (measurements OT);Hospital bed    Recommendations for Other Services      Precautions / Restrictions Precautions Precautions: Fall Precaution Comments: global aphasia Restrictions Weight Bearing Restrictions: No       Mobility Bed Mobility Overal bed mobility: Needs Assistance Bed Mobility: Rolling;Sidelying to Sit;Sit to Sidelying Rolling: Supervision Sidelying to sit: Max assist     Sit to sidelying: Min assist General bed mobility comments: Pt initially actively resisting attempts to assist her to sit EOB. Multimodal cues provided.  Pt sat EOB with mod assist for balance x 1 minute and then returned herself to sidelying with min assist for LEs.  Transfers                      Balance Overall balance assessment: Needs assistance Sitting-balance support: Feet supported Sitting balance-Leahy Scale: Poor                             ADL                                          General ADL Comments: hand over hand, total assist to wash face and hands      Vision                     Perception     Praxis      Cognition   Behavior During Therapy: Restless Overall Cognitive Status: No family/caregiver present to determine baseline cognitive functioning Area of Impairment: Following commands;Safety/judgement;Attention   Current Attention Level: Focused    Following Commands:  (not following commands) Safety/Judgement: Decreased awareness of safety;Decreased awareness of deficits          Extremity/Trunk Assessment               Exercises     Shoulder Instructions       General Comments      Pertinent Vitals/ Pain       Pain Assessment: Faces Faces Pain Scale: No hurt Pain Intervention(s): Limited activity within patient's tolerance;Repositioned  Home Living                                          Prior Functioning/Environment              Frequency Min 2X/week     Progress Toward  Goals  OT Goals(current goals can now be found in the care plan section)  Progress towards OT goals: Not progressing toward goals - comment (pt remains restless and lethargic)     Plan Discharge plan needs to be updated    Co-evaluation                 End of Session     Activity Tolerance Patient limited by lethargy   Patient Left in bed;with call bell/phone within reach;with bed alarm set   Nurse Communication          Time: 2585-2778 OT Time Calculation (min): 14 min  Charges: OT General Charges $OT Visit: 1 Procedure OT Treatments $Therapeutic Activity: 8-22 mins  Malka So 01/23/2015, 11:29 AM  [phone]

## 2015-01-23 NOTE — Progress Notes (Signed)
Speech Language Pathology Treatment: Dysphagia;Cognitive-Linquistic  Patient Details Name: Joyce Terry MRN: [id number] DOB: Nov 15, 1935 Today's Date: 01/23/2015 Time: 3403-7096 SLP Time Calculation (min) (ACUTE ONLY): 20 min  Assessment / Plan / Recommendation Clinical Impression  Pt seen in am to facilitate consumption of meds with RN student and of breakfast. Pt easily awoke but max cues needed for focused attention to PO intake. Hand over hand assist with cup most effective with pt looking and grasping cup, initiating oral transit and swallow though delayed. Pt was able to focus attention briefly to therapist with simple verbal commands and questions with exaggerated intonation such as "Drink!" and "Sleep?"  Pt nodded yes and repeated "Sleep" with some phonemic paraphasia noted. Recommend pt continue current diet and strategies with staff spoon feeding and providing max assist for oral intake with suction after to ensure transit. Pt making progress, strongly recommend CIR.    HPI Other Pertinent Information: Joyce Terry is a 79 y.o. female PMH, DM type 2 insulin-dependent, AFib on Coumadin, hyperlipidemia, HTN, chronic systolic heart failure, bladder cancer remotely was a recurrence this year, DVT. Daughter reports pt was previously independent with ADLs, pt found down at home on 01/19/15 presented to the ED with strokelike symptoms (expressive and receptive aphasia, right sided weakness). Initial evaluation reveals DKA within an ion gap of 20 and CT cerebral perfusion scan revealing ischemia throughout the posterior half of left MCA territory. This ischemic area appears roughly divided between infarct and penumbra. Thrombus noted in M2 branch.Per MD note, pt appears to have progressing dementia. Pt has language deficits and dysphagia as a result of her cva.  MBS indicated to determine readiness for po intake.     Pertinent Vitals Pain Assessment: Faces Faces Pain Scale: Hurts a little  bit Pain Intervention(s): Limited activity within patient's tolerance;Repositioned  SLP Plan  Continue with current plan of care    Recommendations Diet recommendations: Dysphagia 1 (puree);Honey-thick liquid Liquids provided via: Teaspoon;Cup Medication Administration: Crushed with puree Supervision: Full supervision/cueing for compensatory strategies Compensations: Small sips/bites;Slow rate;Check for pocketing Postural Changes and/or Swallow Maneuvers: Seated upright 90 degrees              Plan: Continue with current plan of care    GO     Joyce Terry, Joyce Terry 01/23/2015, 8:55 AM

## 2015-01-23 NOTE — Progress Notes (Signed)
Inpatient Diabetes Program Recommendations  AACE/ADA: New Consensus Statement on Inpatient Glycemic Control (2015)  Target Ranges:  Prepandial:   less than 140 mg/dL      Peak postprandial:   less than 180 mg/dL (1-2 hours)      Critically ill patients:  140 - 180 mg/dL   Review of Glycemic Control  Diabetes history: Diabetes Mellitus (see's Dr. Reynaldo Minium) Outpatient Diabetes medications: Lantus 15 units daily, Novolog 4-8 units tid with meals, Metformin, Diabeta Current orders for Inpatient glycemic control: Lantus 7 units QHS, Novolog Moderate Q4hrs  A1c 14.2% on 01/19/2015   Inpatient Diabetes Program Recommendations:  Blood sugars above inpatient goal, Fasting glucose 242 mg/dl. May consider increasing Lantus to home dose of 15 units daily.   Thanks,  Tama Headings RN, MSN, [facility] Inpatient Diabetes Coordinator Team Pager [phone] (8a-5p)

## 2015-01-23 NOTE — Clinical Social Work Note (Signed)
CSW received consult for patient needing SNF for short term rehab.  CSW to complete assessment and continue to follow patient's progress.  Jones Broom. [place], MSW, [place] 01/23/2015 5:36 PM

## 2015-01-23 NOTE — Progress Notes (Signed)
Rehab admissions - I spoke with patient's niece today.  The niece and all family work and cannot provide care after a potential inpatient rehab stay.  Patient will need someone to stay with her and provide care after a rehab stay.  Since no family can assist, recommend pursuit of SNF placement.  I will let case manager and social worker know of need for SNF placement.  Call me for questions.  #[phone]

## 2015-01-24 DIAGNOSIS — I63013 Cerebral infarction due to thrombosis of bilateral vertebral arteries: Secondary | ICD-10-CM

## 2015-01-24 DIAGNOSIS — Z515 Encounter for palliative care: Secondary | ICD-10-CM

## 2015-01-24 DIAGNOSIS — Z7189 Other specified counseling: Secondary | ICD-10-CM

## 2015-01-24 LAB — GLUCOSE, CAPILLARY
GLUCOSE-CAPILLARY: 271 mg/dL — AB (ref 65–99)
GLUCOSE-CAPILLARY: 319 mg/dL — AB (ref 65–99)
GLUCOSE-CAPILLARY: 306 mg/dL — AB (ref 65–99)
Glucose-Capillary: 227 mg/dL — ABNORMAL HIGH (ref 65–99)
Glucose-Capillary: 251 mg/dL — ABNORMAL HIGH (ref 65–99)
Glucose-Capillary: 260 mg/dL — ABNORMAL HIGH (ref 65–99)

## 2015-01-24 MED ORDER — INSULIN ASPART 100 UNIT/ML ~~LOC~~ SOLN
0.0000 [IU] | SUBCUTANEOUS | Status: DC
  Administered 2015-01-24 (×2): 8 [IU] via SUBCUTANEOUS
  Administered 2015-01-25: 3 [IU] via SUBCUTANEOUS
  Administered 2015-01-25 (×2): 5 [IU] via SUBCUTANEOUS

## 2015-01-24 MED ORDER — INSULIN ASPART 100 UNIT/ML ~~LOC~~ SOLN
3.0000 [IU] | SUBCUTANEOUS | Status: DC
  Administered 2015-01-24: 3 [IU] via SUBCUTANEOUS

## 2015-01-24 MED ORDER — INFLUENZA VAC SPLIT QUAD 0.5 ML IM SUSY
0.5000 mL | PREFILLED_SYRINGE | INTRAMUSCULAR | Status: DC
  Filled 2015-01-24: qty 0.5

## 2015-01-24 MED ORDER — PNEUMOCOCCAL VAC POLYVALENT 25 MCG/0.5ML IJ INJ: 0.5000 mL | INJECTION | INTRAMUSCULAR | Status: DC

## 2015-01-24 NOTE — Progress Notes (Signed)
Patient not alert enough to eat or take medications. MD notified. Will attempt to give medications when patient more alert.

## 2015-01-24 NOTE — Consult Note (Signed)
Consultation Note Date: 01/24/2015   Patient Name: Joyce Terry  DOB: [birth year]  MRN: [id number]  Age / Sex: 79 y.o., female  PCP: Burnard Bunting, MD Referring Physician: Nita Sells, MD  Reason for Consultation: Establishing goals of care  Clinical Assessment/Narrative: 78 y/o female with PMHx of Atrial fibrillation (on Coumadin), Bradycardia secondary to heart block status post PPM, prior GI bleed-history, HTN, T2 DM, CAD status post stent mid LAD , transitional cell CA S/P TURP, Lumbar degenerative disc disease who was admitted following development of aphasia and marked facial droop with right-sided weakness and found to have L MCA CVA.  She initially failed rpt swallow evals but cleared on 10/27 for Dys 1 diet.  Over the past couple of days she has become more somnolent with no significant PO intake.  Palliative consulted for goals of care.  Contacts/Participants in Discussion: Candelaria Stagers Primary Decision Maker: Clarene Critchley Maynor Relationship to Patient Niece HCPOA:None   SUMMARY OF RECOMMENDATIONS - Spoke with patient's niece, Candelaria Stagers.  She reports that her aunt's independence is what is most important to her.  She would not want to have aggressive measures if she does not have good chance of returning to a functional status where she would be able to live independently. - She does not think that her aunt would want to have artificial nutrition/PEG tube under any circumstances. - We discussed that there is a real possibility that her aunt will not recover enough to maintain her own nutrition and hydration.  Her niece is leaning toward comfort care if this is the case. - She would like to see how she does over the next 24 hours.  She will call when she is visiting tomorrow and we are planning to continue conversation at that time.   Code Status/Advance Care Planning: DNR    Code Status Orders         Start     Ordered   01/19/15 1712  Do not attempt resuscitation (DNR)   Continuous    Question Answer Comment  In the event of cardiac or respiratory ARREST Do not call a "code blue"   In the event of cardiac or respiratory ARREST Do not perform Intubation, CPR, defibrillation or ACLS   In the event of cardiac or respiratory ARREST Use medication by any route, position, wound care, and other measures to relive pain and suffering. May use oxygen, suction and manual treatment of airway obstruction as needed for comfort.      01/19/15 1711      Symptom Management:   Patient unresponsive.  Appears comfortable.  Additional Recommendations (Limitations, Scope, Preferences):  No Artificial Feeding  Psycho-social/Spiritual:  Support System: [place] Desire for further Chaplaincy support no  Prognosis: Unable to determine.  If she remains unable to maintain her nutrition and hydration, her expected time course would be in the matter of less than 2 weeks.  Discharge Planning: To be determined   Chief Complaint/ Primary Diagnoses: Present on Admission:  . CVA (cerebrovascular accident) ([place]) . DKA (diabetic ketoacidoses) ([place]) . Acidosis . Acute renal failure superimposed on stage 3 chronic kidney disease ([place]) . OBESITY, MORBID . Atrial fibrillation ([place]) . CAD (coronary artery disease) . Chronic diastolic CHF (congestive heart failure) ([place]) . Bladder cancer ([place]) . UTI (lower urinary tract infection)  I have reviewed the medical record, interviewed the patient and family, and examined the patient. The following aspects are pertinent.  Past Medical History  Diagnosis Date  . Hypertension   .  Atrial fibrillation ([place])   . Obesity   . Coronary artery disease     cardiac catheterization in 1997 -- Est. EF of 50%  . Fatigue   . Stress   . Chest pain   . Peptic ulcer 1985  . GERD (gastroesophageal reflux disease)   . Depression   . Sinoatrial node dysfunction (H[facility])    . Gastric polyps   . Colon polyps 05/19/2009    Tubular Adenomous polyps  . Diverticulosis   . Endometrial cancer ([place]) 1995  . Hypercholesteremia   . DVT (deep venous thrombosis) ([place]) 1960's    ?RLE (03/16/2012)  . Exertional dyspnea     "sometimes" (03/16/2012)  . Type II diabetes mellitus ([place])   . Anemia     "iron transfusions ?twice" (03/16/2012)  . Internal hemorrhoid, bleeding     "sometimes" (03/16/2012)  . Migraines     "when I was going thru menopause; none lately" (03/16/2012)  . Arthritis     "qwhere" (03/16/2012)  . Chronic back pain   . Angina     occasional, none recent  . Presence of permanent cardiac pacemaker   . CHF (congestive heart failure) (H[facility])     chronic systolic CHF  . Pneumonia 1991, 2012    x2  . 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 Hydronephrosis, right   . History of atrial fibrillation   . Hx of cardiac arrhythmia   . Hx of esophageal reflux   . DKA (diabetic ketoacidoses) ([place])   . CVA (cerebral infarction)   . CKD (chronic kidney disease), stage II    Social History   Social History  . Marital Status: Divorced    Spouse Name: N/A  . Number of Children: N/A  . Years of Education: N/A   Occupational History  . Retired    Social History Main Topics  . Smoking status: Former Smoker -- 1.00 packs/day for 35 years    Types: Cigarettes    Start date: 08/26/1989  . Smokeless tobacco: Never Used     Comment: quit smoking "11/1989"  . Alcohol Use: 0.0 oz/week    0 Standard drinks or equivalent per week     Comment: "glass of wine q once in awhile"  . Drug Use: No  . Sexual Activity: No   Other Topics Concern  . None   Social History Narrative   Family History  Problem Relation Age of Onset  . Heart disease Mother   . Hypertension Mother    Scheduled Meds: .  stroke: mapping our early stages of recovery book   Does not apply Once  . antiseptic oral rinse  7 mL Mouth Rinse q12n4p  . aspirin  325 mg Oral Daily  .  carvedilol  6.25 mg Oral BID WC  . chlorhexidine  15 mL Mouth Rinse BID  . ezetimibe-simvastatin  1 tablet Oral QHS  . heparin  5,000 Units Subcutaneous 3 times per day  . [START ON 01/25/2015] Influenza vac split quadrivalent PF  0.5 mL Intramuscular Tomorrow-1000  . insulin aspart  0-15 Units Subcutaneous 6 times per day  . insulin aspart  3 Units Subcutaneous Q4H  . insulin glargine  12 Units Subcutaneous QHS  . mometasone-formoterol  2 puff Inhalation BID  . [START ON 01/25/2015] pneumococcal 23 valent vaccine  0.5 mL Intramuscular Tomorrow-1000   Continuous Infusions:  PRN Meds:.hydrALAZINE, RESOURCE THICKENUP CLEAR, senna-docusate Medications Prior to Admission:  Prior to Admission medications   Medication Sig Start Date End Date Taking? Authorizing  Provider  B-D UF III MINI PEN NEEDLES 31G X 5 MM MISC  01/30/14  Yes Historical Provider, MD  calcium-vitamin D (OSCAL WITH D) 500-200 MG-UNIT per tablet Take 1 tablet by mouth 2 (two) times daily. Patient taking differently: Take 1 tablet by mouth daily with breakfast.  06/26/13  Yes Velna Hatchet, MD  carvedilol (COREG) 3.125 MG tablet Take 1 tablet (3.125 mg total) by mouth 2 (two) times daily with a meal. 09/15/14  Yes Thayer Headings, MD  docusate sodium (COLACE) 100 MG capsule Take 1 capsule (100 mg total) by mouth 2 (two) times daily as needed (take to keep stool soft.). 08/29/14  Yes Ardis Hughs, MD  escitalopram (LEXAPRO) 10 MG tablet Take 10 mg by mouth daily.     Yes Historical Provider, MD  ezetimibe-simvastatin (VYTORIN) 10-20 MG per tablet Take 1 tablet by mouth at bedtime.    Yes Historical Provider, MD  fentaNYL (DURAGESIC - DOSED MCG/HR) 50 MCG/HR Place 1 patch onto the skin every 3 (three) days.   Yes Historical Provider, MD  Fluticasone-Salmeterol (ADVAIR) 250-50 MCG/DOSE AEPB Inhale 1 puff into the lungs every 12 (twelve) hours as needed. For shortness of breath    Yes Historical Provider, MD  glyBURIDE (DIABETA) 5 MG  tablet Take 5 mg by mouth daily with breakfast.   Yes Historical Provider, MD  insulin aspart (NOVOLOG) 100 UNIT/ML injection Inject 4-8 Units into the skin 2 (two) times daily. If blood sugar>250, use 7-8 units. If if <200, use 4-5 units    Yes Historical Provider, MD  insulin glargine (LANTUS) 100 UNIT/ML injection Inject 0.15 mLs (15 Units total) into the skin at bedtime. Patient taking differently: Inject 10-20 Units into the skin at bedtime. Sliding scale 06/26/13  Yes Velna Hatchet, MD  losartan (COZAAR) 25 MG tablet take 1 tablet by mouth once daily 05/20/14  Yes Evans Lance, MD  metFORMIN (GLUCOPHAGE) 500 MG tablet Take 1,000 mg by mouth 2 (two) times daily with a meal.   Yes Historical Provider, MD  OVER THE COUNTER MEDICATION Place 1-2 drops into both eyes daily as needed. For dry/itchy eyes --saline drops    Yes Historical Provider, MD  oxyCODONE-acetaminophen (PERCOCET) 10-325 MG per tablet Take 1 tablet by mouth every 4 (four) hours as needed. For pain 08/29/14  Yes Ardis Hughs, MD  pantoprazole (PROTONIX) 40 MG tablet Take 40 mg by mouth 2 (two) times daily.  03/25/13  Yes Historical Provider, MD  phenazopyridine (PYRIDIUM) 200 MG tablet Take 1 tablet (200 mg total) by mouth 3 (three) times daily as needed for pain. For urinary burning Note: will stain clothing 08/31/14  Yes Carolan Clines, MD  rOPINIRole (REQUIP) 0.25 MG tablet Take 0.25 mg by mouth daily.   Yes Historical Provider, MD  torsemide (DEMADEX) 20 MG tablet Take 1 tablet (20 mg total) by mouth daily as needed (Swelling). 10/01/14  Yes Thayer Headings, MD  warfarin (COUMADIN) 5 MG tablet Take 5 mg by mouth daily. Pt nor family does not know the dose of coumadin.   Yes Historical Provider, MD  zolpidem (AMBIEN) 10 MG tablet Take 5 mg by mouth at bedtime as needed. For sleep   Yes Historical Provider, MD  meclizine (ANTIVERT) 25 MG tablet Take 12.5 mg by mouth daily as needed.  04/08/14   Historical Provider, MD    Allergies  Allergen Reactions  . Demerol Nausea And Vomiting  . Penicillins Hives  . Tape Other (See Comments)    "  surgical tape tears skin up; paper tape is ok"  . Morphine And Related Itching  . Codeine Nausea And Vomiting   CBC:    Component Value Date/Time   WBC 20.6* 01/19/2015 1815   HGB 9.8* 01/19/2015 1815   HCT 32.5* 01/19/2015 1815   PLT 323 01/19/2015 1815   MCV 69.0* 01/19/2015 1815   NEUTROABS 22.8* 01/19/2015 1230   LYMPHSABS 0.5* 01/19/2015 1230   MONOABS 1.2* 01/19/2015 1230   EOSABS 0.0 01/19/2015 1230   BASOSABS 0.0 01/19/2015 1230   Comprehensive Metabolic Panel:    Component Value Date/Time   NA 145 01/23/2015 0219   K 3.3* 01/23/2015 0219   CL 110 01/23/2015 0219   CO2 24 01/23/2015 0219   BUN 41* 01/23/2015 0219   CREATININE 2.15* 01/23/2015 0219   GLUCOSE 195* 01/23/2015 0219   CALCIUM 9.1 01/23/2015 0219   AST 17 01/19/2015 1230   ALT 13* 01/19/2015 1230   ALKPHOS 125 01/19/2015 1230   BILITOT 1.7* 01/19/2015 1230   PROT 5.8* 01/19/2015 1230   ALBUMIN 2.4* 01/19/2015 1230    Review of Systems  Unable to perform ROS: Patient unresponsive    Physical Exam  Constitutional: No distress.  Does not arouse to verbal or tactile stimulation  HENT:  Head: Normocephalic and atraumatic.  Eyes: Right eye exhibits no discharge. Left eye exhibits no discharge. No scleral icterus.  Neck: No JVD present. No tracheal deviation present.  Cardiovascular: Normal rate.   Murmur heard. Respiratory: No respiratory distress. She has no wheezes.  GI: She exhibits no distension. There is no tenderness.  Musculoskeletal: She exhibits no edema.  Neurological:  Unable to assess  Skin: Skin is warm and dry. She is not diaphoretic.    Vital Signs: BP 142/84 mmHg  Pulse 70  Temp(Src) 98.4 F (36.9 C) (Oral)  Resp 20  Ht 5\' 7"  (1.702 m)  Wt 81.8 kg (180 lb 5.4 oz)  BMI 28.24 kg/m2  SpO2 99% SpO2: Last BM Date: 01/22/15  O2 Device:SpO2: 99 % O2 Flow  Rate: .  Intake/output summary:  Intake/Output Summary (Last 24 hours) at 01/24/15 2310 Last data filed at 01/24/15 1259  Gross per 24 hour  Intake      0 ml  Output      0 ml  Net      0 ml   LBM:  BMP Latest Ref Rng 01/23/2015 01/22/2015 01/20/2015  Glucose 65 - 99 mg/dL 195(H) 215(H) 244(H)  BUN 6 - 20 mg/dL 41(H) 35(H) 49(H)  Creatinine 0.44 - 1.00 mg/dL 2.15(H) 1.70(H) 2.04(H)  Sodium 135 - 145 mmol/L 145 145 132(L)  Potassium 3.5 - 5.1 mmol/L 3.3(L) 3.5 4.2  Chloride 101 - 111 mmol/L 110 112(H) 99(L)  CO2 22 - 32 mmol/L 24 23 22   Calcium 8.9 - 10.3 mg/dL 9.1 9.2 8.6(L)    Baseline Weight: Weight: 81.8 kg (180 lb 5.4 oz) Most recent weight: Weight: 81.8 kg (180 lb 5.4 oz)      Palliative Assessment/Data:    Additional Data Reviewed: Recent Labs     01/22/15  1015  01/23/15  0219  NA  145  145  BUN  35*  41*  CREATININE  1.70*  2.15*    Time In: 1630 Time Out: 1730 Time Total: 60 Greater than 50%  of this time was spent counseling and coordinating care related to the above assessment and plan.  Signed by: Micheline Rough, MD  Micheline Rough, MD  01/24/2015, 11:10 PM  Please  contact Palliative Medicine Team phone at [phone] for questions and concerns.

## 2015-01-24 NOTE — Progress Notes (Signed)
SLP Cancellation Note  Patient Details Name: ELIZET KAPLAN MRN: [id number] DOB: [birth year]   Cancelled treatment:       Reason treat Not Completed: Fatigue/lethargy limiting ability to participate; pt not sufficiently alert for any POs; not arousable to voice, wet washcloth or sternal rub.     Juan Quam Laurice 01/24/2015, 2:34 PM

## 2015-01-24 NOTE — Progress Notes (Signed)
Joyce Terry YQI:[id number] DOB: 16-Jan-1936 DOA: 01/19/2015 PCP: Geoffery Lyons, MD  Brief narrative: 79 y/o ? Atrial fibrillation Chad2vasc2 =4 on Coumadin Bradycardia + heart block status post PPM Prior GI bleed-history of internal hemorrhoids, small AVM cecum, left colonic diverticulosis HTN TY2 DM Known CAD status post stent mid LAD 01/2011. 02/2012 Transitional cell CA S/P TURP + stent placement 08/29/14-not candidate for cisplatin/cystectomy secondary to frailty and declined i overall condition as well as recent weight loss 60 pounds Lumbar degenerative disc disease  On admission found to have WBC 20, hemoglobin 9, platelets 323, BUN/creatinine 0.7/2.3-baseline creatinine around 2  Admitted from home with aphasia and marked facial droop with right-sided weakness initially npo as failed rpt swallow evals but cleared on 10/27 for Dys 1 diet   Past medical history-As per Problem list Chart reviewed as below-   Consultants:  Neuro  Procedures:  Ct head  Carotids  Echo p  Antibiotics:    Subjective   Not awake or oriented Hasn't taken po meds or food recently   Objective    Interim History:   Telemetry: nsr   Objective: Filed Vitals:   01/24/15 0105 01/24/15 0506 01/24/15 0926 01/24/15 1340  BP: 143/75 139/57 173/61 172/66  Pulse: 79 74 72 74  Temp: 98.1 F (36.7 C) 98 F (36.7 C) 98 F (36.7 C) 98.9 F (37.2 C)  TempSrc: Oral Oral Oral Oral  Resp: 18 18 18 18   Height:      Weight:      SpO2: 100% 100% 99% 99%    Intake/Output Summary (Last 24 hours) at 01/24/15 1604 Last data filed at 01/24/15 1259  Gross per 24 hour  Intake      0 ml  Output      0 ml  Net      0 ml    Exam:  General: eomi, noted-non-coop c exam  Cardiovascular: s1 s2 no m/r/g Respiratory: clear no added sound Neuro sleepy-cannot examine fully.  Moving 4 limbs more however  Data Reviewed: Basic Metabolic Panel:  Recent Labs Lab 01/19/15 2124  01/20/15 0114 01/20/15 0648 01/22/15 1015 01/23/15 0219  NA 135 135 132* 145 145  K 4.5 4.7 4.2 3.5 3.3*  CL 101 101 99* 112* 110  CO2 22 21* 22 23 24   GLUCOSE 196* 93 244* 215* 195*  BUN 47* 46* 49* 35* 41*  CREATININE 2.05* 1.83* 2.04* 1.70* 2.15*  CALCIUM 9.0 9.1 8.6* 9.2 9.1   Liver Function Tests:  Recent Labs Lab 01/19/15 1230  AST 17  ALT 13*  ALKPHOS 125  BILITOT 1.7*  PROT 5.8*  ALBUMIN 2.4*   No results for input(s): LIPASE, AMYLASE in the last 168 hours. No results for input(s): AMMONIA in the last 168 hours. CBC:  Recent Labs Lab 01/19/15 1230 01/19/15 1815  WBC 24.5* 20.6*  NEUTROABS 22.8*  --   HGB 9.9* 9.8*  HCT 33.8* 32.5*  MCV 71.9* 69.0*  PLT 312 323   Cardiac Enzymes:  Recent Labs Lab 01/19/15 1230  TROPONINI 0.03   BNP: Invalid input(s): POCBNP CBG:  Recent Labs Lab 01/23/15 2002 01/24/15 0020 01/24/15 0408 01/24/15 0854 01/24/15 1118  GLUCAP 259* 227* 271* 319* 306*    Recent Results (from the past 240 hour(s))  MRSA PCR Screening     Status: None   Collection Time: 01/19/15  5:12 PM  Result Value Ref Range Status   MRSA by PCR NEGATIVE NEGATIVE Final    Comment:  The GeneXpert MRSA Assay (FDA approved for NASAL specimens only), is one component of a comprehensive MRSA colonization surveillance program. It is not intended to diagnose MRSA infection nor to guide or monitor treatment for MRSA infections.      Studies:              All Imaging reviewed and is as per above notation   Scheduled Meds: .  stroke: mapping our early stages of recovery book   Does not apply Once  . antiseptic oral rinse  7 mL Mouth Rinse q12n4p  . aspirin  325 mg Oral Daily  . carvedilol  6.25 mg Oral BID WC  . chlorhexidine  15 mL Mouth Rinse BID  . ezetimibe-simvastatin  1 tablet Oral QHS  . heparin  5,000 Units Subcutaneous 3 times per day  . [START ON 01/25/2015] Influenza vac split quadrivalent PF  0.5 mL Intramuscular  Tomorrow-1000  . insulin aspart  0-15 Units Subcutaneous 6 times per day  . insulin aspart  3 Units Subcutaneous TID WC  . insulin glargine  12 Units Subcutaneous QHS  . mometasone-formoterol  2 puff Inhalation BID  . [START ON 01/25/2015] pneumococcal 23 valent vaccine  0.5 mL Intramuscular Tomorrow-1000   Continuous Infusions:     Assessment/Plan:   1. Acute CVA, L MCA-Unable to give NOAC or coumadin.  Family state spatient would not have wanted aggressive medical care.  Passed MBS awaiting palliative input.--NO AC until 10/29 given size of infarct.  Will try to add back po meds and monitor for tolerance.  Would change to NOAC over coumadin given ease of administration. 2. Dysphagia-SLP graduated to Dys 1 diet.  Monitor for cough and signs aspiration.  Portable cxr 3. HTn Urgency  schedule hydralazine 5 mg iv q4 prn for Pressure above 150/90-added back Coreg 3.125 bid-->6.25 10.28.16 4. AKI/acute renal failure on admission-bun/creat 47/2.55-->35/1.77.  Saline lock  Hydration wouldn't change prognosis 5. Abd + L Hip pain-L hip xrays show no anomaly.   watch for dark/tarry stool. 6. Pyelonephritis-Ua small leuk, neg nitrites.  stopped Cipro after 3 days 10/27 7. DKA-found on admission-now resolved. 8. DM ty II-change to qid achs  Glucose is 300 range.  COnt Lantus 7 U qhs and changed to modertae SSI with q4 insuin 3 u Home meds include glyburide 5 am, metformin 1000 bid Afib RVR Chad2vasc2=4-cannot keep on coumadin for now-->NOAC on 10/29.  Start ASA po 10/27 as a bridge for now.  ? Coreg dose to 6.25 bid 9. Transitional Ca bladder s/p TURB-non-op candidate 10. Lumbar DDD-hold pain meds.  Stable med condition.  Hold Fentanyl 50 mcg q 72 11. Prior GI bleed-stable 12. Cad with stents 2012/2013-consider Losartan25 daily/Coreg 3.125 bid when able tot take PO  Code Status: DNR Family Communication: no family +.  pallaitive to discuss with famiyl today overall options still Disposition Plan:  likely full comfort or snf with palliative to follow DVT prophylaxis: Lovenox Consultants: Neuro  Verneita Griffes, MD  [facility] Pager [phone] 01/24/2015, 4:04 PM    LOS: 5 days

## 2015-01-25 ENCOUNTER — Inpatient Hospital Stay (HOSPITAL_COMMUNITY): Payer: Medicare Other

## 2015-01-25 DIAGNOSIS — I6302 Cerebral infarction due to thrombosis of basilar artery: Secondary | ICD-10-CM

## 2015-01-25 DIAGNOSIS — E1310 Other specified diabetes mellitus with ketoacidosis without coma: Secondary | ICD-10-CM

## 2015-01-25 DIAGNOSIS — I25118 Atherosclerotic heart disease of native coronary artery with other forms of angina pectoris: Secondary | ICD-10-CM

## 2015-01-25 DIAGNOSIS — Z7189 Other specified counseling: Secondary | ICD-10-CM | POA: Insufficient documentation

## 2015-01-25 DIAGNOSIS — Z515 Encounter for palliative care: Secondary | ICD-10-CM | POA: Insufficient documentation

## 2015-01-25 LAB — COMPREHENSIVE METABOLIC PANEL
ALBUMIN: 2 g/dL — AB (ref 3.5–5.0)
ALK PHOS: 104 U/L (ref 38–126)
ALT: 13 U/L — AB (ref 14–54)
AST: 13 U/L — AB (ref 15–41)
Anion gap: 9 (ref 5–15)
BILIRUBIN TOTAL: 0.9 mg/dL (ref 0.3–1.2)
BUN: 67 mg/dL — AB (ref 6–20)
CALCIUM: 9.1 mg/dL (ref 8.9–10.3)
CO2: 21 mmol/L — ABNORMAL LOW (ref 22–32)
Chloride: 114 mmol/L — ABNORMAL HIGH (ref 101–111)
Creatinine, Ser: 2.68 mg/dL — ABNORMAL HIGH (ref 0.44–1.00)
GFR calc Af Amer: 19 mL/min — ABNORMAL LOW (ref >60)
GFR calc non Af Amer: 16 mL/min — ABNORMAL LOW (ref >60)
GLUCOSE: 252 mg/dL — AB (ref 65–99)
POTASSIUM: 3.3 mmol/L — AB (ref 3.5–5.1)
Sodium: 144 mmol/L (ref 135–145)
TOTAL PROTEIN: 5.3 g/dL — AB (ref 6.5–8.1)

## 2015-01-25 LAB — GLUCOSE, CAPILLARY
GLUCOSE-CAPILLARY: 214 mg/dL — AB (ref 65–99)
GLUCOSE-CAPILLARY: 266 mg/dL — AB (ref 65–99)
Glucose-Capillary: 203 mg/dL — ABNORMAL HIGH (ref 65–99)
Glucose-Capillary: 197 mg/dL — ABNORMAL HIGH (ref 65–99)

## 2015-01-25 MED ORDER — LORAZEPAM 1 MG PO TABS: 1.0000 mg | ORAL_TABLET | ORAL | Status: DC | PRN

## 2015-01-25 MED ORDER — LORAZEPAM 2 MG/ML IJ SOLN: 1.0000 mg | INTRAMUSCULAR | Status: DC | PRN

## 2015-01-25 MED ORDER — ONDANSETRON HCL 4 MG/2ML IJ SOLN: 4.0000 mg | Freq: Four times a day (QID) | INTRAMUSCULAR | Status: DC | PRN

## 2015-01-25 MED ORDER — HALOPERIDOL LACTATE 5 MG/ML IJ SOLN: 0.5000 mg | INTRAMUSCULAR | Status: DC | PRN

## 2015-01-25 MED ORDER — LORAZEPAM 2 MG/ML PO CONC: 1.0000 mg | ORAL | Status: DC | PRN

## 2015-01-25 MED ORDER — HALOPERIDOL 1 MG PO TABS: 0.5000 mg | ORAL_TABLET | ORAL | Status: DC | PRN

## 2015-01-25 MED ORDER — GLYCOPYRROLATE 0.2 MG/ML IJ SOLN
0.2000 mg | INTRAMUSCULAR | Status: DC | PRN
  Filled 2015-01-25: qty 1

## 2015-01-25 MED ORDER — HYDRALAZINE HCL 20 MG/ML IJ SOLN: 10.0000 mg | INTRAMUSCULAR | Status: DC | PRN

## 2015-01-25 MED ORDER — POLYVINYL ALCOHOL 1.4 % OP SOLN
1.0000 [drp] | Freq: Four times a day (QID) | OPHTHALMIC | Status: DC | PRN
Start: 2015-01-25 — End: 2015-01-26
  Filled 2015-01-25: qty 15

## 2015-01-25 MED ORDER — GLYCOPYRROLATE 1 MG PO TABS
1.0000 mg | ORAL_TABLET | ORAL | Status: DC | PRN
  Filled 2015-01-25: qty 1

## 2015-01-25 MED ORDER — BISACODYL 10 MG RE SUPP: 10.0000 mg | Freq: Every day | RECTAL | Status: DC | PRN

## 2015-01-25 MED ORDER — HYDROMORPHONE HCL 1 MG/ML IJ SOLN: 0.2000 mg | INTRAMUSCULAR | Status: DC | PRN

## 2015-01-25 MED ORDER — SODIUM CHLORIDE 0.9 % IV SOLN
INTRAVENOUS | Status: DC
  Administered 2015-01-25: 75 mL/h via INTRAVENOUS

## 2015-01-25 MED ORDER — BIOTENE DRY MOUTH MT LIQD: 15.0000 mL | OROMUCOSAL | Status: DC | PRN

## 2015-01-25 MED ORDER — HALOPERIDOL LACTATE 2 MG/ML PO CONC
0.5000 mg | ORAL | Status: DC | PRN
  Filled 2015-01-25: qty 0.3

## 2015-01-25 NOTE — Social Work (Signed)
CSW visited patient's room. Patient was asleep and not easily aroused. CSW attempted to call the niece but no answer and non-descript vm. No message left. Weekday CSW will follow.  Christene Lye MSW, [place]

## 2015-01-25 NOTE — Progress Notes (Signed)
Daily Progress Note   Patient Name: Joyce Terry       Date: 01/25/2015 DOB: December 07, 1935  Age: 79 y.o. MRN#: [id number] Attending Physician: Nita Sells, MD Primary Care Physician: Geoffery Lyons, MD Admit Date: 01/19/2015  Reason for Consultation/Follow-up: Disposition and Establishing goals of care  Subjective: 79 y/o female with PMHx of Atrial fibrillation (on Coumadin), Bradycardia secondary to heart block status post PPM, prior GI bleed-history, HTN, T2 DM, CAD status post stent mid LAD , transitional cell CA S/P TURP, Lumbar degenerative disc disease who was admitted following development of aphasia and marked facial droop with right-sided weakness and found to have L MCA CVA. She initially failed rpt swallow evals but cleared on 10/27 for Dys 1 diet. Over the past couple of days she has become more somnolent with no significant PO intake. Palliative consulted for goals of care.  Interval Events: I met today with Ms. Huneycutt niece, her niece's girlfriend, and Ms. Ferrufino's close friend of more than 40 years.  We discussed her clinical course to this point in time as well as options moving forward for her care.   Her family is in agreement that she has been very clear that she would find the prospect of potentially living anywhere but home unacceptable and also that she would never want to have a tube placed for artificial nutrition and hydration.  They are in agreement that Ms. Vandenberghe would elect to focus her care only on her comfort if she were to understand her current clinical condition.  Length of Stay: 6 days  Current Medications: Scheduled Meds:     Continuous Infusions:    PRN Meds: antiseptic oral rinse, bisacodyl, glycopyrrolate **OR** glycopyrrolate **OR** glycopyrrolate, haloperidol **OR** haloperidol **OR** haloperidol lactate, HYDROmorphone (DILAUDID) injection, LORazepam **OR** LORazepam **OR** LORazepam, polyvinyl alcohol, RESOURCE THICKENUP  CLEAR  Palliative Performance Scale: 10%     Vital Signs: BP 157/58 mmHg  Pulse 70  Temp(Src) 98.4 F (36.9 C) (Axillary)  Resp 18  Ht _0  (1.702 m)  Wt 81.8 kg (180 lb 5.4 oz)  BMI 28.24 kg/m2  SpO2 100% SpO2: SpO2: 100 % O2 Device: O2 Device: Not Delivered O2 Flow Rate:    Intake/output summary:  Intake/Output Summary (Last 24 hours) at 01/25/15 1637 Last data filed at 01/25/15 0800  Gross per 24 hour  Intake      0 ml  Output      0 ml  Net      0 ml   LBM:   Baseline Weight: Weight: 81.8 kg (180 lb 5.4 oz) Most recent weight: Weight: 81.8 kg (180 lb 5.4 oz)  Physical Exam: Constitutional: No distress.  Does not arouse to verbal or tactile stimulation  HENT:  Head: Normocephalic and atraumatic.  Eyes: Right eye exhibits no discharge. Left eye exhibits no discharge. No scleral icterus.  Neck: No JVD present. No tracheal deviation present.  Cardiovascular: Normal rate.  Murmur heard. Respiratory: No respiratory distress. She has no wheezes.  GI: She exhibits no distension. There is no tenderness.  Musculoskeletal: She exhibits no edema.  Neurological:  Unable to assess  Skin: Skin is warm and dry. She is not diaphoretic.               Additional Data Reviewed: Recent Labs     01/23/15  0219  01/25/15  0615  NA  145  144  BUN  41*  67*  CREATININE  2.15*  2.68*     Problem List:  Patient Active Problem List   Diagnosis Date Noted  . CVA (cerebral infarction)   . Iron deficiency anemia   . Pressure ulcer 01/20/2015  . Type 2 diabetes mellitus with circulatory disorder ([place])   . HLD (hyperlipidemia)   . CVA (cerebrovascular accident) ([place]) 01/19/2015  . DKA (diabetic ketoacidoses) ([place]) 01/19/2015  . Acidosis 01/19/2015  . Acute renal failure superimposed on stage 3 chronic kidney disease ([place]) 01/19/2015  . UTI (lower urinary tract infection) 01/19/2015  . Anemia   . CHF (congestive heart failure) ([place])   . Bladder cancer ([place]) 08/29/2014   . Orthostasis 04/09/2014  . Dehydration 06/23/2013  . Angina, class III ([place]) 03/17/2012  . Angina decubitus ([place]) 02/28/2012  . Cough due to ACE inhibitor 06/01/2011  . Sinoatrial node dysfunction ([facility])   . CAD (coronary artery disease) 02/15/2011  . Dyspnea 01/26/2011  . Chronic diastolic CHF (congestive heart failure) ([place]) 09/20/2010  . OBESITY, MORBID 03/03/2010  . Atrial fibrillation ([place]) 03/03/2010  . PPM-[employer]tr[employer] 03/03/2010  . DM 03/04/2009  . ANEMIA-IRON DEFICIENCY 03/04/2009  . OTHER AND UNSPECIFIED COAGULATION DEFECTS 03/04/2009  . GERD 03/04/2009  . PERSONAL HX COLONIC POLYPS 03/04/2009  . RECTAL BLEEDING 08/22/2007  . DYSPHAGIA 08/22/2007     Palliative Care Assessment & Plan    Code Status:  DNR  Goals of Care:  I met today with the patient's niece, Candelaria Stagers, her niece's girlfriend (who also serves as caregiver at times), and the patient's best friend, Albertha Ghee. They are in agreement that Ms. Wynetta Emery valued her independence greatly and that her wishes based upon her current condition would be to focus her care strictly on her comfort.  We also talked about the fact that she would not want to be in the hospital and safe options for discharge in order to focus on this goal while also ensuring good end-of-life care. They are in agreement that Ms. Heatley would best be served by pursuing placement at [facility] and that this would be her wish.  We'll plan to transition to comfort care only and pursue placement at [facility].  Symptom Management:  Currently, she is resting comfortably. There is no meaningful interaction. Will plan for comfort medications for end-of-life care.  Pain or shortness of breath: Hydromorphone as needed  Nausea: Zofran as needed  Agitation: Haldol as needed  Excess secretions: Robinul as needed  Anxiety: Ativan as needed  Palliative Prophylaxis:  Dulcolax as needed  Psycho-social/Spiritual:  Desire for  further Chaplaincy support:No   Prognosis: Hours - Days. Ms. Daw had a devastating stroke and has subsequently deteriorated to the point where she is not able to maintain her own nutrition and hydration. Based on lab work today, her creatinine continues to climb. She has not had any by mouth intake for the past 2 days. Without being able to maintain her nutrition and hydration, her expected prognosis would be less than 2 weeks. She also remains at high risk for aspiration which may cause an even more rapid deterioration and subsequent death. Discharge Planning: Hospice facility   Care plan was discussed with patient's family, bedside nurse, and Dr. Verlon Au  Thank you for allowing the Palliative Medicine Team to assist in the care of this patient.   Time In: 1430 Time Out: 1520 Total Time 50 Prolonged Time Billed no    Greater than 50%  of this time was spent counseling and coordinating care related to the above assessment and plan.   Micheline Rough, MD  01/25/2015,  4:37 PM  Please contact Palliative Medicine Team phone at [phone] for questions and concerns.

## 2015-01-25 NOTE — Social Work (Signed)
CSW spoke with patient's niece who stated that patient is now seeking residential hospice. CSW initiated referral for [facility]. Oncall RN recommended contacting hospital liaison in am on 01/26/15.  Christene Lye MSW, [place]

## 2015-01-25 NOTE — Progress Notes (Signed)
Joyce Terry MEQ:[id number] DOB: 13-May-1935 DOA: 01/19/2015 PCP: Geoffery Lyons, MD  Brief narrative: 79 y/o ? Atrial fibrillation Chad2vasc2 =4 on Coumadin Bradycardia + heart block status post PPM Prior GI bleed-history of internal hemorrhoids, small AVM cecum, left colonic diverticulosis HTN TY2 DM Known CAD status post stent mid LAD 01/2011. 02/2012 Transitional cell CA S/P TURP + stent placement 08/29/14-not candidate for cisplatin/cystectomy secondary to frailty and declined i overall condition as well as recent weight loss 60 pounds Lumbar degenerative disc disease  On admission found to have WBC 20, hemoglobin 9, platelets 323, BUN/creatinine 0.7/2.3-baseline creatinine around 2  Admitted from home with aphasia and marked facial droop with right-sided weakness initially npo as failed rpt swallow evals but cleared on 10/27 for Dys 1 diet  She has failed to make a meaningful recovery or awaken and palliative medicine was consulted to delineate [place]   Past medical history-As per Problem list Chart reviewed as below-   Consultants:  Neuro  Procedures:  Ct head  Carotids  Echo p  Antibiotics:    Subjective   Not awake or oriented Nursing states no change for the past 24-48 hr Not taking in very much    Objective    Interim History:   Telemetry: nsr   Objective: Filed Vitals:   01/24/15 2306 01/25/15 0200 01/25/15 1018 01/25/15 1411  BP: 142/84 124/48 166/57 157/58  Pulse: 70 71 72 70  Temp: 98.4 F (36.9 C) 97.8 F (36.6 C) 97.9 F (36.6 C) 98.4 F (36.9 C)  TempSrc: Oral Axillary Axillary Axillary  Resp: 20 16 16 18   Height:      Weight:      SpO2: 99% 98% 100% 100%    Intake/Output Summary (Last 24 hours) at 01/25/15 1416 Last data filed at 01/25/15 0800  Gross per 24 hour  Intake      0 ml  Output      0 ml  Net      0 ml    Exam:  General: eomi, noted-non-coop c exam  Cardiovascular: s1 s2 no m/r/g Respiratory: clear no  added sound Neuro sleepy-cannot examine fully.  She passively keeps her hand raised when i rais eit up  Data Reviewed: Basic Metabolic Panel:  Recent Labs Lab 01/20/15 0114 01/20/15 0648 01/22/15 1015 01/23/15 0219 01/25/15 0615  NA 135 132* 145 145 144  K 4.7 4.2 3.5 3.3* 3.3*  CL 101 99* 112* 110 114*  CO2 21* 22 23 24  21*  GLUCOSE 93 244* 215* 195* 252*  BUN 46* 49* 35* 41* 67*  CREATININE 1.83* 2.04* 1.70* 2.15* 2.68*  CALCIUM 9.1 8.6* 9.2 9.1 9.1   Liver Function Tests:  Recent Labs Lab 01/19/15 1230 01/25/15 0615  AST 17 13*  ALT 13* 13*  ALKPHOS 125 104  BILITOT 1.7* 0.9  PROT 5.8* 5.3*  ALBUMIN 2.4* 2.0*   No results for input(s): LIPASE, AMYLASE in the last 168 hours. No results for input(s): AMMONIA in the last 168 hours. CBC:  Recent Labs Lab 01/19/15 1230 01/19/15 1815  WBC 24.5* 20.6*  NEUTROABS 22.8*  --   HGB 9.9* 9.8*  HCT 33.8* 32.5*  MCV 71.9* 69.0*  PLT 312 323   Cardiac Enzymes:  Recent Labs Lab 01/19/15 1230  TROPONINI 0.03   BNP: Invalid input(s): POCBNP CBG:  Recent Labs Lab 01/24/15 2111 01/25/15 0131 01/25/15 0432 01/25/15 0620 01/25/15 1134  GLUCAP 251* 203* 197* 214* 266*    Recent Results (from the past  240 hour(s))  MRSA PCR Screening     Status: None   Collection Time: 01/19/15  5:12 PM  Result Value Ref Range Status   MRSA by PCR NEGATIVE NEGATIVE Final    Comment:        The GeneXpert MRSA Assay (FDA approved for NASAL specimens only), is one component of a comprehensive MRSA colonization surveillance program. It is not intended to diagnose MRSA infection nor to guide or monitor treatment for MRSA infections.      Studies:              All Imaging reviewed and is as per above notation   Scheduled Meds: .  stroke: mapping our early stages of recovery book   Does not apply Once  . antiseptic oral rinse  7 mL Mouth Rinse q12n4p  . aspirin  325 mg Oral Daily  . carvedilol  6.25 mg Oral BID WC    . chlorhexidine  15 mL Mouth Rinse BID  . ezetimibe-simvastatin  1 tablet Oral QHS  . heparin  5,000 Units Subcutaneous 3 times per day  . Influenza vac split quadrivalent PF  0.5 mL Intramuscular Tomorrow-1000  . insulin aspart  0-15 Units Subcutaneous 6 times per day  . insulin aspart  3 Units Subcutaneous Q4H  . insulin glargine  12 Units Subcutaneous QHS  . mometasone-formoterol  2 puff Inhalation BID  . pneumococcal 23 valent vaccine  0.5 mL Intramuscular Tomorrow-1000   Continuous Infusions:     Assessment/Plan:   1. Acute CVA, L MCA-Unable to give NOAC or coumadin.  Family state spatient would not have wanted aggressive medical care.  Passed MBS. appreciate palliative input.--NO AC until 10/29 given size of infarct.  Not tol po meds.  ?NOAC  2. Dysphagia-SLP graduated to Dys 1 diet.  Monitor for cough and signs aspiration.  Portable cxr 10/30=no PNA 3. HTn Urgency  schedule hydralazine 5 mg iv q4 prn for Pressure above 150/90-added back Coreg 3.125 bid-->6.25 10.28.16--poorly controlled bp as not taking PO. 4. AKI/acute renal failure on admission-bun/creat 47/2.55-->35/1.77-->68/2.6.  Saline locked but will keep hydrated overnight and see if this changes mentation-this wouldn't change overall bleak prognosis 5. Abd + L Hip pain-L hip xrays show no anomaly.   watch for dark/tarry stool. 6. Pyelonephritis-Ua small leuk, neg nitrites.  stopped Cipro after 3 days 10/27 7. DKA-found on admission-now resolved. 8. DM ty II-change to qid achs  Glucose is 197-266 range.  Cont Lantus 7 U qhs and changed to modertae SSI with q4 insuin 3 u Home meds include glyburide 5 am, metformin 1000 bid Afib RVR Chad2vasc2=4-cannot keep on coumadin for now-->NOAC on 10/29.  Start ASA po 10/27 as a bridge for now.  ? Coreg dose to 6.25 bid 9. Transitional Ca bladder s/p TURB-non-op candidate 10. Lumbar DDD-hold pain meds.  Stable med condition.  Hold Fentanyl 50 mcg q 72 11. Prior GI  bleed-stable 12. Cad with stents 2012/2013-consider Losartan25 daily/Coreg 3.125 bid when able tot take PO  Code Status: DNR Family Communication: no family +.  pallaitive discussing [place] with family Disposition Plan: likely full comfort or snf with palliative to follow in next 24-48 hr DVT prophylaxis: Lovenox Consultants: Neuro, pcm  Verneita Griffes, MD  [facility] Pager [phone] 01/25/2015, 2:16 PM    LOS: 6 days

## 2015-01-26 DIAGNOSIS — I63011 Cerebral infarction due to thrombosis of right vertebral artery: Secondary | ICD-10-CM

## 2015-01-26 MED ORDER — HALOPERIDOL LACTATE 2 MG/ML PO CONC: 1.0000 mg | ORAL | Status: AC | PRN

## 2015-01-26 MED ORDER — LORAZEPAM 2 MG/ML PO CONC: 1.0000 mg | ORAL | Status: AC | PRN

## 2015-01-26 NOTE — Discharge Summary (Signed)
Physician Discharge Summary  Joyce Terry RWE:[id number] DOB: 05-07-1935 DOA: 01/19/2015  PCP: Geoffery Lyons, MD  Admit date: 01/19/2015 Discharge date: 01/26/2015  Time spent: 25 minutes  Recommendations for Outpatient Follow-up:  1. Patient will be discharged to freestanding hospice facility for end-of-life care 2. No further labs nor workup 3. Prognosis is very guarded  Discharge Diagnoses:  Principal Problem:   CVA (cerebrovascular accident) ([place]) Active Problems:   OBESITY, MORBID   Atrial fibrillation (HCC)   Chronic diastolic CHF (congestive heart failure) (HCC)   CAD (coronary artery disease)   Bladder cancer (HCC)   DKA (diabetic ketoacidoses) (HCC)   Acidosis   Acute renal failure superimposed on stage 3 chronic kidney disease (HCC)   UTI (lower urinary tract infection)   Pressure ulcer   Type 2 diabetes mellitus with circulatory disorder (HCC)   HLD (hyperlipidemia)   CVA (cerebral infarction)   Iron deficiency anemia   Palliative care encounter   Goals of care, counseling/discussion   Discharge Condition: Guarded  Diet recommendation: Dysphagia 1 if able to tolerate diet  Filed Weights   01/20/15 0800  Weight: 81.8 kg (180 lb 5.4 oz)    History of present illness:  79 y/o ? Atrial fibrillation Chad2vasc2 =4 on Coumadin Bradycardia + heart block status post PPM Prior GI bleed-history of internal hemorrhoids, small AVM cecum, left colonic diverticulosis HTN TY2 DM Known CAD status post stent mid LAD 01/2011. 02/2012 Transitional cell CA S/P TURP + stent placement 08/29/14-not candidate for cisplatin/cystectomy secondary to frailty and declined i overall condition as well as recent weight loss 60 pounds Lumbar degenerative disc disease  On admission found to have WBC 20, hemoglobin 9, platelets 323, BUN/creatinine 0.7/2.3-baseline creatinine around 2  Admitted from home with aphasia and marked facial droop with right-sided weakness initially npo  as failed rpt swallow evals but cleared on 10/27 for Dys 1 diet  She has failed to make a meaningful recovery or awaken and palliative medicine was consulted to delineate [facility] Course:  ment/Plan:   1. Acute CVA, L MCA-Unable to give NOAC or coumadin. Family state spatient would not have wanted aggressive medical care. Passed MBS. appreciate palliative input.--Because of poor recovery and no meaningful change in status we decided to simplify all meds suggest essential comfort medications after palliative care discussed with the patient and family on 10/30 2. Dysphagia-SLP graduated to Dys 1 diet. Monitor for cough and signs aspiration. Portable cxr 10/30=no PNA 3. HTn Urgency schedule hydralazine 5 mg iv q4 prn for Pressure above 150/90-added back Coreg 3.125 bid-->6.25 10.28.16--poorly controlled bp as not taking PO.-Would not aggressively control 4. AKI/acute renal failure on admission-bun/creat 47/2.55-->35/1.77-->68/2.6. Saline locked but will keep hydrated overnight and see if this changes mentation-this wouldn't change overall bleak prognosis 5. Abd + L Hip pain-L hip xrays show no anomaly. watch for dark/tarry stool. 6. Pyelonephritis-Ua small leuk, neg nitrites. stopped Cipro after 3 days 10/27 7. DKA-found on admission-now resolved. 8. DM ty II-change to qid achs Glucose is 197-266 range. Initially was on Lantus 7 U qhs and changed to modertae SSI with q4 insuin 3 u Home meds include glyburide 5 am, metformin 1000 bid--we have discontinued all medications on discharge 14. Afib RVR Chad2vasc2=4-cannot keep on coumadin for now-->NOAC on 10/29. Start ASA po 10/27 as a bridge for now. ? Coreg dose to 6.25 bid-not able to take by mouth meds so do not control aggressively 9. Transitional Ca bladder s/p TURB-non-op candidate 10. Lumbar DDD-hold pain meds.  Stable med condition. Hold Fentanyl 50 mcg q 72 11. Prior GI bleed-stable 12. Cad with stents 2012/2013-consider  Losartan25 daily/Coreg 3.125 bid when able tot take PO  Overall prognosis is bleak. Patient has been discharged to freestanding hospice facility on Haldol and Ativan for agitation and it is unlikely patient will make a meaningful recovery  Discharge Exam: Filed Vitals:   01/26/15 0550  BP: 138/40  Pulse: 70  Temp: 98.4 F (36.9 C)  Resp: 18   Sleep not alert oriented cannot arouse EOMI NCAT S1-S2 no murmur rub or gallop Has mild tenderness to abdomen No meaningful movement to limbs   Discharge Instructions   Discharge Instructions    Diet - low sodium heart healthy    Complete by:  As directed      Increase activity slowly    Complete by:  As directed           Current Discharge Medication List    START taking these medications   Details  haloperidol (HALDOL) 2 MG/ML solution Place 0.5 mLs (1 mg total) under the tongue every 4 (four) hours as needed for agitation (or delirium). Qty: 15 mL, Refills: 0    LORazepam (ATIVAN) 2 MG/ML concentrated solution Place 0.5 mLs (1 mg total) under the tongue every 4 (four) hours as needed for anxiety. Qty: 30 mL, Refills: 0      CONTINUE these medications which have NOT CHANGED   Details  carvedilol (COREG) 3.125 MG tablet Take 1 tablet (3.125 mg total) by mouth 2 (two) times daily with a meal. Qty: 60 tablet, Refills: 11      STOP taking these medications     B-D UF III MINI PEN NEEDLES 31G X 5 MM MISC      calcium-vitamin D (OSCAL WITH D) 500-200 MG-UNIT per tablet      docusate sodium (COLACE) 100 MG capsule      escitalopram (LEXAPRO) 10 MG tablet      ezetimibe-simvastatin (VYTORIN) 10-20 MG per tablet      fentaNYL (DURAGESIC - DOSED MCG/HR) 50 MCG/HR      Fluticasone-Salmeterol (ADVAIR) 250-50 MCG/DOSE AEPB      glyBURIDE (DIABETA) 5 MG tablet      insulin aspart (NOVOLOG) 100 UNIT/ML injection      insulin glargine (LANTUS) 100 UNIT/ML injection      losartan (COZAAR) 25 MG tablet      metFORMIN  (GLUCOPHAGE) 500 MG tablet      OVER THE COUNTER MEDICATION      oxyCODONE-acetaminophen (PERCOCET) 10-325 MG per tablet      pantoprazole (PROTONIX) 40 MG tablet      phenazopyridine (PYRIDIUM) 200 MG tablet      rOPINIRole (REQUIP) 0.25 MG tablet      torsemide (DEMADEX) 20 MG tablet      warfarin (COUMADIN) 5 MG tablet      zolpidem (AMBIEN) 10 MG tablet      meclizine (ANTIVERT) 25 MG tablet        Allergies  Allergen Reactions  . Demerol Nausea And Vomiting  . Penicillins Hives  . Tape Other (See Comments)    "surgical tape tears skin up; paper tape is ok"  . Morphine And Related Itching  . Codeine Nausea And Vomiting   Follow-up Information    Follow up with Xu,Jindong, MD. Schedule an appointment as soon as possible for a visit in 2 months.   Specialty:  Neurology   Why:  stroke clinic   Contact information:  [address] [place] [phone]        The results of significant diagnostics from this hospitalization (including imaging, microbiology, ancillary and laboratory) are listed below for reference.    Significant Diagnostic Studies: Dg Chest 2 View  01/19/2015  CLINICAL DATA:  Patient with history of stroke. EXAM: CHEST  2 VIEW COMPARISON:  Chest radiograph 12/22/2014 FINDINGS: Single lead pacer apparatus overlies the left hemi thorax. Marked cardiomegaly. Low lung volumes. Consolidative pulmonary opacities. No pleural effusion or pneumothorax. Mid thoracic spine degenerative changes. IMPRESSION: Cardiomegaly.  No acute cardiopulmonary process. Electronically Signed   By: Lovey Newcomer M.D.   On: 01/19/2015 19:50   Ct Head Wo Contrast  01/20/2015  CLINICAL DATA:  Speech disturbance.  Stroke. EXAM: CT HEAD WITHOUT CONTRAST TECHNIQUE: Contiguous axial images were obtained from the base of the skull through the vertex without intravenous contrast. COMPARISON:  Head CT and CT cerebral perfusion 01/19/2015 FINDINGS: There has  been interval development of cytotoxic edema in the posterior left MCA territory involving the insula, subinsular white matter, superior temporal lobe, and parietal lobe. Much of this corresponds to the area of core infarct seen on recent perfusion, although some of the penumbra on the prior study has progressed to infarction. Punctate hyperdense focus in the left sylvian fissure likely reflects thrombus within a left M2 MCA vessel corresponding to the area of infarction. There is no evidence of acute intracranial hemorrhage, mass, midline shift, or extra-axial fluid collection. There is mild generalized cerebral atrophy and mild-to-moderate chronic small vessel ischemic disease in the cerebral white matter. Orbits are unremarkable. The paranasal sinuses and mastoid air cells are clear. Mild calcified atherosclerosis is noted at the skullbase. IMPRESSION: Evolving, moderate-sized left MCA infarct. No intracranial hemorrhage. Electronically Signed   By: Logan Bores M.D.   On: 01/20/2015 13:14   Ct Head Wo Contrast  01/19/2015  CLINICAL DATA:  Altered mental status beginning last night. Initial encounter. EXAM: CT HEAD WITHOUT CONTRAST TECHNIQUE: Contiguous axial images were obtained from the base of the skull through the vertex without intravenous contrast. COMPARISON:  None. FINDINGS: There is atrophy and chronic microvascular ischemic change. No evidence of acute intracranial abnormality including hemorrhage, infarct, mass lesion, mass effect, midline shift or abnormal extra-axial fluid collection is identified. No hydrocephalus or pneumocephalus. The calvarium is intact. Imaged paranasal sinuses and mastoid air cells are clear. IMPRESSION: No acute abnormality. Atrophy and chronic microvascular ischemic change. Electronically Signed   By: Inge Rise M.D.   On: 01/19/2015 13:09   Ct Cerebral Perfusion W/cm  01/19/2015  CLINICAL DATA:  Speech disturbance. EXAM: CT CEREBRAL PERFUSION WITH CONTRAST  TECHNIQUE: Before and during injection of intravenous contrast, repeat imaging of the brain was performed from the level of the sella nearly to the vertex and perfusion maps were generated. CONTRAST:  34mL OMNIPAQUE IOHEXOL 350 MG/ML SOLN COMPARISON:  Head CT from earlier today FINDINGS: There is asymmetrically prolonged transit time in the posterior left MCA territory cortex and associated white matter. Approximately half of this ischemic territory shows decreased cerebral blood volume consistent with infarct. On source images, the left M1 segment is symmetrically opacifying. No deep gray nuclei ischemic changes. These results were called by telephone at the time of interpretation on 01/19/2015 at 2:39 pm to [facility], who verbally acknowledged these results. IMPRESSION: Ischemia throughout the posterior half left MCA territory. This ischemic area appears roughly divided between infarct and penumbra. No CTA but the left M1 is symmetrically  patent. Electronically Signed   By: Monte Fantasia M.D.   On: 01/19/2015 14:46   Dg Chest Port 1 View  01/25/2015  CLINICAL DATA:  Pneumonia EXAM: PORTABLE CHEST 1 VIEW COMPARISON:  01/19/2015 FINDINGS: Lungs are essentially clear. Mild right lower lobe opacity, likely atelectasis. No pleural effusion or pneumothorax. The heart is top-normal in size.  Left subclavian pacemaker. IMPRESSION: No evidence of acute cardiopulmonary disease. Electronically Signed   By: Julian Hy M.D.   On: 01/25/2015 09:55   Dg Swallowing Func-speech Pathology  01/22/2015    Objective Swallowing Evaluation:   Patient Details Name: Joyce Terry MRN: [id number] Date of Birth: 1935-07-28 Today's Date: 01/22/2015 Time: SLP Start Time (ACUTE ONLY): 0938-SLP Stop Time (ACUTE ONLY): 0956 SLP Time Calculation (min) (ACUTE ONLY): 18 min Past Medical History: Past Medical History Diagnosis Date . Hypertension  . Atrial fibrillation ([place])  . Obesity  . Coronary artery disease    cardiac  catheterization in 1997 -- Est. EF of 50% . Fatigue  . Stress  . Chest pain  . Peptic ulcer 1985 . GERD (gastroesophageal reflux disease)  . Depression  . Sinoatrial node dysfunction (HCC)  . Gastric polyps  . Colon polyps 05/19/2009   Tubular Adenomous polyps . Diverticulosis  . Endometrial cancer ([place]) 1995 . Hypercholesteremia  . DVT (deep venous thrombosis) ([place]) 1960's   ?RLE (03/16/2012) . Exertional dyspnea    "sometimes" (03/16/2012) . Type II diabetes mellitus ([place])  . Anemia    "iron transfusions ?twice" (03/16/2012) . Internal hemorrhoid, bleeding    "sometimes" (03/16/2012) . Migraines    "when I was going thru menopause; none lately" (03/16/2012) . Arthritis    "qwhere" (03/16/2012) . Chronic back pain  . Angina    occasional, none recent . Presence of permanent cardiac pacemaker  . CHF (congestive heart failure) (HCC)    chronic systolic CHF . Pneumonia 1991, 2012   x2 . 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 Hydronephrosis, right  . History of atrial fibrillation  . Hx of cardiac arrhythmia  . Hx of esophageal reflux  . DKA (diabetic ketoacidoses) ([place])  . CVA (cerebral infarction)  . CKD (chronic kidney disease), stage II  Past Surgical History: Past Surgical History Procedure Laterality Date . Vesicovaginal fistula closure w/ tah  1995 . Joint replacement     LEFT HIP   02/2008 . Cholecystectomy  fall 2001 . Cardiac catheterization  02/21/96; 03/16/2012   Est. EF of 50%; "just to look" (03/16/2012) . Coronary angioplasty with stent placement  02/15/11   "one" . Coronary angioplasty  03/16/2012   "couldn't put stent in cause of where it is" (03/16/2012) . Tonsillectomy  ~ 1962 . Total hip arthroplasty  02/2008   "left" (03/16/2012) . Insert / replace / remove pacemaker  07/23/99   atrial insertion of the AV node -- Medtronic single chamber pacemaker  . Insert / replace / remove pacemaker  2012   "replaced" (03/16/2012) . Ptca  02/2012   Balloon angioplast of jailed diagonal branch by prior LAD stent .  Percutaneous coronary stent intervention (pci-s) N/A 02/15/2011   Procedure: PERCUTANEOUS CORONARY STENT INTERVENTION (PCI-S);  Surgeon: Burnell Blanks, MD;  Location[facility] CATH LAB;  Service: Cardiovascular;  Laterality: N/A; . Pacemaker generator change N/A 03/02/2011   Procedure: PACEMAKER GENERATOR CHANGE;  Surgeon: Evans Lance, MD;  Location: [facility] CATH LAB;  Service: Cardiovascular;  Laterality: N/A; . Left heart catheterization with coronary angiogram N/A 03/16/2012   Procedure: LEFT HEART CATHETERIZATION WITH CORONARY  ANGIOGRAM;  Surgeon: Thayer Headings, MD;  Location[facility] CATH LAB;  Service: Cardiovascular;  Laterality: N/A; . Percutaneous coronary stent intervention (pci-s) N/A 03/16/2012   Procedure: PERCUTANEOUS CORONARY STENT INTERVENTION (PCI-S);  Surgeon: Burnell Blanks, MD;  Location: [facility] CATH LAB;  Service: Cardiovascular;  Laterality: N/A; . Abdominal hysterectomy  1995   "endometrial cancer" (03/16/2012) total  . Dilation and curettage of uterus  late 1950's   "had probably 5; several miscarriages when I was younger" . Transurethral resection of bladder tumor with gyrus (turbt-gyrus) N/A 08/29/2014   Procedure: TRANSURETHRAL RESECTION OF BLADDER TUMOR WITH GYRUS (TURBT-GYRUS);  Surgeon: Ardis Hughs, MD;  Location: [facility] ORS;  Service: Urology;  Laterality: N/A; . Cystoscopy w/ ureteral stent placement Bilateral 08/29/2014   Procedure: BILATERAL RETROGRADE PYELOGRAM /RIGHT URETERAL STENT PLACEMENT;  Surgeon: Ardis Hughs, MD;  Location: [facility] ORS;  Service: Urology;  Laterality: Bilateral; HPI: Other Pertinent Information: CLETUS MEHLHOFF is a 79 y.o. female PMH, DM type 2 insulin-dependent, AFib on Coumadin, hyperlipidemia, HTN, chronic systolic heart failure, bladder cancer remotely was a recurrence this year, DVT. Daughter reports pt was previously independent with ADLs, pt found down at home on 01/19/15 presented to the ED with strokelike symptoms (expressive and receptive  aphasia, right sided weakness). Initial evaluation reveals DKA within an ion gap of 20 and CT cerebral perfusion scan revealing ischemia throughout the posterior half of left MCA territory. This ischemic area appears roughly divided between infarct and penumbra. Thrombus noted in M2 branch.Per MD note, pt appears to have progressing dementia. Pt has language deficits and dysphagia as a result of her cva.  MBS indicated to determine readiness for po intake.   Assessment / Plan / Recommendation CHL IP CLINICAL IMPRESSIONS 01/22/2015 Therapy Diagnosis Mild oral phase dysphagia;Mild pharyngeal phase dysphagia Clinical Impression Testing was limited due to decreased participation from patient.  She did not want to lay on her back and when repositioned for testing, she flailed her arms, yelling out.  SLP was able to view pt swallow only a tsp of pudding and bolus of nectar.   Pt with delayed oral transiting and minimal oral residuals.  Pharyngeal swallow mildly delayed but strong without residuals.  No aspiration present and pt's agitation negatively impacted swallowing test.  Coupling results of clinical testing at bedside and MBS, recommend conservative diet of dys1/honey via tsp and allow single ice chips with strict precautions.    CHL IP TREATMENT RECOMMENDATION 01/20/2015 Treatment Recommendations Therapy as outlined in treatment plan below   CHL IP DIET RECOMMENDATION 01/22/2015 SLP Diet Recommendations Ice chips PRN after oral care;Dysphagia 1 (Puree);Honey Liquid Administration via Tsp only initially  Medication Administration Crushed with puree Compensations Small sips/bites;Slow rate;Check for pocketing Postural Changes and/or Swallow Maneuvers Upright, 90*   CHL IP OTHER RECOMMENDATIONS 01/22/2015 Recommended Consults (None) Oral Care Recommendations Oral care before and after PO Other Recommendations Order thickener from pharmacy;Prohibited food (jello, ice cream, thin soups)   CHL IP FOLLOW UP  RECOMMENDATIONS 01/21/2015 Follow up Recommendations Inpatient Rehab   CHL IP FREQUENCY AND DURATION 01/22/2015 Speech Therapy Frequency (ACUTE ONLY) min 2x/week Treatment Duration 2 weeks     CHL IP REASON FOR REFERRAL 01/22/2015 Reason for Referral Objectively evaluate swallowing function   CHL IP ORAL PHASE 01/22/2015 Oral Phase Impaired   CHL IP PHARYNGEAL PHASE 01/22/2015 Pharyngeal Phase Impaired   CHL IP CERVICAL ESOPHAGEAL PHASE 01/22/2015 Cervical Esophageal Phase [facility]   Luanna Salk, MS [facility] SLP [phone]   Dg Hips  Bilat With Pelvis 2v  01/20/2015  CLINICAL DATA:  Pelvic pain following fall, initial encounter EXAM: DG HIP (WITH OR WITHOUT PELVIS) 2V BILAT COMPARISON:  None. FINDINGS: Left hip prosthesis is noted. No acute fracture or dislocation is seen. Significant degenerative changes of right hip joint are noted. A right ureteral stent is seen. IMPRESSION: No acute abnormality noted. Electronically Signed   By: Inez Catalina M.D.   On: 01/20/2015 14:40    Microbiology: Recent Results (from the past 240 hour(s))  MRSA PCR Screening     Status: None   Collection Time: 01/19/15  5:12 PM  Result Value Ref Range Status   MRSA by PCR NEGATIVE NEGATIVE Final    Comment:        The GeneXpert MRSA Assay (FDA approved for NASAL specimens only), is one component of a comprehensive MRSA colonization surveillance program. It is not intended to diagnose MRSA infection nor to guide or monitor treatment for MRSA infections.      Labs: Basic Metabolic Panel:  Recent Labs Lab 01/20/15 0114 01/20/15 0648 01/22/15 1015 01/23/15 0219 01/25/15 0615  NA 135 132* 145 145 144  K 4.7 4.2 3.5 3.3* 3.3*  CL 101 99* 112* 110 114*  CO2 21* 22 23 24  21*  GLUCOSE 93 244* 215* 195* 252*  BUN 46* 49* 35* 41* 67*  CREATININE 1.83* 2.04* 1.70* 2.15* 2.68*  CALCIUM 9.1 8.6* 9.2 9.1 9.1   Liver Function Tests:  Recent Labs Lab 01/19/15 1230 01/25/15 0615  AST 17 13*  ALT 13* 13*  ALKPHOS  125 104  BILITOT 1.7* 0.9  PROT 5.8* 5.3*  ALBUMIN 2.4* 2.0*   No results for input(s): LIPASE, AMYLASE in the last 168 hours. No results for input(s): AMMONIA in the last 168 hours. CBC:  Recent Labs Lab 01/19/15 1230 01/19/15 1815  WBC 24.5* 20.6*  NEUTROABS 22.8*  --   HGB 9.9* 9.8*  HCT 33.8* 32.5*  MCV 71.9* 69.0*  PLT 312 323   Cardiac Enzymes:  Recent Labs Lab 01/19/15 1230  TROPONINI 0.03   BNP: BNP (last 3 results) No results for input(s): BNP in the last 8760 hours.  ProBNP (last 3 results) No results for input(s): PROBNP in the last 8760 hours.  CBG:  Recent Labs Lab 01/24/15 2111 01/25/15 0131 01/25/15 0432 01/25/15 0620 01/25/15 1134  GLUCAP 251* 203* 197* 214* 266*       Signed:  Nita Sells  [facility] 01/26/2015, 7:31 AM

## 2015-01-26 NOTE — Care Management Note (Signed)
Case Management Note  Patient Details  Name: Joyce Terry MRN: [id number] Date of Birth: 1936/03/03  Subjective/Objective:                    Action/Plan: Patient being discharged to [facility] today. No further needs per CM.   Expected Discharge Date:  01/26/15               Expected Discharge Plan:     In-House Referral:     Discharge planning Services     Post Acute Care Choice:    Choice offered to:     DME Arranged:    DME Agency:     HH Arranged:    [place] Agency:     Status of Service:     Medicare Important Message Given:  Yes-second notification given Date Medicare IM Given:    Medicare IM give by:    Date Additional Medicare IM Given:    Additional Medicare Important Message give by:     If discussed at [place] of Stay Meetings, dates discussed:    Additional Comments:  Pollie Friar, RN 01/26/2015, 2:24 PM

## 2015-01-26 NOTE — Clinical Social Work Note (Signed)
CSW received phone call from Harmon Pier at [facility] who said they can take patient today, around 4pm, because family will not be able to complete paperwork till around 3pm.  Transportation has been scheduled through [facility] for 4pm pick up per [employer].  CSW to continue to follow patient's progress.  Jones Broom. [place], MSW, [place] 01/26/2015 1:03 PM

## 2015-01-26 NOTE — Care Management Important Message (Signed)
Important Message  Patient Details  Name: [name] MRN: [id number] Date of Birth: January 10, 1936   Medicare Important Message Given:  Yes-third notification given    Nathen May 01/26/2015, 5:37 PM

## 2015-01-26 NOTE — Progress Notes (Signed)
[employer] was called at [address] [employer]. Patient have a pacemaker with a model #[id number]. It is [place] pacemaker. Patient does not have a defribillator with her pacemaker.

## 2015-01-26 NOTE — Clinical Social Work Note (Signed)
CSW contacted [employer] for referral, referral has been made [employer] is aware.  Jones Broom. [place], MSW, [place] 01/26/2015 8:36 AM

## 2015-01-26 NOTE — Progress Notes (Signed)
Pt is being discharged to [facility]. Discharge package was given to transporter.  patient received a perineal care and was dressed in a clean paper gown. Report was called and given to [facility].

## 2015-01-26 NOTE — Clinical Social Work Note (Signed)
Patient to be d/c'ed today to [facility].  Patient and family agreeable to plans will transport via ems RN to call report.  Birdella Sippel, MSW, LCSWA [phone]  

## 2015-01-26 NOTE — Progress Notes (Deleted)
Inpatient Diabetes Program Recommendations  AACE/ADA: New Consensus Statement on Inpatient Glycemic Control (2015)  Target Ranges:  Prepandial:   less than 140 mg/dL      Peak postprandial:   less than 180 mg/dL (1-2 hours)      Critically ill patients:  140 - 180 mg/dL   Review of Glycemic Control  Diabetes history: DM 2 Outpatient Diabetes medications: Lantus 10-20 based on CBG, Novolog 4-8 units BID, Metformin 1,000 mg BID Current orders for Inpatient glycemic control:   Inpatient Diabetes Program Recommendations: Insulin - Basal: Glucose in the 200's. Patient takes 10-20 units of basal insulin at home. Please consider starting patient on Lantus 8-10 units Q24hrs. Insulin - Correction: Please consider ordering Novolog Sensitive Correction + HS  Thanks,  Tama Headings RN, MSN, [facility] Inpatient Diabetes Coordinator Team Pager [phone] (8a-5p)

## 2015-01-26 NOTE — Progress Notes (Signed)
Daily Progress Note   Patient Name: Joyce Terry       Date: 01/26/2015 DOB: [birth year]  Age: 79 y.o. MRN#: [id number] Attending Physician: Nita Sells, MD Primary Care Physician: Geoffery Lyons, MD Admit Date: 01/19/2015  Reason for Consultation/Follow-up: Disposition and Establishing goals of care  Subjective: 79 y/o female with PMHx of Atrial fibrillation (on Coumadin), Bradycardia secondary to heart block status post PPM, prior GI bleed-history, HTN, T2 DM, CAD status post stent mid LAD , transitional cell CA S/P TURP, Lumbar degenerative disc disease who was admitted following development of aphasia and marked facial droop with right-sided weakness and found to have L MCA CVA. She initially failed rpt swallow evals but cleared on 10/27 for Dys 1 diet. Over the past couple of days she has become more somnolent with no significant PO intake. Palliative consulted for goals of care.  Following meeting with Ms. Salvaggio niece and friends, she was transitioned to comfort care with plan to pursue placement at [facility].  Interval Events: Ms. Wain was lying in bed.  No distress.  Did not arouse to verbal or tactile stimulation.   Length of Stay: 7 days  Current Medications: Scheduled Meds:     Continuous Infusions:    PRN Meds: antiseptic oral rinse, bisacodyl, glycopyrrolate **OR** glycopyrrolate **OR** glycopyrrolate, haloperidol **OR** haloperidol **OR** haloperidol lactate, HYDROmorphone (DILAUDID) injection, LORazepam **OR** LORazepam **OR** LORazepam, ondansetron (ZOFRAN) IV, polyvinyl alcohol, RESOURCE THICKENUP CLEAR  Palliative Performance Scale: 10%     Vital Signs: BP 138/40 mmHg  Pulse 70  Temp(Src) 98.4 F (36.9 C) (Axillary)  Resp 18  Ht 5\' 7"  (1.702 m)  Wt 81.8 kg (180 lb 5.4 oz)  BMI 28.24 kg/m2  SpO2 97% SpO2: SpO2: 97 % O2 Device: O2 Device: Not Delivered O2 Flow Rate:    Intake/output summary: No intake or output data in the 24 hours  ending 01/26/15 1004 LBM:   Baseline Weight: Weight: 81.8 kg (180 lb 5.4 oz) Most recent weight: Weight: 81.8 kg (180 lb 5.4 oz)  Physical Exam: Constitutional: No distress.  Does not arouse to verbal or tactile stimulation  HENT:  Head: Normocephalic and atraumatic.  Eyes: Right eye exhibits no discharge. Left eye exhibits no discharge.  Neck: No JVD present. No tracheal deviation present.  Cardiovascular: Normal rate. Respiratory: No respiratory distress. She has no wheezes.  GI: She exhibits no distension. There is no tenderness.  Musculoskeletal: She exhibits no edema.  Neurological:  Unable to assess  Skin: Skin is warm and dry. She is not diaphoretic.               Additional Data Reviewed: Recent Labs     01/25/15  0615  NA  144  BUN  67*  CREATININE  2.68*     Problem List:  Patient Active Problem List   Diagnosis Date Noted  . Palliative care encounter   . Goals of care, counseling/discussion   . CVA (cerebral infarction)   . Iron deficiency anemia   . Pressure ulcer 01/20/2015  . Type 2 diabetes mellitus with circulatory disorder ([place])   . HLD (hyperlipidemia)   . CVA (cerebrovascular accident) ([place]) 01/19/2015  . DKA (diabetic ketoacidoses) ([place]) 01/19/2015  . Acidosis 01/19/2015  . Acute renal failure superimposed on stage 3 chronic kidney disease ([place]) 01/19/2015  . UTI (lower urinary tract infection) 01/19/2015  . Anemia   . CHF (congestive heart failure) ([place])   . Bladder cancer ([place]) 08/29/2014  . Orthostasis 04/09/2014  .  Dehydration 06/23/2013  . Angina, class III ([place]) 03/17/2012  . Angina decubitus ([place]) 02/28/2012  . Cough due to ACE inhibitor 06/01/2011  . Sinoatrial node dysfunction ([facility])   . CAD (coronary artery disease) 02/15/2011  . Dyspnea 01/26/2011  . Chronic diastolic CHF (congestive heart failure) ([place]) 09/20/2010  . OBESITY, MORBID 03/03/2010  . Atrial fibrillation ([place]) 03/03/2010  . PPM-Medtronic 03/03/2010  . DM  03/04/2009  . ANEMIA-IRON DEFICIENCY 03/04/2009  . OTHER AND UNSPECIFIED COAGULATION DEFECTS 03/04/2009  . GERD 03/04/2009  . PERSONAL HX COLONIC POLYPS 03/04/2009  . RECTAL BLEEDING 08/22/2007  . DYSPHAGIA 08/22/2007     Palliative Care Assessment & Plan    Code Status:  DNR  Goals of Care:  Comfort care only and pursue placement at [facility].  Symptom Management:  Currently, she is resting comfortably. There is no meaningful interaction. Will plan for comfort medications for end-of-life care.  Pain or shortness of breath: Hydromorphone as needed  Nausea: Zofran as needed  Agitation: Haldol as needed  Excess secretions: Robinul as needed  Anxiety: Ativan as needed  Palliative Prophylaxis:  Dulcolax as needed  Psycho-social/Spiritual:  Desire for further Chaplaincy support:No   Prognosis: Hours - Days. Ms. Rouse had a devastating stroke and has subsequently deteriorated to the point where she is not able to maintain her own nutrition and hydration. Her creatinine has continued to climb. She has not had any by mouth intake for >2 days. Without being able to maintain her nutrition and hydration, her expected prognosis would be less than 2 weeks. She also remains at high risk for aspiration which may cause an even more rapid deterioration and subsequent death. Discharge Planning: Hospice facility  Thank you for allowing the Palliative Medicine Team to assist in the care of this patient.   Time In: 0935 Time Out: 0955 Total Time 20 Prolonged Time Billed no    Greater than 50%  of this time was spent counseling and coordinating care related to the above assessment and plan.   Micheline Rough, MD  01/26/2015, 10:04 AM  Please contact Palliative Medicine Team phone at [phone] for questions and concerns.

## 2015-02-09 ENCOUNTER — Ambulatory Visit: Payer: Medicare Other | Admitting: Cardiovascular Disease

## 2015-02-09 DIAGNOSIS — R0989 Other specified symptoms and signs involving the circulatory and respiratory systems: Secondary | ICD-10-CM

## 2015-02-12 ENCOUNTER — Encounter: Payer: Self-pay | Admitting: Cardiovascular Disease

## 2015-02-26 DEATH — deceased

## 2016-05-30 IMAGING — DX DG HIP (WITH OR WITHOUT PELVIS) 2V BILAT
5 series · 5 of 5 positions shown · non-contrast
Comparison: None.

CLINICAL DATA: Pelvic pain following fall, initial encounter

EXAM:
DG HIP (WITH OR WITHOUT PELVIS) 2V BILAT

[t pelvis ap]
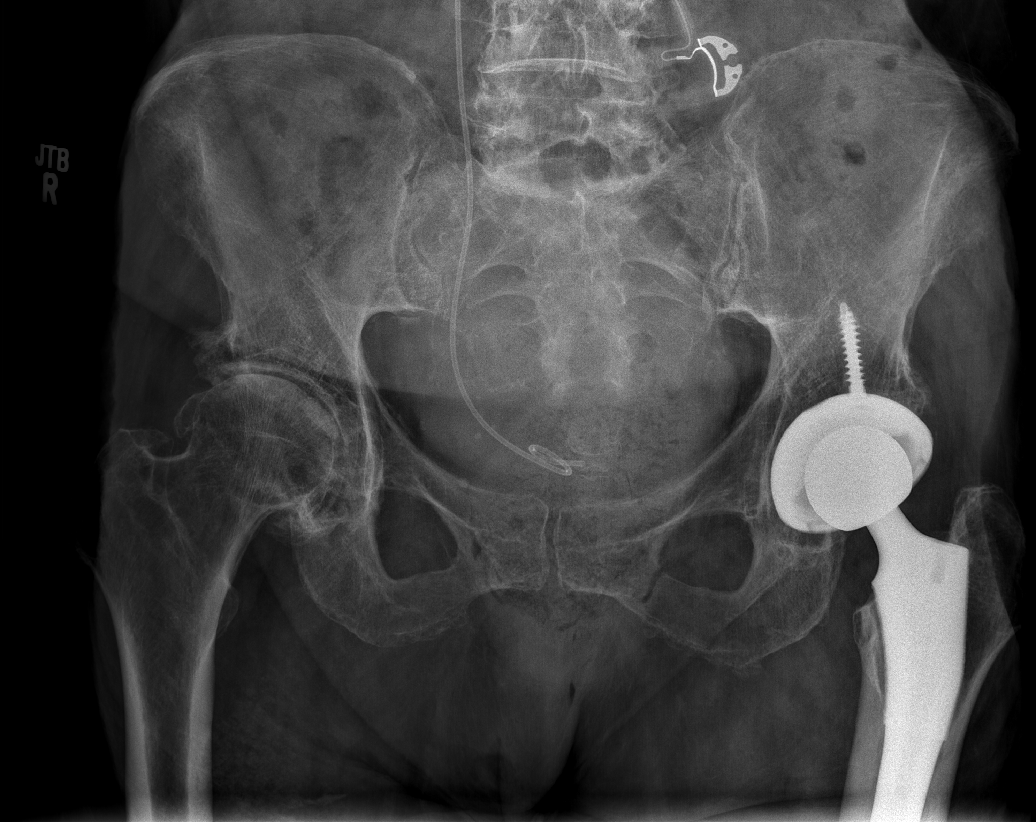

[t hip ap right]
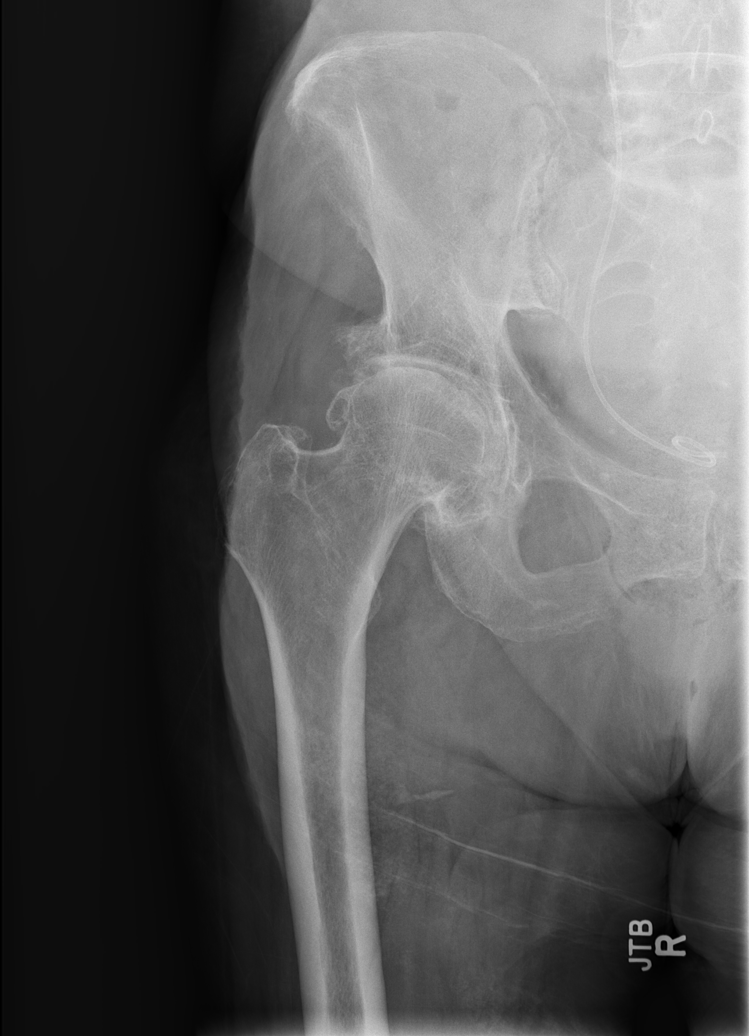

[t hip ap left]
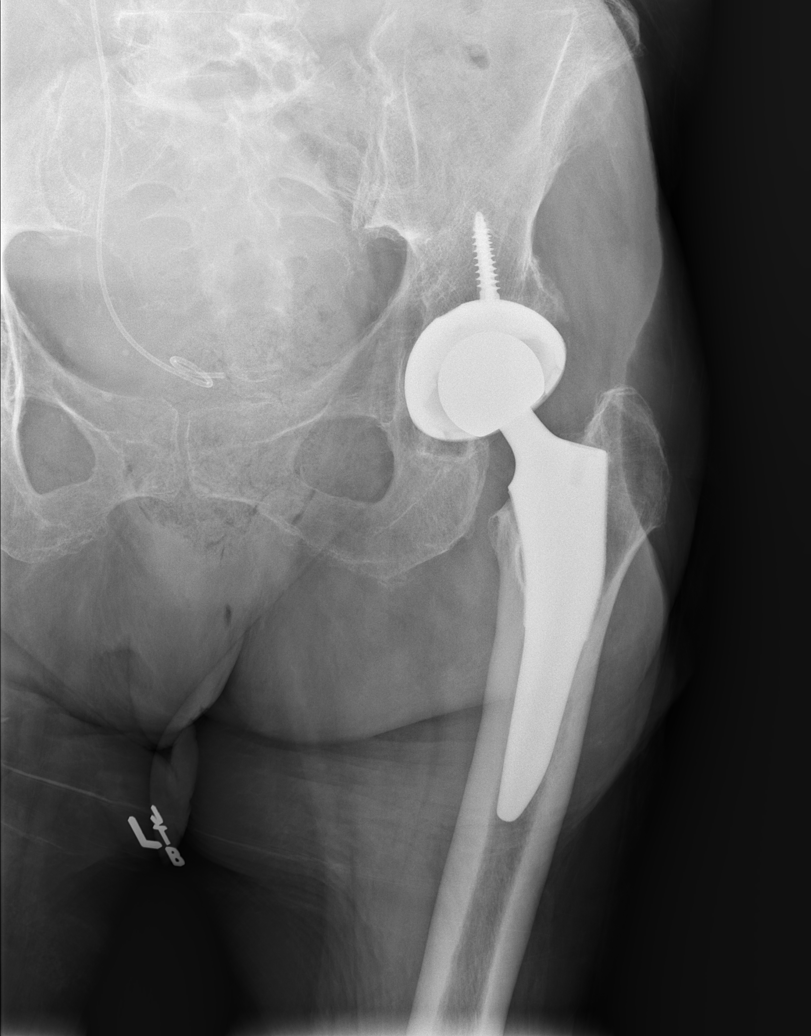

[t hip frog leg left]
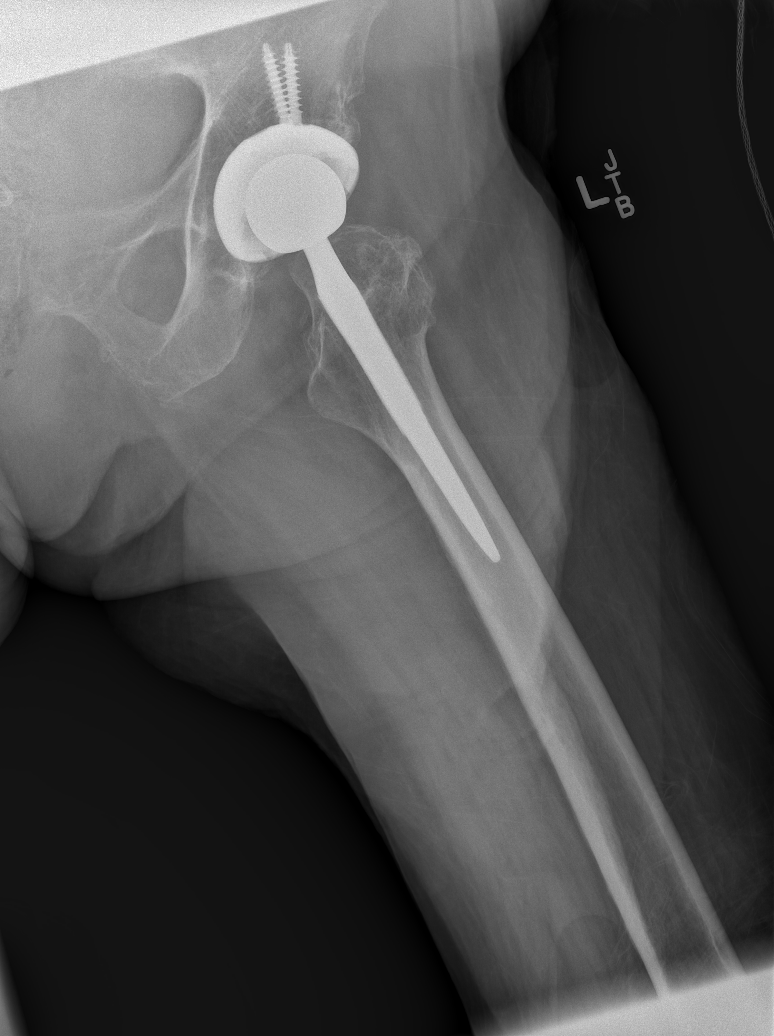

[t hip frog leg right]
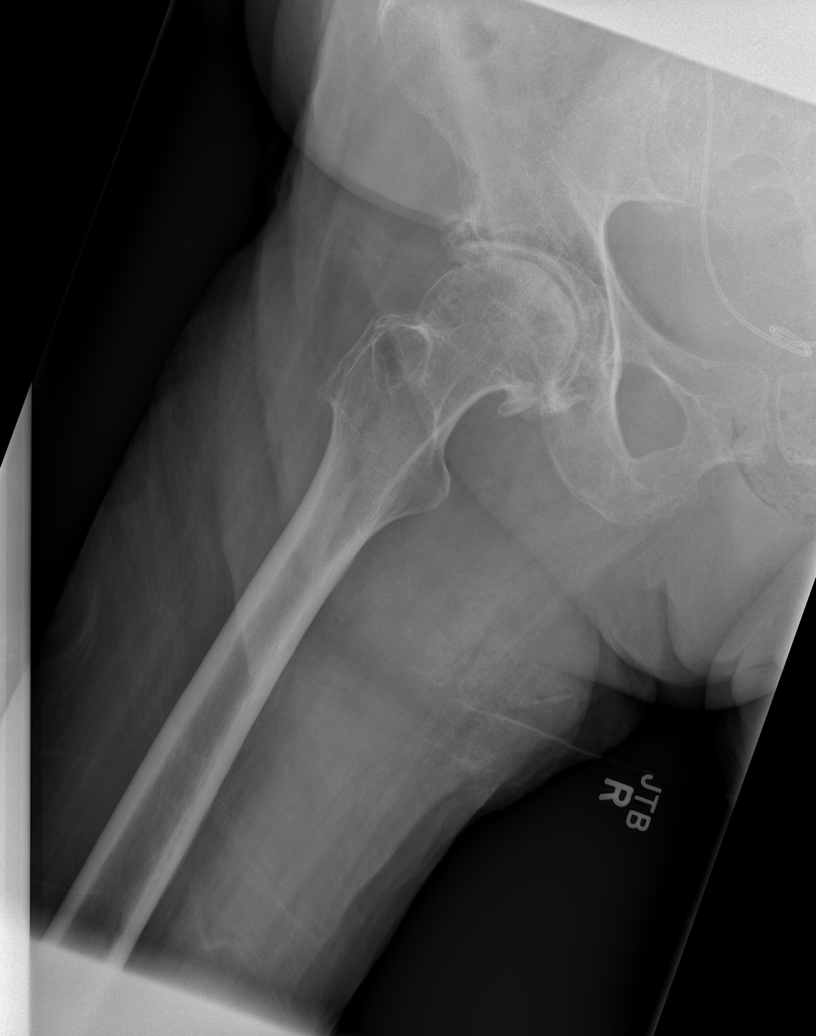

[5 of 5 positions shown; findings below may reference images not displayed]

FINDINGS: Left hip prosthesis is noted. No acute fracture or dislocation is
seen. Significant degenerative changes of right hip joint are noted.
A right ureteral stent is seen.
IMPRESSION: No acute abnormality noted.

## 2016-06-04 IMAGING — CR DG CHEST 1V PORT
1 series · 1 of 1 positions shown · non-contrast
Comparison: 01/19/2015

CLINICAL DATA: Pneumonia

EXAM:
PORTABLE CHEST 1 VIEW

[AP]
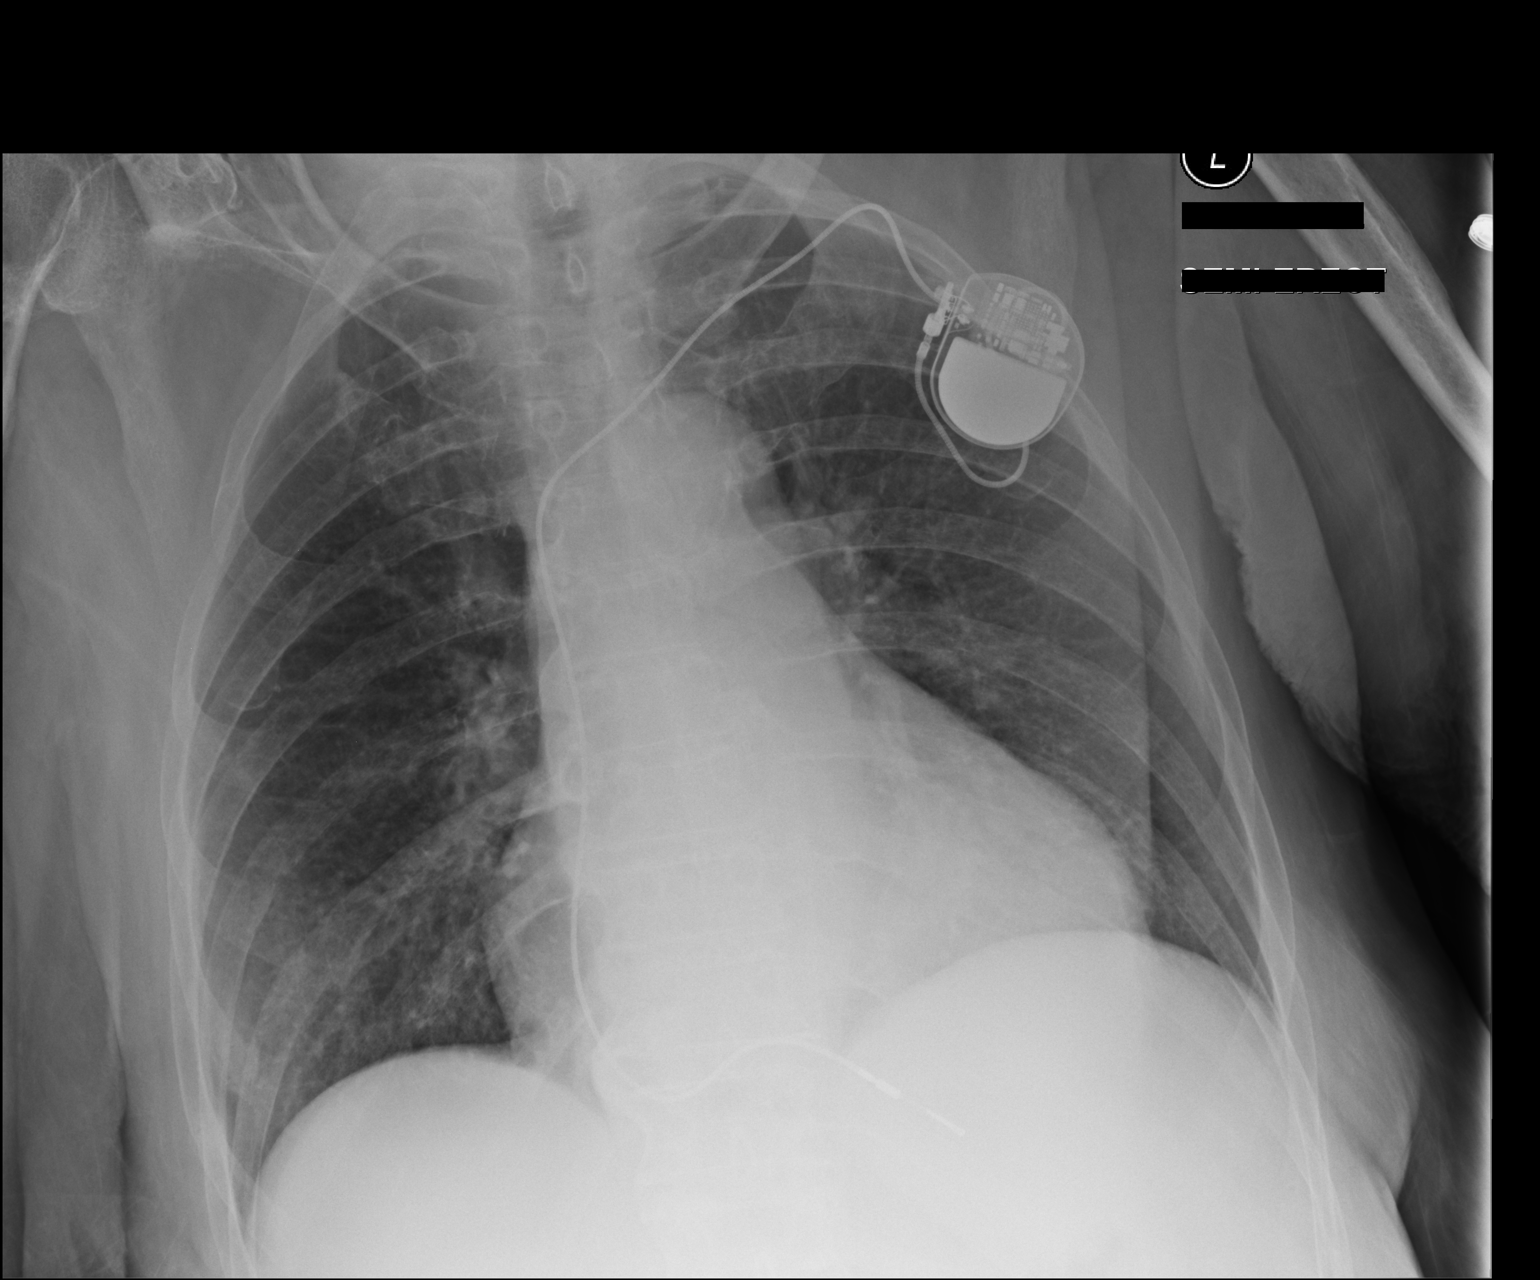

[1 of 1 positions shown; findings below may reference images not displayed]

FINDINGS: Lungs are essentially clear. Mild right lower lobe opacity, likely
atelectasis. No pleural effusion or pneumothorax.

The heart is top-normal in size.  Left subclavian pacemaker.
IMPRESSION: No evidence of acute cardiopulmonary disease.
